# Patient Record
Sex: Female | Born: 1945 | Race: White | Hispanic: No | Marital: Married | Smoking: Never smoker
Health system: Southern US, Community
[De-identification: ages and names within clinical notes are randomized; demographics above are authoritative.]

## PROBLEM LIST (undated history)

## (undated) ENCOUNTER — Emergency Department (HOSPITAL_BASED_OUTPATIENT_CLINIC_OR_DEPARTMENT_OTHER): Admission: EM | Payer: Medicare Other

## (undated) DIAGNOSIS — D649 Anemia, unspecified: Secondary | ICD-10-CM

## (undated) DIAGNOSIS — H409 Unspecified glaucoma: Secondary | ICD-10-CM

## (undated) DIAGNOSIS — E785 Hyperlipidemia, unspecified: Secondary | ICD-10-CM

## (undated) DIAGNOSIS — I10 Essential (primary) hypertension: Secondary | ICD-10-CM

## (undated) DIAGNOSIS — E079 Disorder of thyroid, unspecified: Secondary | ICD-10-CM

## (undated) DIAGNOSIS — D126 Benign neoplasm of colon, unspecified: Secondary | ICD-10-CM

## (undated) DIAGNOSIS — T7840XA Allergy, unspecified, initial encounter: Secondary | ICD-10-CM

## (undated) DIAGNOSIS — Z974 Presence of external hearing-aid: Secondary | ICD-10-CM

## (undated) DIAGNOSIS — N189 Chronic kidney disease, unspecified: Secondary | ICD-10-CM

## (undated) DIAGNOSIS — Z8619 Personal history of other infectious and parasitic diseases: Secondary | ICD-10-CM

## (undated) DIAGNOSIS — R87629 Unspecified abnormal cytological findings in specimens from vagina: Secondary | ICD-10-CM

## (undated) DIAGNOSIS — C801 Malignant (primary) neoplasm, unspecified: Secondary | ICD-10-CM

## (undated) DIAGNOSIS — H903 Sensorineural hearing loss, bilateral: Secondary | ICD-10-CM

## (undated) DIAGNOSIS — M810 Age-related osteoporosis without current pathological fracture: Secondary | ICD-10-CM

## (undated) DIAGNOSIS — M1990 Unspecified osteoarthritis, unspecified site: Secondary | ICD-10-CM

## (undated) DIAGNOSIS — M6530 Trigger finger, unspecified finger: Secondary | ICD-10-CM

## (undated) HISTORY — DX: Benign neoplasm of colon, unspecified: D12.6

## (undated) HISTORY — DX: Age-related osteoporosis without current pathological fracture: M81.0

## (undated) HISTORY — PX: COLPOSCOPY: SHX161

## (undated) HISTORY — DX: Personal history of other infectious and parasitic diseases: Z86.19

## (undated) HISTORY — DX: Unspecified glaucoma: H40.9

## (undated) HISTORY — PX: EYE SURGERY: SHX253

## (undated) HISTORY — DX: Malignant (primary) neoplasm, unspecified: C80.1

## (undated) HISTORY — DX: Trigger finger, unspecified finger: M65.30

## (undated) HISTORY — DX: Allergy, unspecified, initial encounter: T78.40XA

## (undated) HISTORY — DX: Unspecified abnormal cytological findings in specimens from vagina: R87.629

## (undated) HISTORY — DX: Presence of external hearing-aid: Z97.4

## (undated) HISTORY — PX: TUBAL LIGATION: SHX77

## (undated) HISTORY — DX: Unspecified osteoarthritis, unspecified site: M19.90

## (undated) HISTORY — DX: Chronic kidney disease, unspecified: N18.9

## (undated) HISTORY — DX: Hyperlipidemia, unspecified: E78.5

## (undated) HISTORY — DX: Anemia, unspecified: D64.9

## (undated) HISTORY — DX: Sensorineural hearing loss, bilateral: H90.3

## (undated) HISTORY — DX: Essential (primary) hypertension: I10

## (undated) HISTORY — DX: Disorder of thyroid, unspecified: E07.9

---
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
## 1978-02-26 DIAGNOSIS — E079 Disorder of thyroid, unspecified: Secondary | ICD-10-CM

## 1978-02-26 HISTORY — DX: Disorder of thyroid, unspecified: E07.9

## 1988-02-27 HISTORY — PX: BREAST SURGERY: SHX581

## 1993-02-26 DIAGNOSIS — C801 Malignant (primary) neoplasm, unspecified: Secondary | ICD-10-CM

## 1993-02-26 HISTORY — DX: Malignant (primary) neoplasm, unspecified: C80.1

## 2010-06-30 DIAGNOSIS — L280 Lichen simplex chronicus: Secondary | ICD-10-CM | POA: Insufficient documentation

## 2011-07-04 DIAGNOSIS — M129 Arthropathy, unspecified: Secondary | ICD-10-CM | POA: Insufficient documentation

## 2012-01-01 DIAGNOSIS — Z8742 Personal history of other diseases of the female genital tract: Secondary | ICD-10-CM | POA: Insufficient documentation

## 2013-06-17 DIAGNOSIS — H409 Unspecified glaucoma: Secondary | ICD-10-CM | POA: Insufficient documentation

## 2013-06-17 DIAGNOSIS — Z9109 Other allergy status, other than to drugs and biological substances: Secondary | ICD-10-CM | POA: Insufficient documentation

## 2013-06-17 DIAGNOSIS — H9190 Unspecified hearing loss, unspecified ear: Secondary | ICD-10-CM | POA: Insufficient documentation

## 2014-06-16 DIAGNOSIS — H4011X1 Primary open-angle glaucoma, mild stage: Secondary | ICD-10-CM | POA: Diagnosis not present

## 2014-06-28 DIAGNOSIS — J0100 Acute maxillary sinusitis, unspecified: Secondary | ICD-10-CM | POA: Diagnosis not present

## 2014-08-03 DIAGNOSIS — M8580 Other specified disorders of bone density and structure, unspecified site: Secondary | ICD-10-CM | POA: Diagnosis not present

## 2014-08-03 DIAGNOSIS — E782 Mixed hyperlipidemia: Secondary | ICD-10-CM | POA: Diagnosis not present

## 2014-08-03 DIAGNOSIS — M899 Disorder of bone, unspecified: Secondary | ICD-10-CM | POA: Diagnosis not present

## 2014-08-03 DIAGNOSIS — E038 Other specified hypothyroidism: Secondary | ICD-10-CM | POA: Diagnosis not present

## 2014-08-03 DIAGNOSIS — I10 Essential (primary) hypertension: Secondary | ICD-10-CM | POA: Diagnosis not present

## 2014-10-18 DIAGNOSIS — H4011X1 Primary open-angle glaucoma, mild stage: Secondary | ICD-10-CM | POA: Diagnosis not present

## 2015-01-19 DIAGNOSIS — J020 Streptococcal pharyngitis: Secondary | ICD-10-CM | POA: Diagnosis not present

## 2015-02-01 DIAGNOSIS — E559 Vitamin D deficiency, unspecified: Secondary | ICD-10-CM | POA: Diagnosis not present

## 2015-02-01 DIAGNOSIS — E785 Hyperlipidemia, unspecified: Secondary | ICD-10-CM | POA: Diagnosis not present

## 2015-02-01 DIAGNOSIS — E039 Hypothyroidism, unspecified: Secondary | ICD-10-CM | POA: Diagnosis not present

## 2015-02-01 DIAGNOSIS — Z130 Encounter for screening for diseases of the blood and blood-forming organs and certain disorders involving the immune mechanism: Secondary | ICD-10-CM | POA: Diagnosis not present

## 2015-02-01 DIAGNOSIS — I10 Essential (primary) hypertension: Secondary | ICD-10-CM | POA: Diagnosis not present

## 2015-02-08 DIAGNOSIS — I10 Essential (primary) hypertension: Secondary | ICD-10-CM | POA: Diagnosis not present

## 2015-02-08 DIAGNOSIS — H401131 Primary open-angle glaucoma, bilateral, mild stage: Secondary | ICD-10-CM | POA: Diagnosis not present

## 2015-02-08 DIAGNOSIS — E038 Other specified hypothyroidism: Secondary | ICD-10-CM | POA: Diagnosis not present

## 2015-02-08 DIAGNOSIS — E782 Mixed hyperlipidemia: Secondary | ICD-10-CM | POA: Diagnosis not present

## 2015-02-27 DIAGNOSIS — H903 Sensorineural hearing loss, bilateral: Secondary | ICD-10-CM

## 2015-02-27 HISTORY — DX: Sensorineural hearing loss, bilateral: H90.3

## 2015-03-02 ENCOUNTER — Ambulatory Visit (INDEPENDENT_AMBULATORY_CARE_PROVIDER_SITE_OTHER): Payer: Medicare Other | Admitting: Family Medicine

## 2015-03-02 ENCOUNTER — Encounter: Payer: Self-pay | Admitting: Family Medicine

## 2015-03-02 VITALS — BP 147/77 | HR 74 | Temp 98.7°F | Resp 20 | Ht 61.0 in | Wt 123.2 lb

## 2015-03-02 DIAGNOSIS — Z7189 Other specified counseling: Secondary | ICD-10-CM

## 2015-03-02 DIAGNOSIS — I10 Essential (primary) hypertension: Secondary | ICD-10-CM | POA: Diagnosis not present

## 2015-03-02 DIAGNOSIS — Z7689 Persons encountering health services in other specified circumstances: Secondary | ICD-10-CM | POA: Insufficient documentation

## 2015-03-02 DIAGNOSIS — E039 Hypothyroidism, unspecified: Secondary | ICD-10-CM | POA: Insufficient documentation

## 2015-03-02 DIAGNOSIS — E785 Hyperlipidemia, unspecified: Secondary | ICD-10-CM | POA: Diagnosis not present

## 2015-03-02 DIAGNOSIS — E781 Pure hyperglyceridemia: Secondary | ICD-10-CM

## 2015-03-02 DIAGNOSIS — Z639 Problem related to primary support group, unspecified: Secondary | ICD-10-CM | POA: Insufficient documentation

## 2015-03-02 NOTE — Progress Notes (Signed)
Subjective:    Patient ID: Cynthia Powell, female    DOB: 1945/09/18, 70 y.o.   MRN: [id number]  HPI  Patient presents for new patient establishment. All past medical history, surgical history, allergies, family history, immunizations and social history was obtained from the patient today and entered into the electronic medical record. Records are requested from her prior PCP, and will be reviewed at the time they are received. All medical records will be updated at that time.  Patient reports she has a history of hypertension, hyperlipidemia and hypothyroid. She reports compliance with medications for her hypertension, states that she has white coat syndrome. She does not take a statin medication for her hyperlipidemia, she does take krill oil daily. She refuses statin medication. She is curious if fenofibrate would be helpful for her. Her husband is on this medication. She reports compliance with her Synthroid daily. She states all lab work for the ear was completed in December 2016, these records have been requested.  Patient states she does have a family dynamics that causes her some stress. Her youngest daughter has a demyelinating mental condition which can cause her to be difficult to handle at times. She states she has been unstable, unable to hold down jobs in the past. She now lives about a block away from them, in a house that they bought for her. Her daughter does have a young child, age 51, Alexa, take care for quite frequently.  Health maintenance:  Colonoscopy: Dr. Ivin Booty, [facility], h/o polyps; 2014 last completed. Requested records. Mammogram: Patient states up-to-date, she gets one every 2 years, always normal, with the exception of left benign breast milk duct tumor and right breast cyst in the past. Records requested. Cervical cancer screening: 2007, has gyn, does not desire f/u despite h/o of cervical cancer in the past with LEEP.  Immunizations: zoster completed, PNA UTD, FLU UTD,  Tetanus 2011, up-to-date. Infectious disease screening: HIV completed.  Hep C unknown screening.   Past Medical History  Diagnosis Date  . Thyroid disease   . Hypertension   . Hyperlipidemia   . Allergy     seasonal and food  . Arthritis   . Glaucoma    Allergies  Allergen Reactions  . Iodinated Diagnostic Agents     Other reaction(s): UNCONSCIOUSNESS   Past Surgical History  Procedure Laterality Date  . Tonsillectomy    . Cesarean section    . Breast surgery      biopsy  . Colposcopy     Family History  Problem Relation Age of Onset  . Arthritis Mother   . Diabetes Mother   . Cancer Father   . Heart disease Father   . Cancer Sister   . Cancer Daughter   . Diabetes Daughter   . Mental retardation Daughter   . Hearing loss Maternal Grandfather    Social History   Social History  . Marital Status: Married    Spouse Name: N/A  . Number of Children: N/A  . Years of Education: N/A   Occupational History  . Not on file.   Social History Main Topics  . Smoking status: Never Smoker   . Smokeless tobacco: Not on file  . Alcohol Use: No  . Drug Use: No  . Sexual Activity: No   Other Topics Concern  . Not on file   Social History Narrative  . No narrative on file     Review of Systems Negative, with the exception of above mentioned in HPI  Objective:   Physical Exam  BP 147/77 mmHg  Pulse 74  Temp(Src) 98.7 F (37.1 C)  Resp 20  Ht 5\' 1"  (1.549 m)  Wt 123 lb 4 oz (55.906 kg)  BMI 23.30 kg/m2  SpO2 99% Gen: Afebrile. No acute distress. Nontoxic in appearance, well-developed, well-nourished, Caucasian female. Pleasant. HENT: AT. [place]. Bilateral TM visualized and normal in appearance. MMM. Bilateral nares without erythema or swelling. Throat without erythema or exudates. No cough on exam, no hoarseness on exam. Good dentition. Eyes:Pupils Equal Round Reactive to light, Extraocular movements intact,  Conjunctiva without redness, discharge or  icterus. Neck/lymp/endocrine: Supple, no lymphadenopathy, no thyromegaly CV: RRR no murmur, no edema, +2/4 P posterior tibialis pulses Chest: CTAB, no wheeze or crackles. Good air movement, normal respiratory effort Abd: Soft. Flat. NTND. BS present. No Masses palpated.  MSK: No obvious deformities, no joint swelling, no erythema, or range of motion. Skin: No rashes, purpura or petechiae.  Neuro:  Normal gait. PERLA. EOMi. Alert. Oriented x3 Cranial nerves II through XII intact. Muscle strength 5/5 upper and lower extremity.  Psych: Normal affect, dress and demeanor. Normal speech. Normal thought content and judgment     Assessment & Plan:  Cynthia Powell is a 70 y.o. female presents with establishment of care.  Hypothyroidism, unspecified hypothyroidism type - Patient reports all of her lab work which was completed in December, and her TSH was normal. We have requested records from her prior PCP. Continue Synthroid at current dose, patient states she does not need refills at this time.  Essential hypertension, benign - Patient to monitor her blood pressure in the outpatient setting, she states she has white coat syndrome and has elevated blood pressure went to doctors or dentist office. If above 140/80 patient is to return to the office sooner to discuss blood pressure regimen.  Hyperlipidemia/Hypertriglyceridemia - Family history of heart disease in her father - Awaiting patient records, once received will evaluate benefit/dose of fenofibrate. Patient is interested in possibly starting this medication if it will be beneficial. She has been intolerant to statins and refuses to try other formulations. She does take krill oil, have encouraged her to continue taking this medication.  Family dynamics problem: -Patient states she does have a family dynamics problem that causes her some stress. Her youngest daughter has a demyelinating mental condition which can cause her to be difficult to handle at  times. She states she has been unstable, unable to hold down jobs in the past. She now lives about a block away from them, in a house that they bought for her. Her daughter does have a young child, age 51, Alexa, take care for quite frequently.  Home care maintenance colonoscopy: Dr. Ivin Booty, [facility], h/o polyps; 2014 last completed. Requested records. Mammogram: Patient states up-to-date, she gets one every 2 years, always normal, with the exception of left benign breast milk duct tumor and right breast cyst in the past. Records requested. Cervical cancer screening: 2007, has gyn, does not desire f/u despite h/o of cervical cancer in the past with LEEP.  Immunizations: zoster completed, PNA UTD, FLU UTD, Tetanus 2011, up-to-date. Infectious disease screening: HIV completed.  Hep C unknown screening.   - 6 months follow up, unless needed sooner

## 2015-03-02 NOTE — Patient Instructions (Addendum)
Health Maintenance, Female Adopting a healthy lifestyle and getting preventive care can go a long way to promote health and wellness. Talk with your health care provider about what schedule of regular examinations is right for you. This is a good chance for you to check in with your provider about disease prevention and staying healthy. In between checkups, there are plenty of things you can do on your own. Experts have done a lot of research about which lifestyle changes and preventive measures are most likely to keep you healthy. Ask your health care provider for more information. WEIGHT AND DIET  Eat a healthy diet  Be sure to include plenty of vegetables, fruits, low-fat dairy products, and lean protein.  Do not eat a lot of foods high in solid fats, added sugars, or salt.  Get regular exercise. This is one of the most important things you can do for your health.  Most adults should exercise for at least 150 minutes each week. The exercise should increase your heart rate and make you sweat (moderate-intensity exercise).  Most adults should also do strengthening exercises at least twice a week. This is in addition to the moderate-intensity exercise.  Maintain a healthy weight  Body mass index (BMI) is a measurement that can be used to identify possible weight problems. It estimates body fat based on height and weight. Your health care provider can help determine your BMI and help you achieve or maintain a healthy weight.  For females 20 years of age and older:   A BMI below 18.5 is considered underweight.  A BMI of 18.5 to 24.9 is normal.  A BMI of 25 to 29.9 is considered overweight.  A BMI of 30 and above is considered obese.  Watch levels of cholesterol and blood lipids  You should start having your blood tested for lipids and cholesterol at 72 years of age, then have this test every 5 years.  You may need to have your cholesterol levels checked more often if:  Your lipid  or cholesterol levels are high.  You are older than 72 years of age.  You are at high risk for heart disease.  CANCER SCREENING   Lung Cancer  Lung cancer screening is recommended for adults 55-80 years old who are at high risk for lung cancer because of a history of smoking.  A yearly low-dose CT scan of the lungs is recommended for people who:  Currently smoke.  Have quit within the past 15 years.  Have at least a 30-pack-year history of smoking. A pack year is smoking an average of one pack of cigarettes a day for 1 year.  Yearly screening should continue until it has been 15 years since you quit.  Yearly screening should stop if you develop a health problem that would prevent you from having lung cancer treatment.  Breast Cancer  Practice breast self-awareness. This means understanding how your breasts normally appear and feel.  It also means doing regular breast self-exams. Let your health care provider know about any changes, no matter how small.  If you are in your 20s or 30s, you should have a clinical breast exam (CBE) by a health care provider every 1-3 years as part of a regular health exam.  If you are 40 or older, have a CBE every year. Also consider having a breast X-ray (mammogram) every year.  If you have a family history of breast cancer, talk to your health care provider about genetic screening.  If you   are at high risk for breast cancer, talk to your health care provider about having an MRI and a mammogram every year.  Breast cancer gene (BRCA) assessment is recommended for women who have family members with BRCA-related cancers. BRCA-related cancers include:  Breast.  Ovarian.  Tubal.  Peritoneal cancers.  Results of the assessment will determine the need for genetic counseling and BRCA1 and BRCA2 testing. Cervical Cancer Your health care provider may recommend that you be screened regularly for cancer of the pelvic organs (ovaries, uterus, and  vagina). This screening involves a pelvic examination, including checking for microscopic changes to the surface of your cervix (Pap test). You may be encouraged to have this screening done every 3 years, beginning at age 21.  For women ages 30-65, health care providers may recommend pelvic exams and Pap testing every 3 years, or they may recommend the Pap and pelvic exam, combined with testing for human papilloma virus (HPV), every 5 years. Some types of HPV increase your risk of cervical cancer. Testing for HPV may also be done on women of any age with unclear Pap test results.  Other health care providers may not recommend any screening for nonpregnant women who are considered low risk for pelvic cancer and who do not have symptoms. Ask your health care provider if a screening pelvic exam is right for you.  If you have had past treatment for cervical cancer or a condition that could lead to cancer, you need Pap tests and screening for cancer for at least 20 years after your treatment. If Pap tests have been discontinued, your risk factors (such as having a new sexual partner) need to be reassessed to determine if screening should resume. Some women have medical problems that increase the chance of getting cervical cancer. In these cases, your health care provider may recommend more frequent screening and Pap tests. Colorectal Cancer  This type of cancer can be detected and often prevented.  Routine colorectal cancer screening usually begins at 72 years of age and continues through 72 years of age.  Your health care provider may recommend screening at an earlier age if you have risk factors for colon cancer.  Your health care provider may also recommend using home test kits to check for hidden blood in the stool.  A small camera at the end of a tube can be used to examine your colon directly (sigmoidoscopy or colonoscopy). This is done to check for the earliest forms of colorectal  cancer.  Routine screening usually begins at age 50.  Direct examination of the colon should be repeated every 5-10 years through 72 years of age. However, you may need to be screened more often if early forms of precancerous polyps or small growths are found. Skin Cancer  Check your skin from head to toe regularly.  Tell your health care provider about any new moles or changes in moles, especially if there is a change in a mole's shape or color.  Also tell your health care provider if you have a mole that is larger than the size of a pencil eraser.  Always use sunscreen. Apply sunscreen liberally and repeatedly throughout the day.  Protect yourself by wearing long sleeves, pants, a wide-brimmed hat, and sunglasses whenever you are outside. HEART DISEASE, DIABETES, AND HIGH BLOOD PRESSURE   High blood pressure causes heart disease and increases the risk of stroke. High blood pressure is more likely to develop in:  People who have blood pressure in the high end   of the normal range (130-139/85-89 mm Hg).  People who are overweight or obese.  People who are African American.  If you are 38-23 years of age, have your blood pressure checked every 3-5 years. If you are 61 years of age or older, have your blood pressure checked every year. You should have your blood pressure measured twice--once when you are at a hospital or clinic, and once when you are not at a hospital or clinic. Record the average of the two measurements. To check your blood pressure when you are not at a hospital or clinic, you can use:  An automated blood pressure machine at a pharmacy.  A home blood pressure monitor.  If you are between 45 years and 39 years old, ask your health care provider if you should take aspirin to prevent strokes.  Have regular diabetes screenings. This involves taking a blood sample to check your fasting blood sugar level.  If you are at a normal weight and have a low risk for diabetes,  have this test once every three years after 72 years of age.  If you are overweight and have a high risk for diabetes, consider being tested at a younger age or more often. PREVENTING INFECTION  Hepatitis B  If you have a higher risk for hepatitis B, you should be screened for this virus. You are considered at high risk for hepatitis B if:  You were born in a country where hepatitis B is common. Ask your health care provider which countries are considered high risk.  Your parents were born in a high-risk country, and you have not been immunized against hepatitis B (hepatitis B vaccine).  You have HIV or AIDS.  You use needles to inject street drugs.  You live with someone who has hepatitis B.  You have had sex with someone who has hepatitis B.  You get hemodialysis treatment.  You take certain medicines for conditions, including cancer, organ transplantation, and autoimmune conditions. Hepatitis C  Blood testing is recommended for:  Everyone born from [birth year] through 1967.  Anyone with known risk factors for hepatitis C. Sexually transmitted infections (STIs)  You should be screened for sexually transmitted infections (STIs) including gonorrhea and chlamydia if:  You are sexually active and are younger than 72 years of age.  You are older than 72 years of age and your health care provider tells you that you are at risk for this type of infection.  Your sexual activity has changed since you were last screened and you are at an increased risk for chlamydia or gonorrhea. Ask your health care provider if you are at risk.  If you do not have HIV, but are at risk, it may be recommended that you take a prescription medicine daily to prevent HIV infection. This is called pre-exposure prophylaxis (PrEP). You are considered at risk if:  You are sexually active and do not regularly use condoms or know the HIV status of your partner(s).  You take drugs by injection.  You are sexually  active with a partner who has HIV. Talk with your health care provider about whether you are at high risk of being infected with HIV. If you choose to begin PrEP, you should first be tested for HIV. You should then be tested every 3 months for as long as you are taking PrEP.  PREGNANCY   If you are premenopausal and you may become pregnant, ask your health care provider about preconception counseling.  If you may  become pregnant, take 400 to 800 micrograms (mcg) of folic acid every day.  If you want to prevent pregnancy, talk to your health care provider about birth control (contraception). OSTEOPOROSIS AND MENOPAUSE   Osteoporosis is a disease in which the bones lose minerals and strength with aging. This can result in serious bone fractures. Your risk for osteoporosis can be identified using a bone density scan.  If you are 75 years of age or older, or if you are at risk for osteoporosis and fractures, ask your health care provider if you should be screened.  Ask your health care provider whether you should take a calcium or vitamin D supplement to lower your risk for osteoporosis.  Menopause may have certain physical symptoms and risks.  Hormone replacement therapy may reduce some of these symptoms and risks. Talk to your health care provider about whether hormone replacement therapy is right for you.  HOME CARE INSTRUCTIONS   Schedule regular health, dental, and eye exams.  Stay current with your immunizations.   Do not use any tobacco products including cigarettes, chewing tobacco, or electronic cigarettes.  If you are pregnant, do not drink alcohol.  If you are breastfeeding, limit how much and how often you drink alcohol.  Limit alcohol intake to no more than 1 drink per day for nonpregnant women. One drink equals 12 ounces of beer, 5 ounces of wine, or 1 ounces of hard liquor.  Do not use street drugs.  Do not share needles.  Ask your health care provider for help if  you need support or information about quitting drugs.  Tell your health care provider if you often feel depressed.  Tell your health care provider if you have ever been abused or do not feel safe at home.   This information is not intended to replace advice given to you by your health care provider. Make sure you discuss any questions you have with your health care provider.   Document Released: 08/28/2010 Document Revised: 03/05/2014 Document Reviewed: 01/14/2013 Elsevier Interactive Patient Education 2016 [place] Vit d supplement.  6 month follow up for BP

## 2015-03-08 ENCOUNTER — Encounter: Payer: Self-pay | Admitting: Family Medicine

## 2015-03-08 DIAGNOSIS — Z974 Presence of external hearing-aid: Secondary | ICD-10-CM | POA: Insufficient documentation

## 2015-03-08 DIAGNOSIS — M810 Age-related osteoporosis without current pathological fracture: Secondary | ICD-10-CM | POA: Insufficient documentation

## 2015-06-20 DIAGNOSIS — H401131 Primary open-angle glaucoma, bilateral, mild stage: Secondary | ICD-10-CM | POA: Diagnosis not present

## 2015-08-31 ENCOUNTER — Encounter: Payer: Self-pay | Admitting: Family Medicine

## 2015-08-31 ENCOUNTER — Telehealth: Payer: Self-pay | Admitting: Family Medicine

## 2015-08-31 ENCOUNTER — Ambulatory Visit (INDEPENDENT_AMBULATORY_CARE_PROVIDER_SITE_OTHER): Payer: Medicare Other | Admitting: Family Medicine

## 2015-08-31 VITALS — BP 133/81 | HR 78 | Temp 98.5°F | Resp 20 | Ht 61.0 in | Wt 122.8 lb

## 2015-08-31 DIAGNOSIS — E039 Hypothyroidism, unspecified: Secondary | ICD-10-CM | POA: Diagnosis not present

## 2015-08-31 DIAGNOSIS — I10 Essential (primary) hypertension: Secondary | ICD-10-CM | POA: Diagnosis not present

## 2015-08-31 DIAGNOSIS — L609 Nail disorder, unspecified: Secondary | ICD-10-CM | POA: Diagnosis not present

## 2015-08-31 DIAGNOSIS — L608 Other nail disorders: Secondary | ICD-10-CM | POA: Insufficient documentation

## 2015-08-31 LAB — TSH: TSH: 0.46 u[IU]/mL (ref 0.35–4.50)

## 2015-08-31 MED ORDER — LEVOTHYROXINE SODIUM 88 MCG PO TABS: 88.0000 ug | ORAL_TABLET | Freq: Every day | ORAL | Status: DC

## 2015-08-31 MED ORDER — LISINOPRIL 10 MG PO TABS: 10.0000 mg | ORAL_TABLET | Freq: Every day | ORAL | Status: DC

## 2015-08-31 NOTE — Progress Notes (Signed)
Patient ID: Cynthia Powell, female   DOB: 09-12-1945, 70 y.o.   MRN: [id number]   Subjective:    Patient ID: Cynthia Powell, female    DOB: January 27, 1946, 70 y.o.   MRN: [id number]  HPI  Hypertension:Patient reports compliance with her  lisinopril 10 mg QD. She will take BP sporadically at home, especially if having headache, highest reported 138/84. She denies LE, chest pain, or shortness of breath.   Hypothyroid: Pt reports compliance with 88 mcg synthroid daily on an empty stomach. She reports a normal TSH in December at another location, no records have been received. She will be due for refills within the next month.  She denies diarrhea, constipation, flushing or fatigue.   Toe nail changes: Pt states she has noticed her large toenails are yellow. She does not wear nail polish. She denies known trauma/changes in shoes etc prior to changes. She noticed the changes in March (>3 months ago) and has been putting an OTC solution and soak on her feet for fungus. She has  Not noticed any difference or improvement with this treatment. She felt it might had been secondary to her wearing soaks all the time, but she states she always has on clean soaks and she has been trying to go barefoot when able to allow her toenails to breath.  Past Medical History  Diagnosis Date  . Thyroid disease 1980  . Hypertension   . Hyperlipidemia     using Krill oil; refuses statin  . Allergy     seasonal and food  . Arthritis     osteoarthritis  . Glaucoma   . 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 History of chickenpox   . History of shingles     above her right knee, gets frequently.  . Osteoporosis   . Trigger finger   . Benign neoplasm of colon   . Abnormal Pap smear of vagina 1995,2007    pt had h/o abnl pap; does not desire future screening/PAP   Allergies  Allergen Reactions  . Iodinated Diagnostic Agents     Other reaction(s): UNCONSCIOUSNESS   Past Surgical History  Procedure Laterality Date  .  Tonsillectomy  1954  . Cesarean section    . Breast surgery  1990    biopsy  . Colposcopy     Family History  Problem Relation Age of Onset  . Arthritis Mother   . Diabetes Mother   . Cancer Father   . Heart disease Father   . Breast cancer Sister     breast cancer  . Melanoma Daughter     melanoma 2009  . Diabetes Daughter   . Mental retardation Daughter   . Hearing loss Maternal Grandfather   . Stroke Mother   . Hypertension Mother   . Depression Daughter     second daughter  . OCD Daughter     second daughter  . Hypertension Father   . Pernicious anemia Father    Social History   Social History  . Marital Status: Married    Spouse Name: N/A  . Number of Children: N/A  . Years of Education: N/A   Occupational History  . Not on file.   Social History Main Topics  . Smoking status: Never Smoker   . Smokeless tobacco: Not on file  . Alcohol Use: No  . Drug Use: No  . Sexual Activity: No   Other Topics Concern  . Not on file   Social History Narrative  Review of Systems Negative, with the exception of above mentioned in HPI     Objective:   Physical Exam  BP 133/81 mmHg  Pulse 78  Temp(Src) 98.5 F (36.9 C)  Resp 20  Ht 5\' 1"  (1.549 m)  Wt 122 lb 12 oz (55.679 kg)  BMI 23.21 kg/m2  SpO2 99% Gen: Afebrile. No acute distress. Nontoxic in appearance, well-developed, well-nourished, Caucasian female. Pleasant. HENT: AT. [place].  MMM. Eyes:Pupils Equal Round Reactive to light, Extraocular movements intact,  Conjunctiva without redness, discharge or icterus. Neck/lymp/endocrine: Supple, no lymphadenopathy, no thyromegaly CV: RRR no murmur, no edema, +2/4 P posterior tibialis pulses Chest: CTAB, no wheeze or crackles.  Abd: Soft. Flat. NTND. BS present. No Masses palpated.  Skin: No rashes, purpura or petechiae. Bilateral large toenails yellow thick raised, angular deformity R>L at cuticle. No erythema or swelling.  Neuro:  Normal gait. PERLA. EOMi.  Alert. Oriented x3  Psych: Normal affect, dress and demeanor. Normal speech. Normal thought content and judgment    Assessment & Plan:  Cynthia Powell is a 70 y.o. female presents for follow up OV for chronic medical conditions with acute complaint.  Hypothyroidism, unspecified hypothyroidism type - Continue Synthroid at current dose. - TSH collected today, and dose will be adjusted if needed/refills prescribed once resulted  - follow yearly.   Essential hypertension, benign - Stable, controlled today.  - Continue lisinopril, refills provided - low salt diet. Exercise > 150 m a week.  - f/u every 6 months.   Toenail deformity:  - new.  - Bilateral toenail yellow/thickened with deformity.  - ? Fungus vs nailbed injury/nail growth under current toe nail. Has been non-responisve to OTC fungus treatment.  - Podiatry referral.   > 25 minutes spent with patient, >50% of time spent face to face counseling patient and coordinating care.   Electronically Signed by: Howard Pouch, DO [facility]

## 2015-08-31 NOTE — Patient Instructions (Signed)
Preventing Toenail Fungus from Recurring   Sanitize your shoes with Mycomist spray or a similar shoe sanitizer spray.  Follow the instructions on the bottle and dry them outside in the sun or with a hairdryer.  We also recommend repeating the sanitization once weekly in shoes you wear most often.   Throw away any shoes you have worn a significant amount without socks-fungus thrives in a warm moist environment and you want to avoid re-infection after your laser procedure   Bleach your socks with regular or color safe bleach   Change your socks regularly to keep your feet clean and dry (especially if you have sweaty feet)-if sweaty feet are a problem, let your doctor know-there is a great lotion that helps with this problem.   Clean your toenail clippers with alcohol before you use them if you do your own toenails and make sure to replace [employer] and orange sticks regularly   Podiatry referral placed today.  Follow on other acute problems if needed.    If you get regular pedicures, bring your own instruments or go to a spa that sterilizes their instruments in an autoclave.

## 2015-08-31 NOTE — Telephone Encounter (Signed)
Please call pt: Her Thyroid is functioning normal. I have called in refills on her medication.

## 2015-09-01 NOTE — Telephone Encounter (Signed)
Left message with results and information on patient voice mail. 

## 2015-09-12 ENCOUNTER — Ambulatory Visit (INDEPENDENT_AMBULATORY_CARE_PROVIDER_SITE_OTHER): Payer: Medicare Other | Admitting: Family Medicine

## 2015-09-12 ENCOUNTER — Encounter: Payer: Self-pay | Admitting: Family Medicine

## 2015-09-12 VITALS — BP 123/83 | HR 71 | Temp 99.3°F | Resp 20 | Ht 61.0 in | Wt 122.8 lb

## 2015-09-12 DIAGNOSIS — I8393 Asymptomatic varicose veins of bilateral lower extremities: Secondary | ICD-10-CM | POA: Diagnosis not present

## 2015-09-12 DIAGNOSIS — G479 Sleep disorder, unspecified: Secondary | ICD-10-CM | POA: Diagnosis not present

## 2015-09-12 HISTORY — DX: Asymptomatic varicose veins of bilateral lower extremities: I83.93

## 2015-09-12 NOTE — Patient Instructions (Signed)
Varicose Veins Varicose veins are veins that have become enlarged and twisted. They are usually seen in the legs but can occur in other parts of the body as well. CAUSES This condition is the result of valves in the veins not working properly. Valves in the veins help to return blood from the leg to the heart. If these valves are damaged, blood flows backward and backs up into the veins in the leg near the skin. This causes the veins to become larger. RISK FACTORS People who are on their feet a lot, who are pregnant, or who are overweight are more likely to develop varicose veins. SIGNS AND SYMPTOMS  Bulging, twisted-appearing, bluish veins, most commonly found on the legs.  Leg pain or a feeling of heaviness. These symptoms may be worse at the end of the day.  Leg swelling.  Changes in skin color. DIAGNOSIS A health care provider can usually diagnose varicose veins by examining your legs. Your health care provider may also recommend an ultrasound of your leg veins. TREATMENT Most varicose veins can be treated at home.However, other treatments are available for people who have persistent symptoms or want to improve the cosmetic appearance of the varicose veins. These treatment options include:  Sclerotherapy. A solution is injected into the vein to close it off.  Laser treatment. A laser is used to heat the vein to close it off.  Radiofrequency vein ablation. An electrical current produced by radio waves is used to close off the vein.  Phlebectomy. The vein is surgically removed through small incisions made over the varicose vein.  Vein ligation and stripping. The vein is surgically removed through incisions made over the varicose vein after the vein has been tied (ligated). HOME CARE INSTRUCTIONS  Do not stand or sit in one position for long periods of time. Do not sit with your legs crossed. Rest with your legs raised during the day.  Wear compression stockings as directed by your  health care provider. These stockings help to prevent blood clots and reduce swelling in your legs.  Do not wear other tight, encircling garments around your legs, pelvis, or waist.  Walk as much as possible to increase blood flow.  Raise the foot of your bed at night with 2-inch blocks.  If you get a cut in the skin over the vein and the vein bleeds, lie down with your leg raised and press on it with a clean cloth until the bleeding stops. Then place a bandage (dressing) on the cut. See your health care provider if it continues to bleed. SEEK MEDICAL CARE IF:  The skin around your ankle starts to break down.  You have pain, redness, tenderness, or hard swelling in your leg over a vein.  You are uncomfortable because of leg pain.   This information is not intended to replace advice given to you by your health care provider. Make sure you discuss any questions you have with your health care provider.   Document Released: 11/22/2004 Document Revised: 03/05/2014 Document Reviewed: 06/30/2013 Elsevier Interactive Patient Education 2016 Elsevier Inc.   Try the sleepy time tea to help with falling asleep quicker. If you decide you need a prescribed medication for this condition please make an appt.

## 2015-09-12 NOTE — Progress Notes (Signed)
Patient ID: Cynthia Powell, female   DOB: Nov 24, 1945, 70 y.o.   MRN: [id number]    Cynthia Powell , 09/13/1945, 70 y.o., female MRN: [id number] Patient Care Team    Relationship Specialty Notifications Start End  Ma Hillock, DO PCP - General Family Medicine  03/02/15     CC: Spider veins Subjective: Pt presents for an acute OV with complaints of veins in her legs that she is concerned about. She states they are not painful or worsening. She was just concerned.   Insomnia: pt states she has not been able to fall asleep secondary to the current family situation. She states she is averaging about 5 hours of sleep a night. She states this is a new problem for her, she has always been able to sleep well. She has tried melatonin but it gave her nightmares.   Allergies  Allergen Reactions  . Iodinated Diagnostic Agents     Other reaction(s): UNCONSCIOUSNESS   Social History  Substance Use Topics  . Smoking status: Never Smoker   . Smokeless tobacco: Not on file  . Alcohol Use: No   Past Medical History  Diagnosis Date  . Thyroid disease 1980  . Hypertension   . Hyperlipidemia     using Krill oil; refuses statin  . Allergy     seasonal and food  . Arthritis     osteoarthritis  . Glaucoma   . Cancer ([place]) 1995    cervical (cone bx)  . History of chickenpox   . History of shingles     above her right knee, gets frequently.  . Osteoporosis   . Trigger finger   . Benign neoplasm of colon   . Abnormal Pap smear of vagina 1995,2007    pt had h/o abnl pap; does not desire future screening/PAP   Past Surgical History  Procedure Laterality Date  . Tonsillectomy  1954  . Cesarean section    . Breast surgery  1990    biopsy  . Colposcopy     Family History  Problem Relation Age of Onset  . Arthritis Mother   . Diabetes Mother   . Cancer Father   . Heart disease Father   . Breast cancer Sister     breast cancer  . Melanoma Daughter     melanoma 2009  . Diabetes Daughter   .  Mental retardation Daughter   . Hearing loss Maternal Grandfather   . Stroke Mother   . Hypertension Mother   . Depression Daughter     second daughter  . OCD Daughter     second daughter  . Hypertension Father   . Pernicious anemia Father      Medication List       This list is accurate as of: 09/12/15  1:47 PM.  Always use your most recent med list.               calcium citrate-vitamin D 315-200 MG-UNIT tablet  Commonly known as:  CITRACAL+D  Take 1 tablet by mouth 2 (two) times daily.     cholecalciferol 1000 units tablet  Commonly known as:  VITAMIN D  Take 5,000 Units by mouth daily.     levothyroxine 88 MCG tablet  Commonly known as:  SYNTHROID, LEVOTHROID  Take 1 tablet (88 mcg total) by mouth daily before breakfast.     lisinopril 10 MG tablet  Commonly known as:  PRINIVIL,ZESTRIL  Take 1 tablet (10 mg total) by mouth daily.  LUMIGAN 0.01 % Soln  Generic drug:  bimatoprost     PROBIOTIC ADVANCED PO  Take by mouth.        No results found for this or any previous visit (from the past 24 hour(s)). No results found.   ROS: Negative, with the exception of above mentioned in HPI   Objective:  BP 123/83 mmHg  Pulse 71  Temp(Src) 99.3 F (37.4 C)  Resp 20  Ht 5\' 1"  (1.549 m)  Wt 122 lb 12.8 oz (55.702 kg)  BMI 23.21 kg/m2  SpO2 96% Body mass index is 23.21 kg/(m^2). Gen: Afebrile. No acute distress. Nontoxic in appearance, well developed, well nourished. Very pleasant female.  HENT: AT. [place]. MMM Eyes:Pupils Equal Round Reactive to light, Extraocular movements intact,  Conjunctiva without redness, discharge or icterus. Skin: no rashes, purpura or petechiae. multiple superficial spider veins bilateral LE. No engorged varicositis.  Neuro:Normal gait. PERLA. EOMi. Alert. Oriented x3  Psych: Normal affect, dress and demeanor. Normal speech. Normal thought content and judgment.  Assessment/Plan: Sasheen Lagrassa is a 70 y.o. female present for acute OV  for   Spider veins of both lower extremities - appears to be benign spider veins by exam. Not painful. Discussed treatment is cosmetic. She is not interested in correcting, she just wanted to make certain they were not something she should worry about.  - F/u As needed.   Difficulty sleeping - situation difficulty sleeping with the stress surrounding the custody of her granddaughter.  - She does not desire medication at this time, and has not tolerated melatonin the past.  - Discussed good sleepy hygiene, chamomile/sleepy time teas.  - pt to call if deciding she needs more coverage. - F/U PRN   electronically signed by:  Howard Pouch, DO  [place]

## 2015-09-15 DIAGNOSIS — Z1231 Encounter for screening mammogram for malignant neoplasm of breast: Secondary | ICD-10-CM | POA: Diagnosis not present

## 2015-09-26 DIAGNOSIS — R928 Other abnormal and inconclusive findings on diagnostic imaging of breast: Secondary | ICD-10-CM | POA: Diagnosis not present

## 2015-10-25 ENCOUNTER — Encounter: Payer: Self-pay | Admitting: Family Medicine

## 2015-10-25 ENCOUNTER — Telehealth: Payer: Self-pay | Admitting: Family Medicine

## 2015-10-25 DIAGNOSIS — L608 Other nail disorders: Secondary | ICD-10-CM

## 2015-10-25 NOTE — Telephone Encounter (Signed)
Podiatry referral placed again. originally placed in July.

## 2015-11-03 ENCOUNTER — Ambulatory Visit (INDEPENDENT_AMBULATORY_CARE_PROVIDER_SITE_OTHER): Payer: Medicare Other | Admitting: Podiatry

## 2015-11-03 ENCOUNTER — Encounter: Payer: Self-pay | Admitting: Podiatry

## 2015-11-03 VITALS — BP 150/80 | HR 74 | Resp 14

## 2015-11-03 DIAGNOSIS — M79676 Pain in unspecified toe(s): Secondary | ICD-10-CM | POA: Diagnosis not present

## 2015-11-03 DIAGNOSIS — B351 Tinea unguium: Secondary | ICD-10-CM | POA: Diagnosis not present

## 2015-11-03 NOTE — Progress Notes (Signed)
   Subjective:    Patient ID: Cynthia Powell, female    DOB: July 30, 1945, 70 y.o.   MRN: [id number]  HPI this patient presents the office with chief complaint of thick, discolored great toenails on both feet. She says that they have been thick for months. She says that she is applying a topical to the nails for the last 6 weeks with no improvement. She states the nails are not painful as she walks and wears her shoes. She presents the office today for an evaluation and treatment of these toenails on both great toes    Review of Systems  All other systems reviewed and are negative.      Objective:   Physical Exam GENERAL APPEARANCE: Alert, conversant. Appropriately groomed. No acute distress.  VASCULAR: Pedal pulses are  palpable at  [facility] and PT bilateral.  Capillary refill time is immediate to all digits,  Normal temperature gradient.  Digital hair growth is present bilateral  NEUROLOGIC: sensation is normal to 5.07 monofilament at 5/5 sites bilateral.  Light touch is intact bilateral, Muscle strength normal.  MUSCULOSKELETAL: acceptable muscle strength, tone and stability bilateral.  Intrinsic muscluature intact bilateral.  Rectus appearance of foot and digits noted bilateral.   DERMATOLOGIC: skin color, texture, and turgor are within normal limits.  No preulcerative lesions or ulcers  are seen, no interdigital maceration noted.  No open lesions present.  . No drainage noted.  NAILS  Thick disfigured discolored nails both great toes both feet.         Assessment & Plan:  Onychomycosis  B/L   IE  Debridement of hallux nails.  Discussed further conservative treatment.  RTC 3 months

## 2015-12-14 DIAGNOSIS — H401131 Primary open-angle glaucoma, bilateral, mild stage: Secondary | ICD-10-CM | POA: Diagnosis not present

## 2016-02-02 ENCOUNTER — Ambulatory Visit (INDEPENDENT_AMBULATORY_CARE_PROVIDER_SITE_OTHER): Payer: Medicare Other | Admitting: Podiatry

## 2016-02-02 ENCOUNTER — Telehealth: Payer: Self-pay | Admitting: Family Medicine

## 2016-02-02 ENCOUNTER — Encounter: Payer: Self-pay | Admitting: Podiatry

## 2016-02-02 VITALS — Ht 61.0 in | Wt 122.0 lb

## 2016-02-02 DIAGNOSIS — B351 Tinea unguium: Secondary | ICD-10-CM | POA: Diagnosis not present

## 2016-02-02 DIAGNOSIS — M79676 Pain in unspecified toe(s): Secondary | ICD-10-CM | POA: Diagnosis not present

## 2016-02-02 DIAGNOSIS — H9193 Unspecified hearing loss, bilateral: Secondary | ICD-10-CM

## 2016-02-02 NOTE — Telephone Encounter (Signed)
Patient is requesting referral to [facility]. She is already a patient & has scheduled an appointment in January 2018 however [facility] needs a referral from her PCP.

## 2016-02-02 NOTE — Progress Notes (Signed)
   Subjective:    Patient ID: Cynthia Powell, female    DOB: 1945-03-02, 70 y.o.   MRN: [id number]  HPI this patient presents the office with chief complaint of thick, discolored great toenails on both feet. She says that they have been thick for months.  She states the nails are  painful as she walks and wears her shoes. She presents the office today for an evaluation and treatment of these toenails on both great toes    Review of Systems  All other systems reviewed and are negative.      Objective:   Physical Exam GENERAL APPEARANCE: Alert, conversant. Appropriately groomed. No acute distress.  VASCULAR: Pedal pulses are  palpable at  [facility] and PT bilateral.  Capillary refill time is immediate to all digits,  Normal temperature gradient.  Digital hair growth is present bilateral  NEUROLOGIC: sensation is normal to 5.07 monofilament at 5/5 sites bilateral.  Light touch is intact bilateral, Muscle strength normal.  MUSCULOSKELETAL: acceptable muscle strength, tone and stability bilateral.  Intrinsic muscluature intact bilateral.  Rectus appearance of foot and digits noted bilateral.   DERMATOLOGIC: skin color, texture, and turgor are within normal limits.  No preulcerative lesions or ulcers  are seen, no interdigital maceration noted.  No open lesions present.  . No drainage noted.  NAILS  Thick disfigured discolored nails both great toes both feet.         Assessment & Plan:  Onychomycosis  B/L   IE  Debridement of hallux nails.  Discussed further conservative treatment.  Prescribed  Formula 3. RTC 3 months

## 2016-02-03 NOTE — Telephone Encounter (Signed)
Referral placed for audiology.

## 2016-03-21 DIAGNOSIS — H9312 Tinnitus, left ear: Secondary | ICD-10-CM | POA: Diagnosis not present

## 2016-03-21 DIAGNOSIS — H903 Sensorineural hearing loss, bilateral: Secondary | ICD-10-CM | POA: Diagnosis not present

## 2016-03-23 DIAGNOSIS — H9312 Tinnitus, left ear: Secondary | ICD-10-CM | POA: Diagnosis not present

## 2016-03-23 DIAGNOSIS — H903 Sensorineural hearing loss, bilateral: Secondary | ICD-10-CM | POA: Diagnosis not present

## 2016-03-27 ENCOUNTER — Other Ambulatory Visit: Payer: Self-pay | Admitting: Family Medicine

## 2016-03-27 ENCOUNTER — Encounter: Payer: Self-pay | Admitting: *Deleted

## 2016-04-16 DIAGNOSIS — H401131 Primary open-angle glaucoma, bilateral, mild stage: Secondary | ICD-10-CM | POA: Diagnosis not present

## 2016-05-03 ENCOUNTER — Encounter: Payer: Self-pay | Admitting: Podiatry

## 2016-05-03 ENCOUNTER — Ambulatory Visit (INDEPENDENT_AMBULATORY_CARE_PROVIDER_SITE_OTHER): Payer: Medicare Other | Admitting: Podiatry

## 2016-05-03 DIAGNOSIS — B351 Tinea unguium: Secondary | ICD-10-CM | POA: Diagnosis not present

## 2016-05-03 DIAGNOSIS — M79676 Pain in unspecified toe(s): Secondary | ICD-10-CM | POA: Diagnosis not present

## 2016-05-03 NOTE — Progress Notes (Signed)
   Subjective:    Patient ID: Cynthia Powell, female    DOB: [birth year], 71 y.o.   MRN: [id number]  HPI this patient presents the office with chief complaint of thick, discolored great toenails on both feet. She says that they have been thick for months.  She states the nails are  painful as she walks and wears her shoes. She presents the office today for an evaluation and treatment of these toenails on both great toes    Review of Systems  All other systems reviewed and are negative.      Objective:   Physical Exam GENERAL APPEARANCE: Alert, conversant. Appropriately groomed. No acute distress.  VASCULAR: Pedal pulses are  palpable at  [facility] and PT bilateral.  Capillary refill time is immediate to all digits,  Normal temperature gradient.  Digital hair growth is present bilateral  NEUROLOGIC: sensation is normal to 5.07 monofilament at 5/5 sites bilateral.  Light touch is intact bilateral, Muscle strength normal.  MUSCULOSKELETAL: acceptable muscle strength, tone and stability bilateral.  Intrinsic muscluature intact bilateral.  Rectus appearance of foot and digits noted bilateral.   DERMATOLOGIC: skin color, texture, and turgor are within normal limits.  No preulcerative lesions or ulcers  are seen, no interdigital maceration noted.  No open lesions present.  . No drainage noted.  NAILS  Thick disfigured discolored nails both great toes both feet.         Assessment & Plan:  Onychomycosis  B/L   IE  Debridement of hallux nails.  Discussed further conservative treatment. Marland Kitchen RTC 3 months    Gardiner Barefoot DPM

## 2016-07-09 DIAGNOSIS — H401131 Primary open-angle glaucoma, bilateral, mild stage: Secondary | ICD-10-CM | POA: Diagnosis not present

## 2016-07-26 ENCOUNTER — Ambulatory Visit: Payer: Medicare Other | Admitting: Podiatry

## 2016-08-26 ENCOUNTER — Other Ambulatory Visit: Payer: Self-pay | Admitting: Family Medicine

## 2016-09-13 ENCOUNTER — Encounter: Payer: Self-pay | Admitting: Family Medicine

## 2016-09-13 DIAGNOSIS — H903 Sensorineural hearing loss, bilateral: Secondary | ICD-10-CM | POA: Insufficient documentation

## 2017-01-21 ENCOUNTER — Encounter: Payer: Self-pay | Admitting: Family Medicine

## 2017-01-21 ENCOUNTER — Ambulatory Visit (INDEPENDENT_AMBULATORY_CARE_PROVIDER_SITE_OTHER): Payer: Medicare Other | Admitting: Family Medicine

## 2017-01-21 VITALS — BP 180/77 | HR 80 | Temp 98.8°F | Resp 20 | Ht 61.0 in | Wt 121.0 lb

## 2017-01-21 DIAGNOSIS — E039 Hypothyroidism, unspecified: Secondary | ICD-10-CM

## 2017-01-21 DIAGNOSIS — E782 Mixed hyperlipidemia: Secondary | ICD-10-CM

## 2017-01-21 DIAGNOSIS — I10 Essential (primary) hypertension: Secondary | ICD-10-CM

## 2017-01-21 LAB — CBC WITH DIFFERENTIAL/PLATELET
BASOS ABS: 0.1 10*3/uL (ref 0.0–0.1)
Basophils Relative: 1.2 % (ref 0.0–3.0)
EOS ABS: 0.2 10*3/uL (ref 0.0–0.7)
Eosinophils Relative: 2.4 % (ref 0.0–5.0)
HEMATOCRIT: 40 % (ref 36.0–46.0)
Hemoglobin: 13.3 g/dL (ref 12.0–15.0)
LYMPHS PCT: 31.9 % (ref 12.0–46.0)
Lymphs Abs: 2.8 10*3/uL (ref 0.7–4.0)
MCHC: 33.3 g/dL (ref 30.0–36.0)
MCV: 86.3 fl (ref 78.0–100.0)
Monocytes Absolute: 0.6 10*3/uL (ref 0.1–1.0)
Monocytes Relative: 6.7 % (ref 3.0–12.0)
NEUTROS ABS: 5.1 10*3/uL (ref 1.4–7.7)
Neutrophils Relative %: 57.8 % (ref 43.0–77.0)
PLATELETS: 251 10*3/uL (ref 150.0–400.0)
RBC: 4.64 Mil/uL (ref 3.87–5.11)
RDW: 12.5 % (ref 11.5–15.5)
WBC: 8.8 10*3/uL (ref 4.0–10.5)

## 2017-01-21 LAB — BASIC METABOLIC PANEL
BUN: 19 mg/dL (ref 6–23)
CHLORIDE: 104 meq/L (ref 96–112)
CO2: 28 mEq/L (ref 19–32)
Calcium: 9.9 mg/dL (ref 8.4–10.5)
Creatinine, Ser: 0.78 mg/dL (ref 0.40–1.20)
GFR: 76.84 mL/min (ref >60.00)
Glucose, Bld: 84 mg/dL (ref 70–99)
POTASSIUM: 4.2 meq/L (ref 3.5–5.1)
Sodium: 142 mEq/L (ref 135–145)

## 2017-01-21 LAB — LDL CHOLESTEROL, DIRECT: LDL DIRECT: 143 mg/dL

## 2017-01-21 LAB — LIPID PANEL
CHOL/HDL RATIO: 6
CHOLESTEROL: 229 mg/dL — AB (ref 0–200)
HDL: 38.9 mg/dL — ABNORMAL LOW (ref >39.00)
NonHDL: 190.42
TRIGLYCERIDES: 239 mg/dL — AB (ref 0.0–149.0)
VLDL: 47.8 mg/dL — AB (ref 0.0–40.0)

## 2017-01-21 LAB — HEMOGLOBIN A1C: Hgb A1c MFr Bld: 5.5 % (ref 4.6–6.5)

## 2017-01-21 LAB — TSH: TSH: 1.07 u[IU]/mL (ref 0.35–4.50)

## 2017-01-21 MED ORDER — LISINOPRIL 10 MG PO TABS: 10.0000 mg | ORAL_TABLET | Freq: Every day | ORAL | 0 refills | Status: DC

## 2017-01-21 NOTE — Progress Notes (Signed)
Cynthia Powell , 11-30-1945, 71 y.o., female MRN: [id number] Patient Care Team    Relationship Specialty Notifications Start End  Ma Hillock, DO PCP - General Family Medicine  03/02/15     Chief Complaint  Patient presents with  . Hypothyroidism  . Hypertension     Subjective:  Hypertension:Patient reports she stopped her lisinopril 10 mg QD. She realizes she is not taking care of herself and is focused on the care of her granddaughter. Patient denies chest pain, shortness of breath or lower extremity edema. Pt does not take daily baby ASA. Pt is not prescribed statin (refuses statin). Diet: does not closely monitor.  Exercise: does not routinely exercise.  RF: HTN, HLD, FHX stroke (mother)  Hypothyroid: Pt reports compliance with 88 mcg synthroid daily on an empty stomach. TSH normal 08/31/2015. She denies palpations, flushing, diarrhea or constipation.   Depression screen [facility] 2/9 01/21/2017 03/02/2015  Decreased Interest 0 0  Down, Depressed, Hopeless 0 0  PHQ - 2 Score 0 0    Allergies  Allergen Reactions  . Iodinated Diagnostic Agents     Other reaction(s): UNCONSCIOUSNESS   Social History   Tobacco Use  . Smoking status: Passive Smoke Exposure - Never Smoker  . Smokeless tobacco: Never Used  Substance Use Topics  . Alcohol use: No   Past Medical History:  Diagnosis Date  . Abnormal Pap smear of vagina 1995,2007   pt had h/o abnl pap; does not desire future screening/PAP  . Allergy    seasonal and food  . Arthritis    osteoarthritis  . Benign neoplasm of colon   . Cancer ([place]) 1995   cervical (cone bx)  . Glaucoma   . History of chickenpox   . History of shingles    above her right knee, gets frequently.  Marland Kitchen Hyperlipidemia    using Krill oil; refuses statin  . Hypertension   . Osteoporosis   . Sensory hearing loss, bilateral 2017   Bilateral hearing aids. [facility]  . Thyroid disease 1980  . Trigger finger   . Wears hearing aid in both ears     Past Surgical History:  Procedure Laterality Date  . BREAST SURGERY  1990   biopsy  . CESAREAN SECTION    . COLPOSCOPY    . TONSILLECTOMY  1954   Family History  Problem Relation Age of Onset  . Arthritis Mother   . Diabetes Mother   . Stroke Mother   . Hypertension Mother   . Cancer Father   . Heart disease Father   . Hypertension Father   . Pernicious anemia Father   . Breast cancer Sister        breast cancer  . Melanoma Daughter        melanoma 2009  . Diabetes Daughter   . Mental retardation Daughter   . Hearing loss Maternal Grandfather   . Depression Daughter        second daughter  . OCD Daughter        second daughter   Allergies as of 01/21/2017      Reactions   Iodinated Diagnostic Agents    Other reaction(s): UNCONSCIOUSNESS      Medication List        Accurate as of 01/21/17  8:53 AM. Always use your most recent med list.          cholecalciferol 1000 units tablet Commonly known as:  VITAMIN D Take 5,000 Units by mouth daily.  levothyroxine 88 MCG tablet Commonly known as:  SYNTHROID, LEVOTHROID Take 1 tablet (88 mcg total) by mouth daily before breakfast.   lisinopril 10 MG tablet Commonly known as:  PRINIVIL,ZESTRIL TAKE 1 TABLET DAILY   LUMIGAN 0.01 % Soln Generic drug:  bimatoprost   PROBIOTIC ADVANCED PO Take by mouth.       All past medical history, surgical history, allergies, family history, immunizations andmedications were updated in the EMR today and reviewed under the history and medication portions of their EMR.     ROS: Negative, with the exception of above mentioned in HPI   Objective:  BP (!) 180/77 (BP Location: Right Arm, Patient Position: Sitting, Cuff Size: Normal)   Pulse 80   Temp 98.8 F (37.1 C)   Resp 20   Ht 5\' 1"  (1.549 m)   Wt 121 lb (54.9 kg)   SpO2 97%   BMI 22.86 kg/m  Body mass index is 22.86 kg/m. Gen: Afebrile. No acute distress. Nontoxic in appearance, well developed, well  nourished.  HENT: AT. [place]. MMM, no oral lesions. Eyes:Pupils Equal Round Reactive to light, Extraocular movements intact,  Conjunctiva without redness, discharge or icterus. Neck/lymp/endocrine: Supple,no lymphadenopathy CV: RRR no murmur, no edema Chest: CTAB, no wheeze or crackles. Good air movement, normal resp effort.  Abd: Soft. NTND. BS present. no Masses palpated.  Neuro:  Normal gait. PERLA. EOMi. Alert. Oriented x3   No exam data present No results found. No results found for this or any previous visit (from the past 24 hour(s)).  Assessment/Plan: Cynthia Powell is a 71 y.o. female present for OV for   Hypothyroidism, unspecified hypothyroidism type - TSH completed over 1 year ago, will repeat.  - refills provided after results received. 30d to local pharmacy and 90 d to mail in pharm for 1 year.   Essential hypertension, benign - noncompliant with medications.  - restart lisinopril 10 mg, may need increase dose at follow up.  - CBC, CMP, Lipid, A1c and TSh collected today.  - exercise > 150 minutes a week.  - pt counseled on the importance of taking care of herself. Her mother died of a stroke.  - will advise on cholesterol med and ASA after results. Pt against statin.  - f/u 1-2 weeks with provider.    Reviewed expectations re: course of current medical issues.  Discussed self-management of symptoms.  Outlined signs and symptoms indicating need for more acute intervention.  Patient verbalized understanding and all questions were answered.  Patient received an After-Visit Summary.    No orders of the defined types were placed in this encounter.    Note is dictated utilizing voice recognition software. Although note has been proof read prior to signing, occasional typographical errors still can be missed. If any questions arise, please do not hesitate to call for verification.   electronically signed by:  Howard Pouch, DO  [place]

## 2017-01-21 NOTE — Patient Instructions (Signed)
Restart lisinopril 10 mg a day. Follow up in 1 week with me, and we will adjust medication if needed.     Hypertension Hypertension is another name for high blood pressure. High blood pressure forces your heart to work harder to pump blood. This can cause problems over time. There are two numbers in a blood pressure reading. There is a top number (systolic) over a bottom number (diastolic). It is best to have a blood pressure below 120/80. Healthy choices can help lower your blood pressure. You may need medicine to help lower your blood pressure if:  Your blood pressure cannot be lowered with healthy choices.  Your blood pressure is higher than 130/80.  Follow these instructions at home: Eating and drinking  If directed, follow the DASH eating plan. This diet includes: ? Filling half of your plate at each meal with fruits and vegetables. ? Filling one quarter of your plate at each meal with whole grains. Whole grains include whole wheat pasta, brown rice, and whole grain bread. ? Eating or drinking low-fat dairy products, such as skim milk or low-fat yogurt. ? Filling one quarter of your plate at each meal with low-fat (lean) proteins. Low-fat proteins include fish, skinless chicken, eggs, beans, and tofu. ? Avoiding fatty meat, cured and processed meat, or chicken with skin. ? Avoiding premade or processed food.  Eat less than 1,500 mg of salt (sodium) a day.  Limit alcohol use to no more than 1 drink a day for nonpregnant women and 2 drinks a day for men. One drink equals 12 oz of beer, 5 oz of wine, or 1 oz of hard liquor. Lifestyle  Work with your doctor to stay at a healthy weight or to lose weight. Ask your doctor what the best weight is for you.  Get at least 30 minutes of exercise that causes your heart to beat faster (aerobic exercise) most days of the week. This may include walking, swimming, or biking.  Get at least 30 minutes of exercise that strengthens your muscles  (resistance exercise) at least 3 days a week. This may include lifting weights or pilates.  Do not use any products that contain nicotine or tobacco. This includes cigarettes and e-cigarettes. If you need help quitting, ask your doctor.  Check your blood pressure at home as told by your doctor.  Keep all follow-up visits as told by your doctor. This is important. Medicines  Take over-the-counter and prescription medicines only as told by your doctor. Follow directions carefully.  Do not skip doses of blood pressure medicine. The medicine does not work as well if you skip doses. Skipping doses also puts you at risk for problems.  Ask your doctor about side effects or reactions to medicines that you should watch for. Contact a doctor if:  You think you are having a reaction to the medicine you are taking.  You have headaches that keep coming back (recurring).  You feel dizzy.  You have swelling in your ankles.  You have trouble with your vision. Get help right away if:  You get a very bad headache.  You start to feel confused.  You feel weak or numb.  You feel faint.  You get very bad pain in your: ? Chest. ? Belly (abdomen).  You throw up (vomit) more than once.  You have trouble breathing. Summary  Hypertension is another name for high blood pressure.  Making healthy choices can help lower blood pressure. If your blood pressure cannot be controlled  with healthy choices, you may need to take medicine. This information is not intended to replace advice given to you by your health care provider. Make sure you discuss any questions you have with your health care provider. Document Released: 08/01/2007 Document Revised: 01/11/2016 Document Reviewed: 01/11/2016 Elsevier Interactive Patient Education  [employer].   Please help [place] help you:  We are honored you have chosen [facility] for your Primary Care home. Below you will find basic instructions that you  may need to access in the future. Please help [place] help you by reading the instructions, which cover many of the frequent questions we experience.   Prescription refills and request:  -In order to allow more efficient response time, please call your pharmacy for all refills. They will forward the request electronically to [place]. This allows for the quickest possible response. Request left on a nurse line can take longer to refill, since these are checked as time allows between office patients and other phone calls.  - refill request can take up to 3-5 working days to complete.  - If request is sent electronically and request is appropiate, it is usually completed in 1-2 business days.  - all patients will need to be seen routinely for all chronic medical conditions requiring prescription medications (see follow-up below). If you are overdue for follow up on your condition, you will be asked to make an appointment and we will call in enough medication to cover you until your appointment (up to 30 days).  - all controlled substances will require a face to face visit to request/refill.  - if you desire your prescriptions to go through a new pharmacy, and have an active script at original pharmacy, you will need to call your pharmacy and have scripts transferred to new pharmacy. This is completed between the pharmacy locations and not by your provider.    Results: If any images or labs were ordered, it can take up to 1 week to get results depending on the test ordered and the lab/facility running and resulting the test. - Normal or stable results, which do not need further discussion, may be released to your mychart immediately with attached note to you. A call may not be generated for normal results. Please make certain to sign up for mychart. If you have questions on how to activate your mychart you can call the front office.  - If your results need further discussion, our office will attempt to contact you  via phone, and if unable to reach you after 2 attempts, we will release your abnormal result to your mychart with instructions.  - All results will be automatically released in mychart after 1 week.  - Your provider will provide you with explanation and instruction on all relevant material in your results. Please keep in mind, results and labs may appear confusing or abnormal to the untrained eye, but it does not mean they are actually abnormal for you personally. If you have any questions about your results that are not covered, or you desire more detailed explanation than what was provided, you should make an appointment with your provider to do so.   Our office handles many outgoing and incoming calls daily. If we have not contacted you within 1 week about your results, please check your mychart to see if there is a message first and if not, then contact our office.  In helping with this matter, you help decrease call volume, and therefore allow [place] to  be able to respond to patients needs more efficiently.   Acute office visits (sick visit):  An acute visit is intended for a new problem and are scheduled in shorter time slots to allow schedule openings for patients with new problems. This is the appropriate visit to discuss a new problem. In order to provide you with excellent quality medical care with proper time for you to explain your problem, have an exam and receive treatment with instructions, these appointments should be limited to one new problem per visit. If you experience a new problem, in which you desire to be addressed, please make an acute office visit, we save openings on the schedule to accommodate you. Please do not save your new problem for any other type of visit, let us take care of it properly and quickly for you.   Follow up visits:  Depending on your condition(s) your provider will need to see you routinely in order to provide you with quality care and prescribe medication(s).  Most chronic conditions (Example: hypertension, Diabetes, depression/anxiety... etc), require visits a couple times a year. Your provider will instruct you on proper follow up for your personal medical conditions and history. Please make certain to make follow up appointments for your condition as instructed. Failing to do so could result in lapse in your medication treatment/refills. If you request a refill, and are overdue to be seen on a condition, we will always provide you with a 30 day script (once) to allow you time to schedule.    Medicare wellness (well visit): - we have a wonderful Nurse Maudie Mercury), that will meet with you and provide you will yearly medicare wellness visits. These visits should occur yearly (can not be scheduled less than 1 calendar year apart) and cover preventive health, immunizations, advance directives and screenings you are entitled to yearly through your medicare benefits. Do not miss out on your entitled benefits, this is when medicare will pay for these benefits to be ordered for you.  These are strongly encouraged by your provider and is the appropriate type of visit to make certain you are up to date with all preventive health benefits. If you have not had your medicare wellness exam in the last 12 months, please make certain to schedule one by calling the office and schedule your medicare wellness with Maudie Mercury as soon as possible.   Yearly physical (well visit):  - Adults are recommended to be seen yearly for physicals. Check with your insurance and date of your last physical, most insurances require one calendar year between physicals. Physicals include all preventive health topics, screenings, medical exam and labs that are appropriate for gender/age and history. You may have fasting labs needed at this visit. This is a well visit (not a sick visit), new problems should not be covered during this visit (see acute visit).  - Pediatric patients are seen more frequently when they  are younger. Your provider will advise you on well child visit timing that is appropriate for your their age. - This is not a medicare wellness visit. Medicare wellness exams do not have an exam portion to the visit. Some medicare companies allow for a physical, some do not allow a yearly physical. If your medicare allows a yearly physical you can schedule the medicare wellness with our nurse Maudie Mercury and have your physical with your provider after, on the same day. Please check with insurance for your full benefits.   Late Policy/No Shows:  - all new patients should arrive  15-30 minutes earlier than appointment to allow [place] time  to  obtain all personal demographics,  insurance information and for you to complete office paperwork. - All established patients should arrive 10-15 minutes earlier than appointment time to update all information and be checked in .  - In our best efforts to run on time, if you are late for your appointment you will be asked to either reschedule or if able, we will work you back into the schedule. There will be a wait time to work you back in the schedule,  depending on availability.  - If you are unable to make it to your appointment as scheduled, please call 24 hours ahead of time to allow [place] to fill the time slot with someone else who needs to be seen. If you do not cancel your appointment ahead of time, you may be charged a no show fee.

## 2017-01-22 ENCOUNTER — Telehealth: Payer: Self-pay | Admitting: Family Medicine

## 2017-01-22 DIAGNOSIS — E781 Pure hyperglyceridemia: Secondary | ICD-10-CM

## 2017-01-22 MED ORDER — LEVOTHYROXINE SODIUM 88 MCG PO TABS: 88.0000 ug | ORAL_TABLET | Freq: Every day | ORAL | 0 refills | Status: DC

## 2017-01-22 MED ORDER — LEVOTHYROXINE SODIUM 88 MCG PO TABS: 88.0000 ug | ORAL_TABLET | Freq: Every day | ORAL | 3 refills | Status: DC

## 2017-01-22 MED ORDER — FENOFIBRATE 145 MG PO TABS: 145.0000 mg | ORAL_TABLET | Freq: Every day | ORAL | 2 refills | Status: DC

## 2017-01-22 NOTE — Telephone Encounter (Signed)
Please inform patient and I have refilled her thyroid medication with a 30 day supply to the local pharmacy and that he 90 day supply [facility]. - Her labs all look great with the exception of her cholesterol. Her triglycerides are elevated. We had briefly discussed the use of a fenofibrate during her office visit. She had declined use of a statin but was agreeable to fenofibrate. If she is still interested I would like to start this medication for her. Please advise.

## 2017-01-22 NOTE — Telephone Encounter (Signed)
Fenofibrate prescribed 30 d with 2 refills. Follow up in 3 months required for provider appt and fasting labs.

## 2017-01-22 NOTE — Telephone Encounter (Signed)
Spoke with patient reviewed lab results and instructions. Patient verbalized understanding. Patient is willing to try fenofibrate she is requesting we send 30 day supply only to local pharmacy.she would like to see if she tolerates it prior to [place] sending an Rx to mail order.

## 2017-01-23 NOTE — Telephone Encounter (Signed)
Spoke with patient reviewed information and instructions . Patient scheduled for appt next week for HTN she will schedule 3 month follow up on Hyperlipidemia then.

## 2017-01-25 NOTE — Progress Notes (Addendum)
Subjective:   Cynthia Powell is a 71 y.o. female who presents for an Initial Medicare Annual Wellness Visit.  Review of Systems    No ROS.  Medicare Wellness Visit. Additional risk factors are reflected in the social history.   Cardiac Risk Factors include: dyslipidemia;advanced age (>15mn, >>87women);hypertension;family history of premature cardiovascular disease    Sleep patterns: Sleeps 6-7 hours. Home Safety/Smoke Alarms: Feels safe in home. Smoke alarms in place.  Living environment; residence and Firearm Safety: Lives with husband and grand-daughter in 1 story home.  Seat Belt Safety/Bike Helmet: Wears seat belt.   Female:   Pap-Last > 5 years.     Mammo-Pt reports 02/27/2015, normal. Ordered today.         Dexa scan-Pt reports > 2 years. Ordered today.         CCS-Colonoscopy 02/27/2012, h/o polyps. Recall 5 years.      Objective:    Today's Vitals   01/28/17 0849  BP: (!) 144/78  Pulse: 85  Resp: 18  Temp: 98.7 F (37.1 C)  TempSrc: Oral  SpO2: 98%  Weight: 120 lb 1.9 oz (54.5 kg)  Height: 5' 1"  (1.549 m)  PainSc: 2    Body mass index is 22.7 kg/m.  Advanced Directives 01/28/2017  Does Patient Have a Medical Advance Directive? Yes  [place]  Copy of [place]? No - copy requested    Current Medications (verified) Outpatient Encounter Medications as of 01/28/2017  Medication Sig  . cholecalciferol (VITAMIN D) 1000 units tablet Take 5,000 Units by mouth once a week.   . fenofibrate (TRICOR) 145 MG tablet Take 1 tablet (145 mg total) by mouth daily.  .Marland Kitchenlevothyroxine (SYNTHROID, LEVOTHROID) 88 MCG tablet Take 1 tablet (88 mcg total) by mouth daily before breakfast.  . lisinopril (PRINIVIL,ZESTRIL) 10 MG tablet Take 1 tablet (10 mg total) by mouth daily.  .Marland KitchenLUMIGAN 0.01 % SOLN   . Omega 3-6-9 Fatty Acids (OMEGA 3-6-9 PO) Take by mouth.  . Zoster Vaccine Adjuvanted ([facility]  injection Inject 0.5 mLs into the muscle once for 1 dose.   No facility-administered encounter medications on file as of 01/28/2017.     Allergies (verified) Iodinated diagnostic agents   History: Past Medical History:  Diagnosis Date  . Abnormal Pap smear of vagina 1995,2007   pt had h/o abnl pap; does not desire future screening/PAP  . Allergy    seasonal and food  . Arthritis    osteoarthritis  . Benign neoplasm of colon   [place]erv[place]  . Glaucoma   . History of chickenpox   . History of shingles    above her right knee, gets frequently.  .Marland KitchenHyperlipidemia    using Krill oil; refuses statin  . Hypertension   . Osteoporosis   . Sensory hearing loss, bilateral 2017   Bilateral hearing aids. [facility]  . Thyroid disease 1980  . Trigger finger   . Wears hearing aid in both ears    Past Surgical History:  Procedure Laterality Date  . BREAST SURGERY  1990   biopsy  . CESAREAN SECTION    . COLPOSCOPY    . TONSILLECTOMY  1954   Family History  Problem Relation Age of Onset  . Arthritis Mother   . Diabetes Mother   . Stroke Mother   . Hypertension Mother   . Cancer Father   . Heart  disease Father   . Hypertension Father   . Pernicious anemia Father   . Breast cancer Sister        breast cancer  . Melanoma Daughter        melanoma 2009  . Diabetes Daughter   . Mental retardation Daughter   . Hearing loss Maternal Grandfather   . Depression Daughter        second daughter  . OCD Daughter        second daughter   Social History   Socioeconomic History  . Marital status: Married    Spouse name: None  . Number of children: None  . Years of education: None  . Highest education level: None  Social Needs  . Financial resource strain: None  . Food insecurity - worry: None  . Food insecurity - inability: None  . Transportation needs - medical: None  . Transportation needs - non-medical: None  Occupational History  . None    Tobacco Use  . Smoking status: Passive Smoke Exposure - Never Smoker  . Smokeless tobacco: Never Used  Substance and Sexual Activity  . Alcohol use: No  . Drug use: No  . Sexual activity: No  Other Topics Concern  . None  Social History Narrative  . None    Tobacco Counseling Counseling given: Not Answered    Activities of Daily Living In your present state of health, do you have any difficulty performing the following activities: 01/28/2017  Hearing? N  Vision? N  Difficulty concentrating or making decisions? N  Walking or climbing stairs? N  Dressing or bathing? N  Doing errands, shopping? N  Preparing Food and eating ? N  Using the Toilet? N  In the past six months, have you accidently leaked urine? N  Do you have problems with loss of bowel control? N  Managing your Medications? N  Managing your Finances? N  Housekeeping or managing your Housekeeping? N  Some recent data might be hidden     Immunizations and Health Maintenance Immunization History  Administered Date(s) Administered  . Influenza Split 12/26/2007, 10/21/2008, 11/15/2011  . Influenza-Unspecified 11/20/2013, 10/20/2014, 11/22/2016  . MMR 09/23/2012, 10/22/2012  . Pneumococcal Conjugate-13 12/31/2011  . Pneumococcal Polysaccharide-23 11/05/2006  . Tdap 06/20/2009  . Zoster 06/25/2013   Health Maintenance Due  Topic Date Due  . MAMMOGRAM  07/24/1993  . PNA vac Low Risk Adult (2 of 2 - PPSV23) 12/30/2012    Patient Care Team: Ma Hillock, DO as PCP - General (Family Medicine)  Indicate any recent Medical Services you may have received from other than [facility] providers in the past year (date may be approximate).     Assessment:   This is a routine wellness examination for Cynthia Powell. Physical assessment deferred to PCP.   Hearing/Vision screen Hearing Screening Comments: Hearing aids bilaterally.  Vision Screening Comments: Last exam < 1 years. Quarterly for glaucoma. [facility].   Wears glasses. H/O cataract surgery.   Dietary issues and exercise activities discussed: Current Exercise Habits: The patient does not participate in regular exercise at present(Housework), Exercise limited by: None identified   Diet (meal preparation, eat out, water intake, caffeinated beverages, dairy products, fruits and vegetables): Drinks tea and water.   Eats heart healthy diet.   Goals    . Patient Stated     Maintain current health.      Depression Screen PHQ 2/9 Scores 01/28/2017 01/21/2017 03/02/2015  PHQ - 2 Score 0 0 0  Fall Risk Fall Risk  01/28/2017 01/21/2017 03/02/2015  Falls in the past year? No No No    Cognitive Function: MMSE - Mini Mental State Exam 01/28/2017  Orientation to time 5  Orientation to Place 5  Registration 3  Attention/ Calculation 5  Recall 3  Language- name 2 objects 2  Language- repeat 1  Language- follow 3 step command 3  Language- read & follow direction 1  Write a sentence 1  Copy design 1  Total score 30        Screening Tests Health Maintenance  Topic Date Due  . MAMMOGRAM  07/24/1993  . PNA vac Low Risk Adult (2 of 2 - PPSV23) 12/30/2012  . Hepatitis C Screening  01/28/2018 (Originally [birth year])  . COLONOSCOPY  02/26/2017  . TETANUS/TDAP  06/21/2019  . INFLUENZA VACCINE  Completed  . DEXA SCAN  Completed       Plan:     Schedule mammogram and bone scan.  Shingles vaccine at pharmacy.   Bring a copy of your living will and/or healthcare power of attorney to your next office visit.  Continue doing brain stimulating activities (puzzles, reading, adult coloring books, staying active) to keep memory sharp.   I have personally reviewed and noted the following in the patient's chart:   . Medical and social history . Use of alcohol, tobacco or illicit drugs  . Current medications and supplements . Functional ability and status . Nutritional status . Physical activity . Advanced directives . List of other  physicians . Hospitalizations, surgeries, and ER visits in previous 12 months . Vitals . Screenings to include cognitive, depression, and falls . Referrals and appointments  In addition, I have reviewed and discussed with patient certain preventive protocols, quality metrics, and best practice recommendations. A written personalized care plan for preventive services as well as general preventive health recommendations were provided to patient.     Gerilyn Nestle, RN   01/28/2017   Medical screening examination/treatment/procedure(s) were performed by non-physician practitioner and as supervising physician I was immediately available for consultation/collaboration.  I agree with above assessment and plan.  Electronically Signed by: Howard Pouch, DO [facility]

## 2017-01-28 ENCOUNTER — Ambulatory Visit: Payer: Medicare Other

## 2017-01-28 ENCOUNTER — Encounter: Payer: Self-pay | Admitting: Family Medicine

## 2017-01-28 ENCOUNTER — Ambulatory Visit (INDEPENDENT_AMBULATORY_CARE_PROVIDER_SITE_OTHER): Payer: Medicare Other | Admitting: Family Medicine

## 2017-01-28 ENCOUNTER — Other Ambulatory Visit: Payer: Self-pay

## 2017-01-28 VITALS — BP 144/78 | HR 85 | Temp 98.7°F | Resp 18 | Ht 61.0 in | Wt 120.1 lb

## 2017-01-28 DIAGNOSIS — E039 Hypothyroidism, unspecified: Secondary | ICD-10-CM | POA: Diagnosis not present

## 2017-01-28 DIAGNOSIS — E781 Pure hyperglyceridemia: Secondary | ICD-10-CM

## 2017-01-28 DIAGNOSIS — Z0000 Encounter for general adult medical examination without abnormal findings: Secondary | ICD-10-CM | POA: Diagnosis not present

## 2017-01-28 DIAGNOSIS — I10 Essential (primary) hypertension: Secondary | ICD-10-CM

## 2017-01-28 DIAGNOSIS — Z23 Encounter for immunization: Secondary | ICD-10-CM | POA: Diagnosis not present

## 2017-01-28 DIAGNOSIS — Z1231 Encounter for screening mammogram for malignant neoplasm of breast: Secondary | ICD-10-CM

## 2017-01-28 DIAGNOSIS — E2839 Other primary ovarian failure: Secondary | ICD-10-CM

## 2017-01-28 DIAGNOSIS — Z1239 Encounter for other screening for malignant neoplasm of breast: Secondary | ICD-10-CM

## 2017-01-28 MED ORDER — LISINOPRIL 20 MG PO TABS: 20.0000 mg | ORAL_TABLET | Freq: Every day | ORAL | 1 refills | Status: DC

## 2017-01-28 MED ORDER — ZOSTER VAC RECOMB ADJUVANTED 50 MCG/0.5ML IM SUSR: 0.5000 mL | Freq: Once | INTRAMUSCULAR | 1 refills | Status: AC

## 2017-01-28 NOTE — Progress Notes (Signed)
Cynthia Powell , 03-04-1945, 71 y.o., female MRN: [id number] Patient Care Team    Relationship Specialty Notifications Start End  Ma Hillock, DO PCP - General Family Medicine  03/02/15     Chief Complaint  Patient presents with  . Medicare Wellness  . Hypertension     Subjective:  Hypertension/hyperlipidemia:Patient reports she is now taking lisinopril 10 mg Qd. Patient denies chest pain, shortness of breath, dizziness or lower extremity edema.   Pt does not take daily baby ASA. Pt is not prescribed statin (refuses statin). She has started her fenofibrate and is tolerating.  Diet: does not closely monitor.  Exercise: does not routinely exercise.  RF: HTN, HLD, FHX stroke (mother)  Hypothyroid: Pt reports compliance with 88 mcg synthroid daily on an empty stomach. TSH normal 08/31/2015. She denies palpations, flushing, diarrhea or constipation.   Depression screen [facility] 2/9 01/28/2017 01/21/2017 03/02/2015  Decreased Interest 0 0 0  Down, Depressed, Hopeless 0 0 0  PHQ - 2 Score 0 0 0    Allergies  Allergen Reactions  . Iodinated Diagnostic Agents     Other reaction(s): UNCONSCIOUSNESS   Social History   Tobacco Use  . Smoking status: Passive Smoke Exposure - Never Smoker  . Smokeless tobacco: Never Used  Substance Use Topics  . Alcohol use: No   Past Medical History:  Diagnosis Date  . Abnormal Pap smear of vagina 1995,2007   pt had h/o abnl pap; does not desire future screening/PAP  . Allergy    seasonal and food  . Arthritis    osteoarthritis  . Benign neoplasm of colon   . 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 Glaucoma   . History of chickenpox   . History of shingles    above her right knee, gets frequently.  Marland Kitchen Hyperlipidemia    using Krill oil; refuses statin  . Hypertension   . Osteoporosis   . Sensory hearing loss, bilateral 2017   Bilateral hearing aids. [facility]  . Thyroid disease 1980  . Trigger finger   . Wears hearing aid in both ears      Past Surgical History:  Procedure Laterality Date  . BREAST SURGERY  1990   biopsy  . CESAREAN SECTION    . COLPOSCOPY    . TONSILLECTOMY  1954   Family History  Problem Relation Age of Onset  . Arthritis Mother   . Diabetes Mother   . Stroke Mother   . Hypertension Mother   . Cancer Father   . Heart disease Father   . Hypertension Father   . Pernicious anemia Father   . Breast cancer Sister        breast cancer  . Melanoma Daughter        melanoma 2009  . Diabetes Daughter   . Mental retardation Daughter   . Hearing loss Maternal Grandfather   . Depression Daughter        second daughter  . OCD Daughter        second daughter   Allergies as of 01/28/2017      Reactions   Iodinated Diagnostic Agents    Other reaction(s): UNCONSCIOUSNESS      Medication List        Accurate as of 01/28/17  9:32 AM. Always use your most recent med list.          cholecalciferol 1000 units tablet Commonly known as:  VITAMIN D Take 5,000 Units by mouth once  a week.   fenofibrate 145 MG tablet Commonly known as:  TRICOR Take 1 tablet (145 mg total) by mouth daily.   levothyroxine 88 MCG tablet Commonly known as:  SYNTHROID, LEVOTHROID Take 1 tablet (88 mcg total) by mouth daily before breakfast.   lisinopril 10 MG tablet Commonly known as:  PRINIVIL,ZESTRIL Take 1 tablet (10 mg total) by mouth daily.   LUMIGAN 0.01 % Soln Generic drug:  bimatoprost   OMEGA 3-6-9 PO Take by mouth.   Zoster Vaccine Adjuvanted injection Commonly known as:  SHINGRIX Inject 0.5 mLs into the muscle once for 1 dose.       All past medical history, surgical history, allergies, family history, immunizations andmedications were updated in the EMR today and reviewed under the history and medication portions of their EMR.     ROS: Negative, with the exception of above mentioned in HPI   Objective:  BP (!) 144/78 (BP Location: Left Arm, Patient Position: Sitting, Cuff Size: Normal)    Pulse 85   Temp 98.7 F (37.1 C) (Oral)   Resp 18   Ht 5\' 1"  (1.549 m)   Wt 120 lb 1.9 oz (54.5 kg)   SpO2 98%   BMI 22.70 kg/m  Body mass index is 22.7 kg/m.  Gen: Afebrile. No acute distress.  HENT: AT. [place]. MMM.  Eyes:Pupils Equal Round Reactive to light, Extraocular movements intact,  Conjunctiva without redness, discharge or icterus. CV: RRR 1/6 SM, no edema, +2/4 P posterior tibialis pulses Chest: CTAB, no wheeze or crackles Abd: Soft. NTND. BS present.  Neuro: Normal gait. PERLA. EOMi. Alert. Oriented x3   Hearing Screening Comments: Hearing aids bilaterally.  Vision Screening Comments: Last exam < 1 years. Quarterly for glaucoma. [facility].  Wears glasses. H/O cataract surgery.  No results found. No results found for this or any previous visit (from the past 24 hour(s)).  Assessment/Plan: Cynthia Powell is a 71 y.o. female present for OV for   Hypothyroidism, unspecified hypothyroidism type - TSH normal. Refills provided after last visit. - f/u yearly.   Essential hypertension, benign/hypertriglyceridemia - increase lisinopril to 20 mg QD.  - started fenofibrate 1 week ago. (declined statin) - exercise > 150 minutes a week.  - pt counseled on the importance of taking care of herself. Her mother died of a stroke.  - Start ASA 81 mg QD. - 3 months with fasting labs.     Reviewed expectations re: course of current medical issues.  Discussed self-management of symptoms.  Outlined signs and symptoms indicating need for more acute intervention.  Patient verbalized understanding and all questions were answered.  Patient received an After-Visit Summary.    Orders Placed This Encounter  Procedures  . MM Digital Screening  . DG Bone Density  . Pneumococcal polysaccharide vaccine 23-valent greater than or equal to 2yo subcutaneous/IM     Note is dictated utilizing voice recognition software. Although note has been proof read prior to signing, occasional  typographical errors still can be missed. If any questions arise, please do not hesitate to call for verification.   electronically signed by:  Howard Pouch, DO  [place]

## 2017-01-28 NOTE — Patient Instructions (Addendum)
Schedule mammogram and bone scan.  Shingles vaccine at pharmacy.   Bring a copy of your living will and/or healthcare power of attorney to your next office visit.  Continue doing brain stimulating activities (puzzles, reading, adult coloring books, staying active) to keep memory sharp.   Health Maintenance, Female Adopting a healthy lifestyle and getting preventive care can go a long way to promote health and wellness. Talk with your health care provider about what schedule of regular examinations is right for you. This is a good chance for you to check in with your provider about disease prevention and staying healthy. In between checkups, there are plenty of things you can do on your own. Experts have done a lot of research about which lifestyle changes and preventive measures are most likely to keep you healthy. Ask your health care provider for more information. Weight and diet Eat a healthy diet  Be sure to include plenty of vegetables, fruits, low-fat dairy products, and lean protein.  Do not eat a lot of foods high in solid fats, added sugars, or salt.  Get regular exercise. This is one of the most important things you can do for your health. ? Most adults should exercise for at least 150 minutes each week. The exercise should increase your heart rate and make you sweat (moderate-intensity exercise). ? Most adults should also do strengthening exercises at least twice a week. This is in addition to the moderate-intensity exercise.  Maintain a healthy weight  Body mass index (BMI) is a measurement that can be used to identify possible weight problems. It estimates body fat based on height and weight. Your health care provider can help determine your BMI and help you achieve or maintain a healthy weight.  For females 10 years of age and older: ? A BMI below 18.5 is considered underweight. ? A BMI of 18.5 to 24.9 is normal. ? A BMI of 25 to 29.9 is considered overweight. ? A BMI of  30 and above is considered obese.  Watch levels of cholesterol and blood lipids  You should start having your blood tested for lipids and cholesterol at 73 years of age, then have this test every 5 years.  You may need to have your cholesterol levels checked more often if: ? Your lipid or cholesterol levels are high. ? You are older than 73 years of age. ? You are at high risk for heart disease.  Cancer screening Lung Cancer  Lung cancer screening is recommended for adults 50-85 years old who are at high risk for lung cancer because of a history of smoking.  A yearly low-dose CT scan of the lungs is recommended for people who: ? Currently smoke. ? Have quit within the past 15 years. ? Have at least a 30-pack-year history of smoking. A pack year is smoking an average of one pack of cigarettes a day for 1 year.  Yearly screening should continue until it has been 15 years since you quit.  Yearly screening should stop if you develop a health problem that would prevent you from having lung cancer treatment.  Breast Cancer  Practice breast self-awareness. This means understanding how your breasts normally appear and feel.  It also means doing regular breast self-exams. Let your health care provider know about any changes, no matter how small.  If you are in your 20s or 30s, you should have a clinical breast exam (CBE) by a health care provider every 1-3 years as part of a regular health  exam.  If you are 40 or older, have a CBE every year. Also consider having a breast X-ray (mammogram) every year.  If you have a family history of breast cancer, talk to your health care provider about genetic screening.  If you are at high risk for breast cancer, talk to your health care provider about having an MRI and a mammogram every year.  Breast cancer gene (BRCA) assessment is recommended for women who have family members with BRCA-related cancers. BRCA-related cancers  include: ? Breast. ? Ovarian. ? Tubal. ? Peritoneal cancers.  Results of the assessment will determine the need for genetic counseling and BRCA1 and BRCA2 testing.  Cervical Cancer Your health care provider may recommend that you be screened regularly for cancer of the pelvic organs (ovaries, uterus, and vagina). This screening involves a pelvic examination, including checking for microscopic changes to the surface of your cervix (Pap test). You may be encouraged to have this screening done every 3 years, beginning at age 55.  For women ages 59-65, health care providers may recommend pelvic exams and Pap testing every 3 years, or they may recommend the Pap and pelvic exam, combined with testing for human papilloma virus (HPV), every 5 years. Some types of HPV increase your risk of cervical cancer. Testing for HPV may also be done on women of any age with unclear Pap test results.  Other health care providers may not recommend any screening for nonpregnant women who are considered low risk for pelvic cancer and who do not have symptoms. Ask your health care provider if a screening pelvic exam is right for you.  If you have had past treatment for cervical cancer or a condition that could lead to cancer, you need Pap tests and screening for cancer for at least 20 years after your treatment. If Pap tests have been discontinued, your risk factors (such as having a new sexual partner) need to be reassessed to determine if screening should resume. Some women have medical problems that increase the chance of getting cervical cancer. In these cases, your health care provider may recommend more frequent screening and Pap tests.  Colorectal Cancer  This type of cancer can be detected and often prevented.  Routine colorectal cancer screening usually begins at 73 years of age and continues through 73 years of age.  Your health care provider may recommend screening at an earlier age if you have risk factors  for colon cancer.  Your health care provider may also recommend using home test kits to check for hidden blood in the stool.  A small camera at the end of a tube can be used to examine your colon directly (sigmoidoscopy or colonoscopy). This is done to check for the earliest forms of colorectal cancer.  Routine screening usually begins at age 75.  Direct examination of the colon should be repeated every 5-10 years through 73 years of age. However, you may need to be screened more often if early forms of precancerous polyps or small growths are found.  Skin Cancer  Check your skin from head to toe regularly.  Tell your health care provider about any new moles or changes in moles, especially if there is a change in a mole's shape or color.  Also tell your health care provider if you have a mole that is larger than the size of a pencil eraser.  Always use sunscreen. Apply sunscreen liberally and repeatedly throughout the day.  Protect yourself by wearing long sleeves, pants, a wide-brimmed  hat, and sunglasses whenever you are outside.  Heart disease, diabetes, and high blood pressure  High blood pressure causes heart disease and increases the risk of stroke. High blood pressure is more likely to develop in: ? People who have blood pressure in the high end of the normal range (130-139/85-89 mm Hg). ? People who are overweight or obese. ? People who are African American.  If you are 77-97 years of age, have your blood pressure checked every 3-5 years. If you are 30 years of age or older, have your blood pressure checked every year. You should have your blood pressure measured twice-once when you are at a hospital or clinic, and once when you are not at a hospital or clinic. Record the average of the two measurements. To check your blood pressure when you are not at a hospital or clinic, you can use: ? An automated blood pressure machine at a pharmacy. ? A home blood pressure monitor.  If  you are between 7 years and 48 years old, ask your health care provider if you should take aspirin to prevent strokes.  Have regular diabetes screenings. This involves taking a blood sample to check your fasting blood sugar level. ? If you are at a normal weight and have a low risk for diabetes, have this test once every three years after 73 years of age. ? If you are overweight and have a high risk for diabetes, consider being tested at a younger age or more often. Preventing infection Hepatitis B  If you have a higher risk for hepatitis B, you should be screened for this virus. You are considered at high risk for hepatitis B if: ? You were born in a country where hepatitis B is common. Ask your health care provider which countries are considered high risk. ? Your parents were born in a high-risk country, and you have not been immunized against hepatitis B (hepatitis B vaccine). ? You have HIV or AIDS. ? You use needles to inject street drugs. ? You live with someone who has hepatitis B. ? You have had sex with someone who has hepatitis B. ? You get hemodialysis treatment. ? You take certain medicines for conditions, including cancer, organ transplantation, and autoimmune conditions.  Hepatitis C  Blood testing is recommended for: ? Everyone born from [birth year] through 1967. ? Anyone with known risk factors for hepatitis C.  Sexually transmitted infections (STIs)  You should be screened for sexually transmitted infections (STIs) including gonorrhea and chlamydia if: ? You are sexually active and are younger than 73 years of age. ? You are older than 73 years of age and your health care provider tells you that you are at risk for this type of infection. ? Your sexual activity has changed since you were last screened and you are at an increased risk for chlamydia or gonorrhea. Ask your health care provider if you are at risk.  If you do not have HIV, but are at risk, it may be recommended  that you take a prescription medicine daily to prevent HIV infection. This is called pre-exposure prophylaxis (PrEP). You are considered at risk if: ? You are sexually active and do not regularly use condoms or know the HIV status of your partner(s). ? You take drugs by injection. ? You are sexually active with a partner who has HIV.  Talk with your health care provider about whether you are at high risk of being infected with HIV. If you choose to begin  PrEP, you should first be tested for HIV. You should then be tested every 3 months for as long as you are taking PrEP. Pregnancy  If you are premenopausal and you may become pregnant, ask your health care provider about preconception counseling.  If you may become pregnant, take 400 to 800 micrograms (mcg) of folic acid every day.  If you want to prevent pregnancy, talk to your health care provider about birth control (contraception). Osteoporosis and menopause  Osteoporosis is a disease in which the bones lose minerals and strength with aging. This can result in serious bone fractures. Your risk for osteoporosis can be identified using a bone density scan.  If you are 14 years of age or older, or if you are at risk for osteoporosis and fractures, ask your health care provider if you should be screened.  Ask your health care provider whether you should take a calcium or vitamin D supplement to lower your risk for osteoporosis.  Menopause may have certain physical symptoms and risks.  Hormone replacement therapy may reduce some of these symptoms and risks. Talk to your health care provider about whether hormone replacement therapy is right for you. Follow these instructions at home:  Schedule regular health, dental, and eye exams.  Stay current with your immunizations.  Do not use any tobacco products including cigarettes, chewing tobacco, or electronic cigarettes.  If you are pregnant, do not drink alcohol.  If you are  breastfeeding, limit how much and how often you drink alcohol.  Limit alcohol intake to no more than 1 drink per day for nonpregnant women. One drink equals 12 ounces of beer, 5 ounces of wine, or 1 ounces of hard liquor.  Do not use street drugs.  Do not share needles.  Ask your health care provider for help if you need support or information about quitting drugs.  Tell your health care provider if you often feel depressed.  Tell your health care provider if you have ever been abused or do not feel safe at home. This information is not intended to replace advice given to you by your health care provider. Make sure you discuss any questions you have with your health care provider. Document Released: 08/28/2010 Document Revised: 07/21/2015 Document Reviewed: 11/16/2014 Elsevier Interactive Patient Education  2018 [place].    Increase your lisinopril to 20 mg a day. Take two of the 10 mg tabs until new dose comes in... Then take only 1 tab.   Continue fenofibrate, and follow up in 3 months with fasting labs.

## 2017-01-29 ENCOUNTER — Telehealth: Payer: Self-pay | Admitting: Family Medicine

## 2017-01-29 NOTE — Telephone Encounter (Signed)
Spoke with patient regarding symptoms.  Patient received pneumovax yesterday. Patient reports arm is red, hot and painful under injection site. She is taking Tylenol for pain. Advised to apply cool compress and if arm appears worse or not improving, to call for appointment. Patient verbalized understanding.

## 2017-01-29 NOTE — Telephone Encounter (Signed)
Copied from [place]. Topic: General - Other >> Jan 29, 2017  1:39 PM Marin Olp L wrote: Reason for CRM: Had 2nd dose of pneumonia vaccine yesterday 12/03, now today she has extreme redness, heat, and pain below the site of the injection.

## 2017-03-22 DIAGNOSIS — Z780 Asymptomatic menopausal state: Secondary | ICD-10-CM | POA: Diagnosis not present

## 2017-03-22 DIAGNOSIS — M810 Age-related osteoporosis without current pathological fracture: Secondary | ICD-10-CM | POA: Diagnosis not present

## 2017-03-22 DIAGNOSIS — E2839 Other primary ovarian failure: Secondary | ICD-10-CM | POA: Diagnosis not present

## 2017-03-22 DIAGNOSIS — Z1231 Encounter for screening mammogram for malignant neoplasm of breast: Secondary | ICD-10-CM | POA: Diagnosis not present

## 2017-03-22 LAB — HM DEXA SCAN

## 2017-03-22 LAB — HM MAMMOGRAPHY

## 2017-03-25 ENCOUNTER — Encounter: Payer: Self-pay | Admitting: *Deleted

## 2017-03-25 ENCOUNTER — Telehealth: Payer: Self-pay | Admitting: Family Medicine

## 2017-03-25 ENCOUNTER — Encounter: Payer: Self-pay | Admitting: Family Medicine

## 2017-03-25 DIAGNOSIS — R928 Other abnormal and inconclusive findings on diagnostic imaging of breast: Secondary | ICD-10-CM | POA: Insufficient documentation

## 2017-03-25 NOTE — Telephone Encounter (Signed)
Please call pt:  - her mammogram was normal right breast. They are requesting additional views of the left breast. Although scary, this does not mean she has cancer. They just need additional images to get a better look at an area in her left breast. They will contact her to schedule if they have not already.  - her bone density resulted with osteoporosis (bone softening), which puts her at a higher risk for fracture. Please ask her if she has ever taken a medication called fosamax for her bones?  - If not this is recommended. If she is willing to try medication, I will call it in for her. Weight bearing exercise such as walking 15 minutes daily and adequate vit d/ca intake is also recommended.

## 2017-03-26 NOTE — Telephone Encounter (Signed)
Spoke with patient reviewed mammogram and bone density results ,instructions and information. Patient verbalized understanding. Patient is not willing to take fosamax she states there are too many side effects with prescription medications. she will take vit d and calcium

## 2017-04-01 DIAGNOSIS — N6323 Unspecified lump in the left breast, lower outer quadrant: Secondary | ICD-10-CM | POA: Diagnosis not present

## 2017-04-01 DIAGNOSIS — R928 Other abnormal and inconclusive findings on diagnostic imaging of breast: Secondary | ICD-10-CM | POA: Diagnosis not present

## 2017-04-02 ENCOUNTER — Other Ambulatory Visit: Payer: Self-pay | Admitting: *Deleted

## 2017-04-02 DIAGNOSIS — E781 Pure hyperglyceridemia: Secondary | ICD-10-CM

## 2017-04-02 MED ORDER — FENOFIBRATE 145 MG PO TABS: 145.0000 mg | ORAL_TABLET | Freq: Every day | ORAL | 3 refills | Status: DC

## 2017-04-03 ENCOUNTER — Telehealth: Payer: Self-pay | Admitting: Family Medicine

## 2017-04-03 NOTE — Telephone Encounter (Signed)
Copied from [place] [id number]. Topic: Inquiry >> Apr 03, 2017  3:26 PM Cynthia Powell wrote: Imaging done at [place] 03/22/17 screening, call back 04/01/17, recommended for biopsy and wants done at the [place] [address] order for [name] for the biopsy (if you have reports)? They don't have access in our Epic network. Please advise  L US guided breast biopsy &  L Axillary node biopsy

## 2017-04-03 NOTE — Telephone Encounter (Signed)
Spoke with patient she states she had repeat mammogram on 04/01/17 she will request they send her results. [facility] is setting up her appt for Biopsy at [place] at request of patient she wanted [place] to know we would probably receive orders to sign for this,

## 2017-04-03 NOTE — Telephone Encounter (Signed)
I do not have records indicating biopsy need. Please get the result faxed to [place] and then I can I see about ordering it. The last result I received asked for additional views, not biopsy.

## 2017-04-08 ENCOUNTER — Other Ambulatory Visit: Payer: Self-pay | Admitting: Family Medicine

## 2017-04-08 DIAGNOSIS — R599 Enlarged lymph nodes, unspecified: Secondary | ICD-10-CM

## 2017-04-08 DIAGNOSIS — N6320 Unspecified lump in the left breast, unspecified quadrant: Secondary | ICD-10-CM

## 2017-04-09 ENCOUNTER — Other Ambulatory Visit: Payer: Self-pay

## 2017-04-09 ENCOUNTER — Other Ambulatory Visit: Payer: Self-pay | Admitting: Family Medicine

## 2017-04-09 DIAGNOSIS — R599 Enlarged lymph nodes, unspecified: Secondary | ICD-10-CM

## 2017-04-11 ENCOUNTER — Ambulatory Visit
Admission: RE | Admit: 2017-04-11 | Discharge: 2017-04-11 | Disposition: A | Discharge status: Home / Self Care | Payer: Medicare Other | Source: Ambulatory Visit | Attending: Family Medicine | Admitting: Family Medicine

## 2017-04-11 ENCOUNTER — Other Ambulatory Visit: Payer: Self-pay | Admitting: Family Medicine

## 2017-04-11 DIAGNOSIS — R599 Enlarged lymph nodes, unspecified: Secondary | ICD-10-CM

## 2017-04-11 DIAGNOSIS — N6012 Diffuse cystic mastopathy of left breast: Secondary | ICD-10-CM | POA: Diagnosis not present

## 2017-04-11 DIAGNOSIS — N6320 Unspecified lump in the left breast, unspecified quadrant: Secondary | ICD-10-CM

## 2017-04-11 DIAGNOSIS — N6323 Unspecified lump in the left breast, lower outer quadrant: Secondary | ICD-10-CM | POA: Diagnosis not present

## 2017-04-29 ENCOUNTER — Ambulatory Visit (INDEPENDENT_AMBULATORY_CARE_PROVIDER_SITE_OTHER): Payer: Medicare Other | Admitting: Family Medicine

## 2017-04-29 ENCOUNTER — Encounter: Payer: Self-pay | Admitting: Family Medicine

## 2017-04-29 ENCOUNTER — Telehealth: Payer: Self-pay | Admitting: Family Medicine

## 2017-04-29 VITALS — BP 135/85 | HR 74 | Temp 98.7°F | Resp 18 | Ht 61.0 in | Wt 125.0 lb

## 2017-04-29 DIAGNOSIS — E559 Vitamin D deficiency, unspecified: Secondary | ICD-10-CM

## 2017-04-29 DIAGNOSIS — E782 Mixed hyperlipidemia: Secondary | ICD-10-CM | POA: Diagnosis not present

## 2017-04-29 DIAGNOSIS — I10 Essential (primary) hypertension: Secondary | ICD-10-CM

## 2017-04-29 DIAGNOSIS — E039 Hypothyroidism, unspecified: Secondary | ICD-10-CM | POA: Diagnosis not present

## 2017-04-29 DIAGNOSIS — E781 Pure hyperglyceridemia: Secondary | ICD-10-CM

## 2017-04-29 LAB — COMPREHENSIVE METABOLIC PANEL
ALT: 22 U/L (ref 0–35)
AST: 24 U/L (ref 0–37)
Albumin: 4.2 g/dL (ref 3.5–5.2)
Alkaline Phosphatase: 41 U/L (ref 39–117)
BUN: 33 mg/dL — ABNORMAL HIGH (ref 6–23)
CALCIUM: 10 mg/dL (ref 8.4–10.5)
CHLORIDE: 106 meq/L (ref 96–112)
CO2: 27 meq/L (ref 19–32)
Creatinine, Ser: 1.09 mg/dL (ref 0.40–1.20)
GFR: 52.19 mL/min — AB (ref >60.00)
Glucose, Bld: 78 mg/dL (ref 70–99)
Potassium: 4.4 mEq/L (ref 3.5–5.1)
Sodium: 141 mEq/L (ref 135–145)
Total Bilirubin: 0.5 mg/dL (ref 0.2–1.2)
Total Protein: 7.4 g/dL (ref 6.0–8.3)

## 2017-04-29 LAB — LIPID PANEL
CHOL/HDL RATIO: 4
Cholesterol: 175 mg/dL (ref 0–200)
HDL: 49.5 mg/dL (ref >39.00)
LDL CALC: 104 mg/dL — AB (ref 0–99)
NonHDL: 125.12
Triglycerides: 106 mg/dL (ref 0.0–149.0)
VLDL: 21.2 mg/dL (ref 0.0–40.0)

## 2017-04-29 LAB — VITAMIN D 25 HYDROXY (VIT D DEFICIENCY, FRACTURES): VITD: 59.56 ng/mL (ref 30.00–100.00)

## 2017-04-29 MED ORDER — LISINOPRIL 20 MG PO TABS: 20.0000 mg | ORAL_TABLET | Freq: Every day | ORAL | 1 refills | Status: DC

## 2017-04-29 MED ORDER — LEVOTHYROXINE SODIUM 88 MCG PO TABS: 88.0000 ug | ORAL_TABLET | Freq: Every day | ORAL | 3 refills | Status: DC

## 2017-04-29 NOTE — Progress Notes (Signed)
Cynthia Powell , 1945/12/14, 72 y.o., female MRN: [id number] Patient Care Team    Relationship Specialty Notifications Start End  Ma Hillock, DO PCP - General Family Medicine  03/02/15     Chief Complaint  Patient presents with  . Hypertension  . Hyperlipidemia     Subjective:  Hypertension/hyperlipidemia:Patient reports compliance with lisinopril 20 mg QD. Patient denies chest pain, shortness of breath, dizziness or lower extremity edema. Pt does  take daily baby ASA. Pt is not prescribed statin (refuses statin). She has started her fenofibrate and is tolerating.  Cbc: 01/21/2017 WNL CMP: 01/21/2017 WNL Lipid: total chol 229, HDL 38, LDL 143, Tg 239 Diet: does not closely monitor.  Exercise: does not routinely exercise.  RF: HTN, HLD, FHX stroke (mother)  Hypothyroid: Pt reports compliance with 88 mcg synthroid daily on an empty stomach. TSH normal 01/21/2017. She denies palpations, flushing, diarrhea or constipation.   Vit d deficiency/osteoporosis: Pt continues to take 5000 u vit d a day.   Depression screen [facility] 2/9 04/29/2017 01/28/2017 01/21/2017 03/02/2015  Decreased Interest 0 0 0 0  Down, Depressed, Hopeless 0 0 0 0  PHQ - 2 Score 0 0 0 0    Allergies  Allergen Reactions  . Iodinated Diagnostic Agents     Other reaction(s): UNCONSCIOUSNESS   Social History   Tobacco Use  . Smoking status: Passive Smoke Exposure - Never Smoker  . Smokeless tobacco: Never Used  Substance Use Topics  . Alcohol use: No   Past Medical History:  Diagnosis Date  . Abnormal Pap smear of vagina 1995,2007   pt had h/o abnl pap; does not desire future screening/PAP  . Allergy    seasonal and food  . Arthritis    osteoarthritis  . Benign neoplasm of colon   . Cancer ([place]) 1995   cervical (cone bx)  . Glaucoma   . History of chickenpox   . History of shingles    above her right knee, gets frequently.  Marland Kitchen Hyperlipidemia    using Krill oil; refuses statin  . Hypertension   .  Osteoporosis   . Sensory hearing loss, bilateral 2017   Bilateral hearing aids. [facility]  . Thyroid disease 1980  . Trigger finger   . Wears hearing aid in both ears    Past Surgical History:  Procedure Laterality Date  . BREAST SURGERY  1990   biopsy  . CESAREAN SECTION    . COLPOSCOPY    . TONSILLECTOMY  1954   Family History  Problem Relation Age of Onset  . Arthritis Mother   . Diabetes Mother   . Stroke Mother   . Hypertension Mother   . Cancer Father   . Heart disease Father   . Hypertension Father   . Pernicious anemia Father   . Breast cancer Sister        breast cancer  . Melanoma Daughter        melanoma 2009  . Diabetes Daughter   . Mental retardation Daughter   . Hearing loss Maternal Grandfather   . Depression Daughter        second daughter  . OCD Daughter        second daughter   Allergies as of 04/29/2017      Reactions   Iodinated Diagnostic Agents    Other reaction(s): UNCONSCIOUSNESS      Medication List        Accurate as of 04/29/17  8:42 AM. Always use your  most recent med list.          cholecalciferol 1000 units tablet Commonly known as:  VITAMIN D Take 5,000 Units by mouth once a week.   fenofibrate 145 MG tablet Commonly known as:  TRICOR Take 1 tablet (145 mg total) by mouth daily.   levothyroxine 88 MCG tablet Commonly known as:  SYNTHROID, LEVOTHROID Take 1 tablet (88 mcg total) by mouth daily before breakfast.   lisinopril 20 MG tablet Commonly known as:  PRINIVIL,ZESTRIL Take 1 tablet (20 mg total) by mouth daily.   LUMIGAN 0.01 % Soln Generic drug:  bimatoprost   OMEGA 3-6-9 PO Take 1,000 mg by mouth.       All past medical history, surgical history, allergies, family history, immunizations andmedications were updated in the EMR today and reviewed under the history and medication portions of their EMR.     ROS: Negative, with the exception of above mentioned in HPI   Objective:  BP 135/85 (BP Location:  Right Arm, Patient Position: Sitting, Cuff Size: Normal)   Pulse 74   Temp 98.7 F (37.1 C)   Resp 18   Ht 5\' 1"  (1.549 m)   Wt 125 lb (56.7 kg)   SpO2 98%   BMI 23.62 kg/m  Body mass index is 23.62 kg/m.  Gen: Afebrile. No acute distress. pleasant caucasian female.  HENT: AT. [place].  MMM. Eyes:Pupils Equal Round Reactive to light, Extraocular movements intact,  Conjunctiva without redness, discharge or icterus. Neck/lymp/endocrine: Supple,no lymphadenopathy CV: RRR 1/6 SM, no edema, +2/4 P posterior tibialis pulses Chest: CTAB, no wheeze or crackles Abd: Soft. NTND. BS present. no Masses palpated.  Neuro: Normal gait. PERLA. EOMi. Alert. Orientedx3    No exam data present No results found. No results found for this or any previous visit (from the past 24 hour(s)).  Assessment/Plan: Cynthia Powell is a 72 y.o. female present for OV for   Hypothyroidism, unspecified hypothyroidism type - TSH normal 01/21/2017 - f/u yearly.   Osteoporosis/vit d def: - declined fosamax use. Continues vit d 5000u daily.  Essential hypertension, benign/hypertriglyceridemia - Continue lisinopril to 20 mg QD. If still borderline next visit consider increasing or adding HCTZ - Continue fenofibrate; started 3 months ago, retest lipids and LFT today - exercise > 150 minutes a week.  - pt counseled on the importance of taking care of herself. Her mother died of a stroke.  - continue ASA 81 mg QD. - f/u 6 months    Reviewed expectations re: course of current medical issues.  Discussed self-management of symptoms.  Outlined signs and symptoms indicating need for more acute intervention.  Patient verbalized understanding and all questions were answered.  Patient received an After-Visit Summary.    No orders of the defined types were placed in this encounter.    Note is dictated utilizing voice recognition software. Although note has been proof read prior to signing, occasional typographical  errors still can be missed. If any questions arise, please do not hesitate to call for verification.   electronically signed by:  Howard Pouch, DO  [place]

## 2017-04-29 NOTE — Telephone Encounter (Signed)
Please call patient: Her cholesterol looks much improved on medication. Her liver enzymes are normal. Her vitamin D looks great. She did had some signs of mild dehydration. Is important with lisinopril use that she maintain adequate hydration. Please encourage her to drink at least 60-80 ounces of water a day.

## 2017-04-29 NOTE — Patient Instructions (Signed)
Your Blood pressure is borderline, but normal today.  I will call you with lab results.   As long as labs look good, we will see you in 6 months for follow up.

## 2017-04-30 NOTE — Telephone Encounter (Signed)
Spoke with patient reviewed lab results and instructions. Patient verbalized understanding. 

## 2017-06-10 ENCOUNTER — Ambulatory Visit (INDEPENDENT_AMBULATORY_CARE_PROVIDER_SITE_OTHER): Payer: Medicare Other | Admitting: Family Medicine

## 2017-06-10 ENCOUNTER — Encounter: Payer: Self-pay | Admitting: Family Medicine

## 2017-06-10 VITALS — BP 140/82 | HR 73 | Temp 98.0°F | Ht 61.0 in | Wt 124.8 lb

## 2017-06-10 DIAGNOSIS — E782 Mixed hyperlipidemia: Secondary | ICD-10-CM | POA: Diagnosis not present

## 2017-06-10 DIAGNOSIS — E039 Hypothyroidism, unspecified: Secondary | ICD-10-CM

## 2017-06-10 DIAGNOSIS — R5383 Other fatigue: Secondary | ICD-10-CM | POA: Diagnosis not present

## 2017-06-10 LAB — COMPREHENSIVE METABOLIC PANEL
ALT: 19 U/L (ref 0–35)
AST: 22 U/L (ref 0–37)
Albumin: 4.5 g/dL (ref 3.5–5.2)
Alkaline Phosphatase: 42 U/L (ref 39–117)
BUN: 25 mg/dL — AB (ref 6–23)
CHLORIDE: 104 meq/L (ref 96–112)
CO2: 28 meq/L (ref 19–32)
CREATININE: 0.94 mg/dL (ref 0.40–1.20)
Calcium: 10.1 mg/dL (ref 8.4–10.5)
GFR: 61.89 mL/min (ref >60.00)
Glucose, Bld: 76 mg/dL (ref 70–99)
Potassium: 4.8 mEq/L (ref 3.5–5.1)
SODIUM: 141 meq/L (ref 135–145)
Total Bilirubin: 0.4 mg/dL (ref 0.2–1.2)
Total Protein: 7.5 g/dL (ref 6.0–8.3)

## 2017-06-10 LAB — TSH: TSH: 1.01 u[IU]/mL (ref 0.35–4.50)

## 2017-06-10 NOTE — Progress Notes (Signed)
Cynthia Powell , December 28, 1945, 72 y.o., female MRN: [id number] Patient Care Team    Relationship Specialty Notifications Start End  Ma Hillock, DO PCP - General Family Medicine  03/02/15     Chief Complaint  Patient presents with  . Fatigue    Pt c/o lack of energy for about 2-3 weeks now.     Subjective: Pt presents for an OV with complaints of fatigue of 2-3 weeks duration.  Associated symptoms include sleeping more often. She does endorse snoring, however not more than her usual. She is stressed, but again not more than her usual going through a custody case. H/o Hypothyroid, normal TSH in November. Compliant with medications daily. She does not consume red meat. She denies dyspnea on exertion, dizziness, weakness.Marland Kitchen She was started on fenofibrate in [place], but has tolerated well with normal CMP 3 months after start.  Depression screen [facility] 2/9 04/29/2017 01/28/2017 01/21/2017 03/02/2015  Decreased Interest 0 0 0 0  Down, Depressed, Hopeless 0 0 0 0  PHQ - 2 Score 0 0 0 0    Allergies  Allergen Reactions  . Iodinated Diagnostic Agents     Other reaction(s): UNCONSCIOUSNESS   Social History   Tobacco Use  . Smoking status: Passive Smoke Exposure - Never Smoker  . Smokeless tobacco: Never Used  Substance Use Topics  . Alcohol use: No   Past Medical History:  Diagnosis Date  . Abnormal Pap smear of vagina 1995,2007   pt had h/o abnl pap; does not desire future screening/PAP  . Allergy    seasonal and food  . Arthritis    osteoarthritis  . Benign neoplasm of colon   . 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 Glaucoma   . History of chickenpox   . History of shingles    above her right knee, gets frequently.  Marland Kitchen Hyperlipidemia    using Krill oil; refuses statin  . Hypertension   . Osteoporosis   . Sensory hearing loss, bilateral 2017   Bilateral hearing aids. [facility]  . Thyroid disease 1980  . Trigger finger   . Wears hearing aid in both ears    Past  Surgical History:  Procedure Laterality Date  . BREAST SURGERY  1990   biopsy  . CESAREAN SECTION    . COLPOSCOPY    . TONSILLECTOMY  1954   Family History  Problem Relation Age of Onset  . Arthritis Mother   . Diabetes Mother   . Stroke Mother   . Hypertension Mother   . Cancer Father   . Heart disease Father   . Hypertension Father   . Pernicious anemia Father   . Breast cancer Sister        breast cancer  . Melanoma Daughter        melanoma 2009  . Diabetes Daughter   . Mental retardation Daughter   . Hearing loss Maternal Grandfather   . Depression Daughter        second daughter  . OCD Daughter        second daughter   Allergies as of 06/10/2017      Reactions   Iodinated Diagnostic Agents    Other reaction(s): UNCONSCIOUSNESS      Medication List        Accurate as of 06/10/17 10:03 AM. Always use your most recent med list.          aspirin EC 81 MG tablet Take 81 mg by mouth  daily.   cholecalciferol 1000 units tablet Commonly known as:  VITAMIN D Take 5,000 Units by mouth once a week.   fenofibrate 145 MG tablet Commonly known as:  TRICOR Take 1 tablet (145 mg total) by mouth daily.   levothyroxine 88 MCG tablet Commonly known as:  SYNTHROID, LEVOTHROID Take 1 tablet (88 mcg total) by mouth daily before breakfast.   lisinopril 20 MG tablet Commonly known as:  PRINIVIL,ZESTRIL Take 1 tablet (20 mg total) by mouth daily.   LUMIGAN 0.01 % Soln Generic drug:  bimatoprost   OMEGA 3-6-9 PO Take 1,000 mg by mouth.       All past medical history, surgical history, allergies, family history, immunizations andmedications were updated in the EMR today and reviewed under the history and medication portions of their EMR.     ROS: Negative, with the exception of above mentioned in HPI   Objective:  BP 140/82 (BP Location: Left Arm, Patient Position: Sitting, Cuff Size: Normal)   Pulse 73   Temp 98 F (36.7 C) (Oral)   Ht 5' 1" (1.549 m)   Wt  124 lb 12.8 oz (56.6 kg)   SpO2 99%   BMI 23.58 kg/m  Body mass index is 23.58 kg/m. Gen: Afebrile. No acute distress. Nontoxic in appearance, well developed, well nourished.  HENT: AT. . Bilateral TM visualized wnl. MMM, no oral lesions. Bilateral nares without erythema or drainage. Throat without erythema or exudates. No cough or hoarseness Eyes:Pupils Equal Round Reactive to light, Extraocular movements intact,  Conjunctiva without redness, discharge or icterus. Neck/lymp/endocrine: Supple,no lymphadenopathy CV: RRR 1/6 SM, no edema Chest: CTAB, no wheeze or crackles. Good air movement, normal resp effort.  Neuro:  Normal gait. PERLA. EOMi. Alert. Oriented x3 Psych: Normal affect, dress and demeanor. Normal speech. Normal thought content and judgment.  No exam data present No results found. No results found for this or any previous visit (from the past 24 hour(s)).  Assessment/Plan: Cynthia Powell is a 72 y.o. female present for OV for  fatigue/hypothyroidism/hyperlipid--> fenofibrate use.  - encouraged pt to start B12 daily supplement. Fatigue is difficult and usually multifactorial etiology diagnosis. R/o iron, kidney/liver and thyroid cause.  - may need sleep study and/or echo  - Iron, TIBC and Ferritin Panel - Comp Met (CMET) - TSH - f/u dependent on labs.     Reviewed expectations re: course of current medical issues.  Discussed self-management of symptoms.  Outlined signs and symptoms indicating need for more acute intervention.  Patient verbalized understanding and all questions were answered.  Patient received an After-Visit Summary.    No orders of the defined types were placed in this encounter.    Note is dictated utilizing voice recognition software. Although note has been proof read prior to signing, occasional typographical errors still can be missed. If any questions arise, please do not hesitate to call for verification.   electronically signed  by:  Howard Pouch, DO  [place]

## 2017-06-10 NOTE — Patient Instructions (Signed)
Start b12 daily supplement.  We will call you with lab results once available Monitor your sleep hygiene. Make sure eating a well balanced diet.    Fatigue Fatigue is feeling tired all of the time, a lack of energy, or a lack of motivation. Occasional or mild fatigue is often a normal response to activity or life in general. However, long-lasting (chronic) or extreme fatigue may indicate an underlying medical condition. Follow these instructions at home: Watch your fatigue for any changes. The following actions may help to lessen any discomfort you are feeling:  Talk to your health care provider about how much sleep you need each night. Try to get the required amount every night.  Take medicines only as directed by your health care provider.  Eat a healthy and nutritious diet. Ask your health care provider if you need help changing your diet.  Drink enough fluid to keep your urine clear or pale yellow.  Practice ways of relaxing, such as yoga, meditation, massage therapy, or acupuncture.  Exercise regularly.  Change situations that cause you stress. Try to keep your work and personal routine reasonable.  Do not abuse illegal drugs.  Limit alcohol intake to no more than 1 drink per day for nonpregnant women and 2 drinks per day for men. One drink equals 12 ounces of beer, 5 ounces of wine, or 1 ounces of hard liquor.  Take a multivitamin, if directed by your health care provider.  Contact a health care provider if:  Your fatigue does not get better.  You have a fever.  You have unintentional weight loss or gain.  You have headaches.  You have difficulty: ? Falling asleep. ? Sleeping throughout the night.  You feel angry, guilty, anxious, or sad.  You are unable to have a bowel movement (constipation).  You skin is dry.  Your legs or another part of your body is swollen. Get help right away if:  You feel confused.  Your vision is blurry.  You feel faint or  pass out.  You have a severe headache.  You have severe abdominal, pelvic, or back pain.  You have chest pain, shortness of breath, or an irregular or fast heartbeat.  You are unable to urinate or you urinate less than normal.  You develop abnormal bleeding, such as bleeding from the rectum, vagina, nose, lungs, or nipples.  You vomit blood.  You have thoughts about harming yourself or committing suicide.  You are worried that you might harm someone else. This information is not intended to replace advice given to you by your health care provider. Make sure you discuss any questions you have with your health care provider. Document Released: 12/10/2006 Document Revised: 07/21/2015 Document Reviewed: 06/16/2013 Elsevier Interactive Patient Education  [employer].

## 2017-06-11 ENCOUNTER — Ambulatory Visit: Payer: Medicare Other | Admitting: Family Medicine

## 2017-06-11 ENCOUNTER — Telehealth: Payer: Self-pay | Admitting: Family Medicine

## 2017-06-11 LAB — IRON,TIBC AND FERRITIN PANEL
%SAT: 25 % (calc) (ref 11–50)
FERRITIN: 104 ng/mL (ref 20–288)
IRON: 118 ug/dL (ref 45–160)
TIBC: 481 mcg/dL (calc) — ABNORMAL HIGH (ref 250–450)

## 2017-06-11 NOTE — Telephone Encounter (Signed)
Please call pt: - her labs are  Normal (thyroid, iron and CMP). I would recommend she take the B12 as we discussed and follow up in 4 weeks if that has not helped.

## 2017-06-11 NOTE — Telephone Encounter (Signed)
Left detailed message with results and instructions on patient voice mail per DPR 

## 2017-07-30 DIAGNOSIS — H401131 Primary open-angle glaucoma, bilateral, mild stage: Secondary | ICD-10-CM | POA: Diagnosis not present

## 2017-10-03 DIAGNOSIS — H401131 Primary open-angle glaucoma, bilateral, mild stage: Secondary | ICD-10-CM | POA: Diagnosis not present

## 2017-10-29 ENCOUNTER — Ambulatory Visit (INDEPENDENT_AMBULATORY_CARE_PROVIDER_SITE_OTHER): Payer: Medicare Other | Admitting: Family Medicine

## 2017-10-29 ENCOUNTER — Encounter: Payer: Self-pay | Admitting: Family Medicine

## 2017-10-29 VITALS — BP 135/76 | HR 82 | Temp 98.9°F | Resp 20 | Ht 61.0 in | Wt 129.2 lb

## 2017-10-29 DIAGNOSIS — E559 Vitamin D deficiency, unspecified: Secondary | ICD-10-CM | POA: Insufficient documentation

## 2017-10-29 DIAGNOSIS — E039 Hypothyroidism, unspecified: Secondary | ICD-10-CM

## 2017-10-29 DIAGNOSIS — E781 Pure hyperglyceridemia: Secondary | ICD-10-CM | POA: Diagnosis not present

## 2017-10-29 DIAGNOSIS — I10 Essential (primary) hypertension: Secondary | ICD-10-CM | POA: Diagnosis not present

## 2017-10-29 DIAGNOSIS — M810 Age-related osteoporosis without current pathological fracture: Secondary | ICD-10-CM

## 2017-10-29 DIAGNOSIS — E782 Mixed hyperlipidemia: Secondary | ICD-10-CM

## 2017-10-29 MED ORDER — LISINOPRIL 30 MG PO TABS: 30.0000 mg | ORAL_TABLET | Freq: Every day | ORAL | 1 refills | Status: DC

## 2017-10-29 NOTE — Patient Instructions (Addendum)
Increased your lisinopril just a touch from 20 mg to 30 mg a day. Still take one pill a day, this should be just enough to bring down a little bit.   Follow up 6 months on chronic medical conditions.   I hope the best for you and your family.   Next visit we will be collecting labs.    Please help [place] help you:  We are honored you have chosen [facility] for your [facility] home. Below you will find basic instructions that you may need to access in the future. Please help [place] help you by reading the instructions, which cover many of the frequent questions we experience.   Prescription refills and request:  -In order to allow more efficient response time, please call your pharmacy for all refills. They will forward the request electronically to [place]. This allows for the quickest possible response. Request left on a nurse line can take longer to refill, since these are checked as time allows between office patients and other phone calls.  - refill request can take up to 3-5 working days to complete.  - If request is sent electronically and request is appropiate, it is usually completed in 1-2 business days.  - all patients will need to be seen routinely for all chronic medical conditions requiring prescription medications (see follow-up below). If you are overdue for follow up on your condition, you will be asked to make an appointment and we will call in enough medication to cover you until your appointment (up to 30 days).  - all controlled substances will require a face to face visit to request/refill.  - if you desire your prescriptions to go through a new pharmacy, and have an active script at original pharmacy, you will need to call your pharmacy and have scripts transferred to new pharmacy. This is completed between the pharmacy locations and not by your provider.    Results: If any images or labs were ordered, it can take up to 1 week to get results depending on the test ordered and  the lab/facility running and resulting the test. - Normal or stable results, which do not need further discussion, may be released to your mychart immediately with attached note to you. A call may not be generated for normal results. Please make certain to sign up for mychart. If you have questions on how to activate your mychart you can call the front office.  - If your results need further discussion, our office will attempt to contact you via phone, and if unable to reach you after 2 attempts, we will release your abnormal result to your mychart with instructions.  - All results will be automatically released in mychart after 1 week.  - Your provider will provide you with explanation and instruction on all relevant material in your results. Please keep in mind, results and labs may appear confusing or abnormal to the untrained eye, but it does not mean they are actually abnormal for you personally. If you have any questions about your results that are not covered, or you desire more detailed explanation than what was provided, you should make an appointment with your provider to do so.   Our office handles many outgoing and incoming calls daily. If we have not contacted you within 1 week about your results, please check your mychart to see if there is a message first and if not, then contact our office.  In helping with this matter, you help decrease call  volume, and therefore allow [place] to be able to respond to patients needs more efficiently.   Acute office visits (sick visit):  An acute visit is intended for a new problem and are scheduled in shorter time slots to allow schedule openings for patients with new problems. This is the appropriate visit to discuss a new problem. Problems will not be addressed by phone call or Echart message. Appointment is needed if requesting treatment. In order to provide you with excellent quality medical care with proper time for you to explain your problem, have an exam  and receive treatment with instructions, these appointments should be limited to one new problem per visit. If you experience a new problem, in which you desire to be addressed, please make an acute office visit, we save openings on the schedule to accommodate you. Please do not save your new problem for any other type of visit, let us take care of it properly and quickly for you.   Follow up visits:  Depending on your condition(s) your provider will need to see you routinely in order to provide you with quality care and prescribe medication(s). Most chronic conditions (Example: hypertension, Diabetes, depression/anxiety... etc), require visits a couple times a year. Your provider will instruct you on proper follow up for your personal medical conditions and history. Please make certain to make follow up appointments for your condition as instructed. Failing to do so could result in lapse in your medication treatment/refills. If you request a refill, and are overdue to be seen on a condition, we will always provide you with a 30 day script (once) to allow you time to schedule.    Medicare wellness (well visit): - we have a wonderful Nurse Maudie Mercury), that will meet with you and provide you will yearly medicare wellness visits. These visits should occur yearly (can not be scheduled less than 1 calendar year apart) and cover preventive health, immunizations, advance directives and screenings you are entitled to yearly through your medicare benefits. Do not miss out on your entitled benefits, this is when medicare will pay for these benefits to be ordered for you.  These are strongly encouraged by your provider and is the appropriate type of visit to make certain you are up to date with all preventive health benefits. If you have not had your medicare wellness exam in the last 12 months, please make certain to schedule one by calling the office and schedule your medicare wellness with Maudie Mercury as soon as possible.    Yearly physical (well visit):  - Adults are recommended to be seen yearly for physicals. Check with your insurance and date of your last physical, most insurances require one calendar year between physicals. Physicals include all preventive health topics, screenings, medical exam and labs that are appropriate for gender/age and history. You may have fasting labs needed at this visit. This is a well visit (not a sick visit), new problems should not be covered during this visit (see acute visit).  - Pediatric patients are seen more frequently when they are younger. Your provider will advise you on well child visit timing that is appropriate for your their age. - This is not a medicare wellness visit. Medicare wellness exams do not have an exam portion to the visit. Some medicare companies allow for a physical, some do not allow a yearly physical. If your medicare allows a yearly physical you can schedule the medicare wellness with our nurse Maudie Mercury and have your physical with your provider after, on  the same day. Please check with insurance for your full benefits.   Late Policy/No Shows:  - all new patients should arrive 15-30 minutes earlier than appointment to allow [place] time  to  obtain all personal demographics,  insurance information and for you to complete office paperwork. - All established patients should arrive 10-15 minutes earlier than appointment time to update all information and be checked in .  - In our best efforts to run on time, if you are late for your appointment you will be asked to either reschedule or if able, we will work you back into the schedule. There will be a wait time to work you back in the schedule,  depending on availability.  - If you are unable to make it to your appointment as scheduled, please call 24 hours ahead of time to allow [place] to fill the time slot with someone else who needs to be seen. If you do not cancel your appointment ahead of time, you may be charged a no show  fee.

## 2017-10-29 NOTE — Progress Notes (Signed)
Cynthia Powell , [birth year], 72 y.o., female MRN: [id number] Patient Care Team    Relationship Specialty Notifications Start End  Ma Hillock, DO PCP - General Family Medicine  03/02/15     Chief Complaint  Patient presents with  . Hypertension     Subjective:  Hypertension/hyperlipidemia/hypothyroid:Patient reports compliance with lisinopril 20 mg QD. Patient denies chest pain, shortness of breath, dizziness or lower extremity edema.  Pt does  take daily baby ASA. Pt is not prescribed statin (refuses statin). She has started her fenofibrate and is tolerating.  Cbc: 01/21/2017 WNL CMP: 06/10/2017 WNL Lipid: 04/29/2017  total chol 229, HDL 38, LDL 143, Tg 239 TSH: 06/10/2017 WNL Diet: does not closely monitor.  Exercise: does not routinely exercise.  RF: HTN, HLD, FHX stroke (mother)  Hypothyroid:Pt reports compliance with 88 mcg synthroid daily on an empty stomach. TSH normal 14/15/2019. She denies symptoms.   Vit d deficiency/osteoporosis: Pt continues to take 5000 u vit d a day.   Depression screen [facility] 2/9 04/29/2017 01/28/2017 01/21/2017 03/02/2015  Decreased Interest 0 0 0 0  Down, Depressed, Hopeless 0 0 0 0  PHQ - 2 Score 0 0 0 0    Allergies  Allergen Reactions  . Iodinated Diagnostic Agents     Other reaction(s): UNCONSCIOUSNESS   Social History   Tobacco Use  . Smoking status: Passive Smoke Exposure - Never Smoker  . Smokeless tobacco: Never Used  Substance Use Topics  . Alcohol use: No   Past Medical History:  Diagnosis Date  . Abnormal Pap smear of vagina 1995,2007   pt had h/o abnl pap; does not desire future screening/PAP  . Allergy    seasonal and food  . Arthritis    osteoarthritis  . Benign neoplasm of colon   . Cancer ([place]) 1995   cervical (cone bx)  . Glaucoma   . History of chickenpox   . History of shingles    above her right knee, gets frequently.  Marland Kitchen Hyperlipidemia    using Krill oil; refuses statin  . Hypertension   . Osteoporosis   .  Sensory hearing loss, bilateral 2017   Bilateral hearing aids. [facility]  . Thyroid disease 1980  . Trigger finger   . Wears hearing aid in both ears    Past Surgical History:  Procedure Laterality Date  . BREAST SURGERY  1990   biopsy  . CESAREAN SECTION    . COLPOSCOPY    . TONSILLECTOMY  1954   Family History  Problem Relation Age of Onset  . Arthritis Mother   . Diabetes Mother   . Stroke Mother   . Hypertension Mother   . Cancer Father   . Heart disease Father   . Hypertension Father   . Pernicious anemia Father   . Breast cancer Sister        breast cancer  . Melanoma Daughter        melanoma 2009  . Diabetes Daughter   . Mental retardation Daughter   . Hearing loss Maternal Grandfather   . Depression Daughter        second daughter  . OCD Daughter        second daughter   Allergies as of 10/29/2017      Reactions   Iodinated Diagnostic Agents    Other reaction(s): UNCONSCIOUSNESS      Medication List        Accurate as of 10/29/17  1:18 PM. Always use your most recent  med list.          aspirin EC 81 MG tablet Take 81 mg by mouth daily.   cholecalciferol 1000 units tablet Commonly known as:  VITAMIN D Take 5,000 Units by mouth once a week.   fenofibrate 145 MG tablet Commonly known as:  TRICOR Take 1 tablet (145 mg total) by mouth daily.   levothyroxine 88 MCG tablet Commonly known as:  SYNTHROID, LEVOTHROID Take 1 tablet (88 mcg total) by mouth daily before breakfast.   lisinopril 30 MG tablet Commonly known as:  PRINIVIL,ZESTRIL Take 1 tablet (30 mg total) by mouth daily.   LUMIGAN 0.01 % Soln Generic drug:  bimatoprost   multivitamin-iron-minerals-folic acid chewable tablet Frequency:   Dosage:0.0     Instructions:  Note:   OMEGA 3-6-9 PO Take 1,000 mg by mouth.   VITAMIN B 12 PO Take by mouth.       All past medical history, surgical history, allergies, family history, immunizations andmedications were updated in the EMR  today and reviewed under the history and medication portions of their EMR.     ROS: Negative, with the exception of above mentioned in HPI   Objective:  BP 135/76 (BP Location: Left Arm, Patient Position: Sitting, Cuff Size: Normal)   Pulse 82   Temp 98.9 F (37.2 C)   Resp 20   Ht 5\' 1"  (1.549 m)   Wt 129 lb 4 oz (58.6 kg)   SpO2 98%   BMI 24.42 kg/m  Body mass index is 24.42 kg/m.  Gen: Afebrile. No acute distress. Nontoxic in appearance, well developed well nourished. Pleasant female.  HENT: AT. [place].MMM.  Eyes:Pupils Equal Round Reactive to light, Extraocular movements intact,  Conjunctiva without redness, discharge or icterus. Neck/lymp/endocrine: Supple,no lymphadenopathy, no thyromegaly CV: RRR 1/6 SM, no edema, +2/4 P posterior tibialis pulses Chest: CTAB, no wheeze or crackles Abd: Soft. NTND. BS present. no Masses palpated.  Skin: no rashes, purpura or petechiae.  Neuro:  Normal gait. PERLA. EOMi. Alert. Oriented.  Psych: Normal affect, dress and demeanor. Normal speech. Normal thought content and judgment.   No exam data present No results found. No results found for this or any previous visit (from the past 24 hour(s)).  Assessment/Plan: Cynthia Powell is a 72 y.o. female present for OV for   Hypothyroidism, unspecified hypothyroidism type - TSH normal 2019 - f/u yearly.   Osteoporosis/vit d def: - declined fosamax use. Continues vit d 5000u daily. 04/29/2017 NL (59)  Essential hypertension, benign/hypertriglyceridemia - increase lisinopril 30 mg QD.  - Continue fenofibrate - exercise > 150 minutes a week.  - pt counseled on the importance of taking care of herself. Her mother died of a stroke.  - continue ASA 81 mg QD. - f/u 6 months    Reviewed expectations re: course of current medical issues.  Discussed self-management of symptoms.  Outlined signs and symptoms indicating need for more acute intervention.  Patient verbalized understanding and all  questions were answered.  Patient received an After-Visit Summary.    No orders of the defined types were placed in this encounter.    Note is dictated utilizing voice recognition software. Although note has been proof read prior to signing, occasional typographical errors still can be missed. If any questions arise, please do not hesitate to call for verification.   electronically signed by:  Howard Pouch, DO  [place]

## 2018-03-04 DIAGNOSIS — H401131 Primary open-angle glaucoma, bilateral, mild stage: Secondary | ICD-10-CM | POA: Diagnosis not present

## 2018-03-23 ENCOUNTER — Other Ambulatory Visit: Payer: Self-pay | Admitting: Family Medicine

## 2018-03-23 DIAGNOSIS — E039 Hypothyroidism, unspecified: Secondary | ICD-10-CM

## 2018-03-23 DIAGNOSIS — E781 Pure hyperglyceridemia: Secondary | ICD-10-CM

## 2018-03-23 DIAGNOSIS — E559 Vitamin D deficiency, unspecified: Secondary | ICD-10-CM

## 2018-03-23 DIAGNOSIS — E782 Mixed hyperlipidemia: Secondary | ICD-10-CM

## 2018-03-23 DIAGNOSIS — I10 Essential (primary) hypertension: Secondary | ICD-10-CM

## 2018-03-24 NOTE — Telephone Encounter (Signed)
Pt must keep appt in March for refills

## 2018-04-30 ENCOUNTER — Encounter: Payer: Self-pay | Admitting: Family Medicine

## 2018-04-30 ENCOUNTER — Ambulatory Visit (INDEPENDENT_AMBULATORY_CARE_PROVIDER_SITE_OTHER): Payer: Medicare Other | Admitting: Family Medicine

## 2018-04-30 VITALS — BP 116/71 | HR 69 | Temp 98.7°F | Resp 16 | Ht 61.0 in | Wt 126.4 lb

## 2018-04-30 DIAGNOSIS — E781 Pure hyperglyceridemia: Secondary | ICD-10-CM | POA: Diagnosis not present

## 2018-04-30 DIAGNOSIS — E782 Mixed hyperlipidemia: Secondary | ICD-10-CM | POA: Diagnosis not present

## 2018-04-30 DIAGNOSIS — I10 Essential (primary) hypertension: Secondary | ICD-10-CM

## 2018-04-30 DIAGNOSIS — Z1159 Encounter for screening for other viral diseases: Secondary | ICD-10-CM | POA: Diagnosis not present

## 2018-04-30 DIAGNOSIS — Z1211 Encounter for screening for malignant neoplasm of colon: Secondary | ICD-10-CM

## 2018-04-30 DIAGNOSIS — E559 Vitamin D deficiency, unspecified: Secondary | ICD-10-CM | POA: Diagnosis not present

## 2018-04-30 DIAGNOSIS — E039 Hypothyroidism, unspecified: Secondary | ICD-10-CM | POA: Diagnosis not present

## 2018-04-30 DIAGNOSIS — M810 Age-related osteoporosis without current pathological fracture: Secondary | ICD-10-CM

## 2018-04-30 LAB — COMPREHENSIVE METABOLIC PANEL
ALBUMIN: 4.6 g/dL (ref 3.5–5.2)
ALT: 22 U/L (ref 0–35)
AST: 25 U/L (ref 0–37)
Alkaline Phosphatase: 43 U/L (ref 39–117)
BUN: 30 mg/dL — AB (ref 6–23)
CHLORIDE: 104 meq/L (ref 96–112)
CO2: 27 meq/L (ref 19–32)
CREATININE: 1.03 mg/dL (ref 0.40–1.20)
Calcium: 10 mg/dL (ref 8.4–10.5)
GFR: 52.27 mL/min — ABNORMAL LOW (ref >60.00)
GLUCOSE: 79 mg/dL (ref 70–99)
Potassium: 4.6 mEq/L (ref 3.5–5.1)
SODIUM: 140 meq/L (ref 135–145)
Total Bilirubin: 0.4 mg/dL (ref 0.2–1.2)
Total Protein: 7.6 g/dL (ref 6.0–8.3)

## 2018-04-30 LAB — LIPID PANEL
CHOL/HDL RATIO: 5
Cholesterol: 221 mg/dL — ABNORMAL HIGH (ref 0–200)
HDL: 43.2 mg/dL (ref >39.00)
LDL CALC: 140 mg/dL — AB (ref 0–99)
NONHDL: 177.98
Triglycerides: 191 mg/dL — ABNORMAL HIGH (ref 0.0–149.0)
VLDL: 38.2 mg/dL (ref 0.0–40.0)

## 2018-04-30 LAB — CBC
HCT: 37.2 % (ref 36.0–46.0)
Hemoglobin: 12.5 g/dL (ref 12.0–15.0)
MCHC: 33.5 g/dL (ref 30.0–36.0)
MCV: 86.4 fl (ref 78.0–100.0)
Platelets: 340 10*3/uL (ref 150.0–400.0)
RBC: 4.31 Mil/uL (ref 3.87–5.11)
RDW: 12.8 % (ref 11.5–15.5)
WBC: 8.4 10*3/uL (ref 4.0–10.5)

## 2018-04-30 LAB — TSH: TSH: 2.08 u[IU]/mL (ref 0.35–4.50)

## 2018-04-30 MED ORDER — FENOFIBRATE 145 MG PO TABS: 145.0000 mg | ORAL_TABLET | Freq: Every day | ORAL | 3 refills | Status: DC

## 2018-04-30 MED ORDER — LISINOPRIL 30 MG PO TABS: ORAL_TABLET | ORAL | 1 refills | Status: DC

## 2018-04-30 NOTE — Progress Notes (Signed)
Cynthia Powell , [birth year], 73 y.o., female MRN: [id number] Patient Care Team    Relationship Specialty Notifications Start End  Ma Hillock, DO PCP - General Family Medicine  03/02/15     Chief Complaint  Patient presents with  . Hypertension    Fasting. Pt has had sinus pressure and congestion x2 weeks and thinks she is about over this.   . Hyperlipidemia  . Hypothyroidism     Subjective:  Hypertension/hyperlipidemia/hypothyroid:Patient reports compliance with lisinopril 20 mg QD. Patient denies chest pain, shortness of breath, dizziness or lower extremity edema.  Pt does  take daily baby ASA. Pt is not prescribed statin (refuses statin). She has started her fenofibrate and is tolerating.  Cbc: 01/21/2017 WNL CMP: 06/10/2017 WNL Lipid: 04/29/2017  total chol 229, HDL 38, LDL 143, Tg 239 TSH: 06/10/2017 WNL Diet: does not closely monitor.  Exercise: does not routinely exercise.  RF: HTN, HLD, FHX stroke (mother)  Hypothyroid:Pt reports compliance with 88 mcg synthroid daily on an empty stomach. TSH normal 14/15/2019. She denies symptoms.   Vit d deficiency/osteoporosis: Pt continues to take 5000 u vit d a day.   Depression screen [facility] 2/9 04/30/2018 04/29/2017 01/28/2017 01/21/2017 03/02/2015  Decreased Interest 0 0 0 0 0  Down, Depressed, Hopeless 0 0 0 0 0  PHQ - 2 Score 0 0 0 0 0    Allergies  Allergen Reactions  . Iodinated Diagnostic Agents     Other reaction(s): UNCONSCIOUSNESS   Social History   Tobacco Use  . Smoking status: Passive Smoke Exposure - Never Smoker  . Smokeless tobacco: Never Used  Substance Use Topics  . Alcohol use: No   Past Medical History:  Diagnosis Date  . Abnormal Pap smear of vagina 1995,2007   pt had h/o abnl pap; does not desire future screening/PAP  . Allergy    seasonal and food  . Arthritis    osteoarthritis  . Benign neoplasm of colon   . 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 Glaucoma   . History of chickenpox   . History  of shingles    above her right knee, gets frequently.  Marland Kitchen Hyperlipidemia    using Krill oil; refuses statin  . Hypertension   . Osteoporosis   . Sensory hearing loss, bilateral 2017   Bilateral hearing aids. [facility]  . Thyroid disease 1980  . Trigger finger   . Wears hearing aid in both ears    Past Surgical History:  Procedure Laterality Date  . BREAST SURGERY  1990   biopsy  . CESAREAN SECTION    . COLPOSCOPY    . TONSILLECTOMY  1954   Family History  Problem Relation Age of Onset  . Arthritis Mother   . Diabetes Mother   . Stroke Mother   . Hypertension Mother   . Cancer Father   . Heart disease Father   . Hypertension Father   . Pernicious anemia Father   . Breast cancer Sister        breast cancer  . Melanoma Daughter        melanoma 2009  . Diabetes Daughter   . Mental retardation Daughter   . Hearing loss Maternal Grandfather   . Depression Daughter        second daughter  . OCD Daughter        second daughter   Allergies as of 04/30/2018      Reactions   Iodinated Diagnostic Agents  Other reaction(s): UNCONSCIOUSNESS      Medication List       Accurate as of April 30, 2018  8:32 AM. Always use your most recent med list.        aspirin EC 81 MG tablet Take 81 mg by mouth daily.   cholecalciferol 1000 units tablet Commonly known as:  VITAMIN D Take 5,000 Units by mouth once a week.   fenofibrate 145 MG tablet Commonly known as:  TRICOR Take 1 tablet (145 mg total) by mouth daily.   levothyroxine 88 MCG tablet Commonly known as:  SYNTHROID, LEVOTHROID Take 1 tablet (88 mcg total) by mouth daily before breakfast.   lisinopril 30 MG tablet Commonly known as:  PRINIVIL,ZESTRIL TAKE 1 TABLET DAILY (DOSE CHANGED)   LUMIGAN 0.01 % Soln Generic drug:  bimatoprost   multivitamin-iron-minerals-folic acid chewable tablet Frequency:   Dosage:0.0     Instructions:  Note:   OMEGA 3-6-9 PO Take 1,000 mg by mouth.   VITAMIN B 12 PO Take by  mouth.       All past medical history, surgical history, allergies, family history, immunizations andmedications were updated in the EMR today and reviewed under the history and medication portions of their EMR.     ROS: Negative, with the exception of above mentioned in HPI   Objective:  BP 116/71 (BP Location: Left Arm, Patient Position: Sitting, Cuff Size: Normal)   Pulse 69   Temp 98.7 F (37.1 C) (Oral)   Resp 16   Ht 5\' 1"  (1.549 m)   Wt 126 lb 6 oz (57.3 kg)   SpO2 99%   BMI 23.88 kg/m  Body mass index is 23.88 kg/m.  Gen: Afebrile. No acute distress. Nontoxic. Pleasant caucasian female.  HENT: AT. [place]. MMM.  Eyes:Pupils Equal Round Reactive to light, Extraocular movements intact,  Conjunctiva without redness, discharge or icterus. CV: RRR 1/6 SM, no edema, +2/4 P posterior tibialis pulses Chest: CTAB, no wheeze or crackles Abd: Soft. NTND. BS present Skin: no rashes, purpura or petechiae.  Neuro: Normal gait. PERLA. EOMi. Alert. Oriented x3  Psych: Normal affect, dress and demeanor. Normal speech. Normal thought content and judgment.   No exam data present No results found. No results found for this or any previous visit (from the past 24 hour(s)).  Assessment/Plan: [name] is a 73 y.o. female present for OV for   Hypothyroidism, unspecified hypothyroidism type - TSH ordered- refills after results.  - f/u yearly.   Osteoporosis/vit d def: - declined fosamax use. Continue  vit d 5000u daily. 04/29/2017 NL (59)  Essential hypertension, benign/hypertriglyceridemia - Stable continue  lisinopril 30 mg QD.  - Continue fenofibrate - exercise > 150 minutes a week.  - pt counseled on the importance of taking care of herself. Her mother died of a stroke.  - continue ASA 81 mg QD. - CBC, CMP, TSH and lipids collected today - f/u 6 months  Hep C screen: - pt agreeable to screen today  - Hep C collected  Colon cancer screen:  - agreeable to referral. Overdue  for repeat     Reviewed expectations re: course of current medical issues.  Discussed self-management of symptoms.  Outlined signs and symptoms indicating need for more acute intervention.  Patient verbalized understanding and all questions were answered.  Patient received an After-Visit Summary.    No orders of the defined types were placed in this encounter.    Note is dictated utilizing voice recognition software. Although note has  been proof read prior to signing, occasional typographical errors still can be missed. If any questions arise, please do not hesitate to call for verification.   electronically signed by:  Howard Pouch, DO  [place]

## 2018-04-30 NOTE — Patient Instructions (Signed)
Follow up in 6 months for chronic medical conditions.  Referred you to gastroenterology for colon cancer screen.  We will call you with lab results once completed.   Please help [place] help you:  We are honored you have chosen [facility] for your Primary Care home. Below you will find basic instructions that you may need to access in the future. Please help [place] help you by reading the instructions, which cover many of the frequent questions we experience.   Prescription refills and request:  -In order to allow more efficient response time, please call your pharmacy for all refills. They will forward the request electronically to [place]. This allows for the quickest possible response. Request left on a nurse line can take longer to refill, since these are checked as time allows between office patients and other phone calls.  - refill request can take up to 3-5 working days to complete.  - If request is sent electronically and request is appropiate, it is usually completed in 1-2 business days.  - all patients will need to be seen routinely for all chronic medical conditions requiring prescription medications (see follow-up below). If you are overdue for follow up on your condition, you will be asked to make an appointment and we will call in enough medication to cover you until your appointment (up to 30 days).  - all controlled substances will require a face to face visit to request/refill.  - if you desire your prescriptions to go through a new pharmacy, and have an active script at original pharmacy, you will need to call your pharmacy and have scripts transferred to new pharmacy. This is completed between the pharmacy locations and not by your provider.    Results: If any images or labs were ordered, it can take up to 1 week to get results depending on the test ordered and the lab/facility running and resulting the test. - Normal or stable results, which do not need further discussion, may be  released to your mychart immediately with attached note to you. A call may not be generated for normal results. Please make certain to sign up for mychart. If you have questions on how to activate your mychart you can call the front office.  - If your results need further discussion, our office will attempt to contact you via phone, and if unable to reach you after 2 attempts, we will release your abnormal result to your mychart with instructions.  - All results will be automatically released in mychart after 1 week.  - Your provider will provide you with explanation and instruction on all relevant material in your results. Please keep in mind, results and labs may appear confusing or abnormal to the untrained eye, but it does not mean they are actually abnormal for you personally. If you have any questions about your results that are not covered, or you desire more detailed explanation than what was provided, you should make an appointment with your provider to do so.   Our office handles many outgoing and incoming calls daily. If we have not contacted you within 1 week about your results, please check your mychart to see if there is a message first and if not, then contact our office.  In helping with this matter, you help decrease call volume, and therefore allow [place] to be able to respond to patients needs more efficiently.   Acute office visits (sick visit):  An acute visit is intended for a new problem and are  scheduled in shorter time slots to allow schedule openings for patients with new problems. This is the appropriate visit to discuss a new problem. Problems will not be addressed by phone call or Echart message. Appointment is needed if requesting treatment. In order to provide you with excellent quality medical care with proper time for you to explain your problem, have an exam and receive treatment with instructions, these appointments should be limited to one new problem per visit. If you  experience a new problem, in which you desire to be addressed, please make an acute office visit, we save openings on the schedule to accommodate you. Please do not save your new problem for any other type of visit, let us take care of it properly and quickly for you.   Follow up visits:  Depending on your condition(s) your provider will need to see you routinely in order to provide you with quality care and prescribe medication(s). Most chronic conditions (Example: hypertension, Diabetes, depression/anxiety... etc), require visits a couple times a year. Your provider will instruct you on proper follow up for your personal medical conditions and history. Please make certain to make follow up appointments for your condition as instructed. Failing to do so could result in lapse in your medication treatment/refills. If you request a refill, and are overdue to be seen on a condition, we will always provide you with a 30 day script (once) to allow you time to schedule.    Medicare wellness (well visit): - we have a wonderful Nurse Maudie Mercury), that will meet with you and provide you will yearly medicare wellness visits. These visits should occur yearly (can not be scheduled less than 1 calendar year apart) and cover preventive health, immunizations, advance directives and screenings you are entitled to yearly through your medicare benefits. Do not miss out on your entitled benefits, this is when medicare will pay for these benefits to be ordered for you.  These are strongly encouraged by your provider and is the appropriate type of visit to make certain you are up to date with all preventive health benefits. If you have not had your medicare wellness exam in the last 12 months, please make certain to schedule one by calling the office and schedule your medicare wellness with Maudie Mercury as soon as possible.   Yearly physical (well visit):  - Adults are recommended to be seen yearly for physicals. Check with your insurance  and date of your last physical, most insurances require one calendar year between physicals. Physicals include all preventive health topics, screenings, medical exam and labs that are appropriate for gender/age and history. You may have fasting labs needed at this visit. This is a well visit (not a sick visit), new problems should not be covered during this visit (see acute visit).  - Pediatric patients are seen more frequently when they are younger. Your provider will advise you on well child visit timing that is appropriate for your their age. - This is not a medicare wellness visit. Medicare wellness exams do not have an exam portion to the visit. Some medicare companies allow for a physical, some do not allow a yearly physical. If your medicare allows a yearly physical you can schedule the medicare wellness with our nurse Maudie Mercury and have your physical with your provider after, on the same day. Please check with insurance for your full benefits.   Late Policy/No Shows:  - all new patients should arrive 15-30 minutes earlier than appointment to allow [place] time  to  obtain all personal demographics,  insurance information and for you to complete office paperwork. - All established patients should arrive 10-15 minutes earlier than appointment time to update all information and be checked in .  - In our best efforts to run on time, if you are late for your appointment you will be asked to either reschedule or if able, we will work you back into the schedule. There will be a wait time to work you back in the schedule,  depending on availability.  - If you are unable to make it to your appointment as scheduled, please call 24 hours ahead of time to allow [place] to fill the time slot with someone else who needs to be seen. If you do not cancel your appointment ahead of time, you may be charged a no show fee.

## 2018-05-01 ENCOUNTER — Telehealth: Payer: Self-pay | Admitting: Family Medicine

## 2018-05-01 DIAGNOSIS — Z5320 Procedure and treatment not carried out because of patient's decision for unspecified reasons: Secondary | ICD-10-CM | POA: Insufficient documentation

## 2018-05-01 LAB — HEPATITIS C ANTIBODY
HEP C AB: NONREACTIVE
SIGNAL TO CUT-OFF: 0.03 (ref <1.00)

## 2018-05-01 NOTE — Telephone Encounter (Signed)
Patient advised and voiced understanding. She did not want to start statin medication and will continue to work on diet/exercise.

## 2018-05-01 NOTE — Telephone Encounter (Signed)
Please inform patient the following information: Her labs are all normal with the exception of her cholesterol panel increased. She has declined statins in the past. If she is interested we can start a low dose statin to help better control her cholesterol (please advise).  Her bad cholesterol went from 104--> 140.  Continue the fenofibrate.  Diet low in saturated fats, higher in fiber and routine exercise.

## 2018-06-23 ENCOUNTER — Other Ambulatory Visit: Payer: Self-pay | Admitting: Family Medicine

## 2018-06-23 DIAGNOSIS — E039 Hypothyroidism, unspecified: Secondary | ICD-10-CM

## 2018-06-23 DIAGNOSIS — E782 Mixed hyperlipidemia: Secondary | ICD-10-CM

## 2018-06-23 DIAGNOSIS — E781 Pure hyperglyceridemia: Secondary | ICD-10-CM

## 2018-06-23 DIAGNOSIS — I10 Essential (primary) hypertension: Secondary | ICD-10-CM

## 2018-06-23 DIAGNOSIS — E559 Vitamin D deficiency, unspecified: Secondary | ICD-10-CM

## 2018-07-29 DIAGNOSIS — H401131 Primary open-angle glaucoma, bilateral, mild stage: Secondary | ICD-10-CM | POA: Diagnosis not present

## 2018-09-30 ENCOUNTER — Ambulatory Visit (INDEPENDENT_AMBULATORY_CARE_PROVIDER_SITE_OTHER): Payer: Medicare Other

## 2018-09-30 ENCOUNTER — Other Ambulatory Visit: Payer: Self-pay

## 2018-09-30 VITALS — BP 127/69 | HR 76 | Wt 124.4 lb

## 2018-09-30 DIAGNOSIS — Z0000 Encounter for general adult medical examination without abnormal findings: Secondary | ICD-10-CM | POA: Diagnosis not present

## 2018-09-30 DIAGNOSIS — Z23 Encounter for immunization: Secondary | ICD-10-CM

## 2018-09-30 MED ORDER — SHINGRIX 50 MCG/0.5ML IM SUSR: 0.5000 mL | Freq: Once | INTRAMUSCULAR | 1 refills | Status: AC

## 2018-09-30 NOTE — Progress Notes (Addendum)
Subjective:   Cynthia Powell is a 73 y.o. female who presents for Medicare Annual (Subsequent) preventive examination.  Review of Systems:  No ROS.  Medicare Wellness Virtual Visit.  Visual/audio telehealth visit, UTA vital signs.   See social history for additional risk factors.   Cardiac Risk Factors include: advanced age (>30mn, >>90women);dyslipidemia;hypertension;sedentary lifestyle;family history of premature cardiovascular disease   Sleep patterns: Sleeps well, not consistently d/t gdaughter.  Home Safety/Smoke Alarms: Feels safe in home. Smoke alarms in place.  Living environment; residence and Firearm Safety: Lives with husband and grand-daughter in 1 story home.  Seat Belt Safety/Bike Helmet: Wears seat belt.   Female:   Pap-N/A       Mammo-03/22/2017, [place]. Plans to repeat in 02/2019      Dexa scan-03/22/2017, Osteoporosis. Plans to repeat in 02/2019.  CCS-Colonoscopy 02/27/2012, h/o polyps. Recall 5 years. Plans to wait a few months (COVID)      Objective:     Vitals: BP 127/69 Comment: Pt obtained vitals at home  Pulse 76   Wt 124 lb 6 oz (56.4 kg)   BMI 23.50 kg/m   Body mass index is 23.5 kg/m.  Advanced Directives 09/30/2018 01/28/2017  Does Patient Have a Medical Advance Directive? Yes Yes  Type of [place]  Copy of [place]? Yes - validated most recent copy scanned in chart (See row information) No - copy requested    Tobacco Social History   Tobacco Use  Smoking Status Passive Smoke Exposure - Never Smoker  Smokeless Tobacco Never Used     Counseling given: Not Answered    Past Medical History:  Diagnosis Date  . Abnormal Pap smear of vagina 1995,2007   pt had h/o abnl pap; does not desire future screening/PAP  . Allergy    seasonal and food  . Arthritis    osteoarthritis  . Benign neoplasm of colon   . [place]   cervical (cone bx)  . Glaucoma   . History of chickenpox   . History of shingles    above her right knee, gets frequently.  .Marland KitchenHyperlipidemia    using Krill oil; refuses statin  . Hypertension   . Osteoporosis   . Sensory hearing loss, bilateral 2017   Bilateral hearing aids. [facility]  . Thyroid disease 1980  . Trigger finger   . Wears hearing aid in both ears    Past Surgical History:  Procedure Laterality Date  . BREAST SURGERY  1990   biopsy  . CESAREAN SECTION    . COLPOSCOPY    . TONSILLECTOMY  1954   Family History  Problem Relation Age of Onset  . Arthritis Mother   . Diabetes Mother   . Stroke Mother   . Hypertension Mother   . Cancer Father   . Heart disease Father   . Hypertension Father   . Pernicious anemia Father   . Breast cancer Sister        breast cancer  . Melanoma Daughter        melanoma 2009  . Diabetes Daughter   . Mental retardation Daughter   . Hearing loss Maternal Grandfather   . Depression Daughter        second daughter  . OCD Daughter        second daughter   Social History   Socioeconomic History  . Marital status: Married  Spouse name: Not on file  . Number of children: Not on file  . Years of education: Not on file  . Highest education level: Not on file  Occupational History  . Not on file  Social Needs  . Financial resource strain: Not on file  . Food insecurity    Worry: Not on file    Inability: Not on file  . Transportation needs    Medical: Not on file    Non-medical: Not on file  Tobacco Use  . Smoking status: Passive Smoke Exposure - Never Smoker  . Smokeless tobacco: Never Used  Substance and Sexual Activity  . Alcohol use: No  . Drug use: No  . Sexual activity: Never  Lifestyle  . Physical activity    Days per week: Not on file    Minutes per session: Not on file  . Stress: Not on file  Relationships  . Social Herbalist on phone: Not on file    Gets together: Not on  file    Attends religious service: Not on file    Active member of club or organization: Not on file    Attends meetings of clubs or organizations: Not on file    Relationship status: Not on file  Other Topics Concern  . Not on file  Social History Narrative  . Not on file    Outpatient Encounter Medications as of 09/30/2018  Medication Sig  . aspirin EC 81 MG tablet Take 81 mg by mouth daily.  . cholecalciferol (VITAMIN D) 1000 units tablet Take 5,000 Units by mouth once a week.   . Cyanocobalamin (VITAMIN B 12 PO) Take by mouth.  . fenofibrate (TRICOR) 145 MG tablet Take 1 tablet (145 mg total) by mouth daily.  Marland Kitchen levothyroxine (SYNTHROID) 88 MCG tablet TAKE 1 TABLET DAILY BEFORE BREAKFAST  . lisinopril (PRINIVIL,ZESTRIL) 30 MG tablet TAKE 1 TABLET DAILY  . LUMIGAN 0.01 % SOLN   . multivitamin-iron-minerals-folic acid (CENTRUM) chewable tablet Frequency:   Dosage:0.0     Instructions:  Note:  . Zoster Vaccine Adjuvanted [facility]) injection Inject 0.5 mLs into the muscle once for 1 dose.  . [DISCONTINUED] Omega 3-6-9 Fatty Acids (OMEGA 3-6-9 PO) Take 1,000 mg by mouth.    No facility-administered encounter medications on file as of 09/30/2018.     Activities of Daily Living In your present state of health, do you have any difficulty performing the following activities: 09/30/2018  Hearing? N  Vision? N  Difficulty concentrating or making decisions? Y  Walking or climbing stairs? N  Dressing or bathing? N  Doing errands, shopping? N  Preparing Food and eating ? N  Using the Toilet? N  In the past six months, have you accidently leaked urine? N  Do you have problems with loss of bowel control? N  Managing your Medications? N  Managing your Finances? N  Housekeeping or managing your Housekeeping? N  Some recent data might be hidden    Patient Care Team: Ma Hillock, DO as PCP - General (Family Medicine) [facility], [facility]  (Audiology)    Assessment:   This is a routine wellness examination for Cynthia Powell.  Exercise Activities and Dietary recommendations Current Exercise Habits: The patient does not participate in regular exercise at present, Exercise limited by: orthopedic condition(s)   Diet (meal preparation, eat out, water intake, caffeinated beverages, dairy products, fruits and vegetables): Drinks water and diet soda.   Vegetarian diet  including fish/poultry.      Goals    . Patient Stated     Maintain current health.    . Patient Stated     Maintain current health.        Fall Risk Fall Risk  09/30/2018 04/29/2017 01/28/2017 01/21/2017 03/02/2015  Falls in the past year? 0 No No No No  Number falls in past yr: 0 - - - -  Injury with Fall? 0 - - - -  Follow up Falls prevention discussed - - - -    Depression Screen PHQ 2/9 Scores 09/30/2018 04/30/2018 04/29/2017 01/28/2017  PHQ - 2 Score 0 0 0 0     Cognitive Function MMSE - Mini Mental State Exam 09/30/2018 01/28/2017  Orientation to time 5 5  Orientation to Place 5 5  Registration 3 3  Attention/ Calculation 5 5  Recall 3 3  Language- name 2 objects 2 2  Language- repeat 1 1  Language- follow 3 step command 3 3  Language- read & follow direction 1 1  Write a sentence 1 1  Copy design 1 1  Total score 30 30        Immunization History  Administered Date(s) Administered  . Influenza Split 12/26/2007, 10/21/2008, 11/15/2011  . Influenza, Quadrivalent, Recombinant, Inj, Pf 11/21/2017  . Influenza-Unspecified 11/20/2013, 10/20/2014, 11/22/2016  . MMR 09/23/2012, 10/22/2012  . Pneumococcal Conjugate-13 12/31/2011  . Pneumococcal Polysaccharide-23 11/05/2006, 01/28/2017  . Tdap 06/20/2009  . Zoster 06/25/2013     Screening Tests Health Maintenance  Topic Date Due  . COLONOSCOPY  02/26/2017  . INFLUENZA VACCINE  09/27/2018  . TETANUS/TDAP  06/21/2019  . DEXA SCAN  Completed  . Hepatitis C Screening  Completed  . PNA vac Low Risk Adult   Completed        Plan:     Schedule colonoscopy when you are ready.   Plan to have mammogram and DEXA in January 2021 at [facility].   Shingles vaccine at pharmacy.   Continue doing brain stimulating activities (puzzles, reading, adult coloring books, staying active) to keep memory sharp.   I have personally reviewed and noted the following in the patient's chart:   . Medical and social history . Use of alcohol, tobacco or illicit drugs  . Current medications and supplements . Functional ability and status . Nutritional status . Physical activity . Advanced directives . List of other physicians . Hospitalizations, surgeries, and ER visits in previous 12 months . Vitals . Screenings to include cognitive, depression, and falls . Referrals and appointments  In addition, I have reviewed and discussed with patient certain preventive protocols, quality metrics, and best practice recommendations. A written personalized care plan for preventive services as well as general preventive health recommendations were provided to patient.     Gerilyn Nestle, RN  09/30/2018  F/U with PCP 10/2018  Medical screening examination/treatment/procedure(s) were performed by non-physician practitioner and as supervising physician I was immediately available for consultation/collaboration.  I agree with above assessment and plan.  Electronically Signed by: Howard Pouch, DO [facility]

## 2018-09-30 NOTE — Patient Instructions (Addendum)
Schedule colonoscopy when you are ready.   Plan to have mammogram and DEXA in January 2021 at [facility].   Shingles vaccine at pharmacy.   Continue doing brain stimulating activities (puzzles, reading, adult coloring books, staying active) to keep memory sharp.    Fall Prevention in the Home, Adult Falls can cause injuries. They can happen to people of all ages. There are many things you can do to make your home safe and to help prevent falls. Ask for help when making these changes, if needed. What actions can I take to prevent falls? General Instructions  Use good lighting in all rooms. Replace any light bulbs that burn out.  Turn on the lights when you go into a dark area. Use night-lights.  Keep items that you use often in easy-to-reach places. Lower the shelves around your home if necessary.  Set up your furniture so you have a clear path. Avoid moving your furniture around.  Do not have throw rugs and other things on the floor that can make you trip.  Avoid walking on wet floors.  If any of your floors are uneven, fix them.  Add color or contrast paint or tape to clearly mark and help you see: ? Any grab bars or handrails. ? First and last steps of stairways. ? Where the edge of each step is.  If you use a stepladder: ? Make sure that it is fully opened. Do not climb a closed stepladder. ? Make sure that both sides of the stepladder are locked into place. ? Ask someone to hold the stepladder for you while you use it.  If there are any pets around you, be aware of where they are. What can I do in the bathroom?      Keep the floor dry. Clean up any water that spills onto the floor as soon as it happens.  Remove soap buildup in the tub or shower regularly.  Use non-skid mats or decals on the floor of the tub or shower.  Attach bath mats securely with double-sided, non-slip rug tape.  If you need to sit down in the shower, use a plastic, non-slip stool.  Install  grab bars by the toilet and in the tub and shower. Do not use towel bars as grab bars. What can I do in the bedroom?  Make sure that you have a light by your bed that is easy to reach.  Do not use any sheets or blankets that are too big for your bed. They should not hang down onto the floor.  Have a firm chair that has side arms. You can use this for support while you get dressed. What can I do in the kitchen?  Clean up any spills right away.  If you need to reach something above you, use a strong step stool that has a grab bar.  Keep electrical cords out of the way.  Do not use floor polish or wax that makes floors slippery. If you must use wax, use non-skid floor wax. What can I do with my stairs?  Do not leave any items on the stairs.  Make sure that you have a light switch at the top of the stairs and the bottom of the stairs. If you do not have them, ask someone to add them for you.  Make sure that there are handrails on both sides of the stairs, and use them. Fix handrails that are broken or loose. Make sure that handrails are as long as  the stairways.  Install non-slip stair treads on all stairs in your home.  Avoid having throw rugs at the top or bottom of the stairs. If you do have throw rugs, attach them to the floor with carpet tape.  Choose a carpet that does not hide the edge of the steps on the stairway.  Check any carpeting to make sure that it is firmly attached to the stairs. Fix any carpet that is loose or worn. What can I do on the outside of my home?  Use bright outdoor lighting.  Regularly fix the edges of walkways and driveways and fix any cracks.  Remove anything that might make you trip as you walk through a door, such as a raised step or threshold.  Trim any bushes or trees on the path to your home.  Regularly check to see if handrails are loose or broken. Make sure that both sides of any steps have handrails.  Install guardrails along the edges of  any raised decks and porches.  Clear walking paths of anything that might make someone trip, such as tools or rocks.  Have any leaves, snow, or ice cleared regularly.  Use sand or salt on walking paths during winter.  Clean up any spills in your garage right away. This includes grease or oil spills. What other actions can I take?  Wear shoes that: ? Have a low heel. Do not wear high heels. ? Have rubber bottoms. ? Are comfortable and fit you well. ? Are closed at the toe. Do not wear open-toe sandals.  Use tools that help you move around (mobility aids) if they are needed. These include: ? Canes. ? Walkers. ? Scooters. ? Crutches.  Review your medicines with your doctor. Some medicines can make you feel dizzy. This can increase your chance of falling. Ask your doctor what other things you can do to help prevent falls. Where to find more information  Centers for Disease Control and Prevention, STEADI: https://garcia.biz/  [employer] on Aging: BrainJudge.co.uk Contact a doctor if:  You are afraid of falling at home.  You feel weak, drowsy, or dizzy at home.  You fall at home. Summary  There are many simple things that you can do to make your home safe and to help prevent falls.  Ways to make your home safe include removing tripping hazards and installing grab bars in the bathroom.  Ask for help when making these changes in your home. This information is not intended to replace advice given to you by your health care provider. Make sure you discuss any questions you have with your health care provider. Document Released: 12/09/2008 Document Revised: 06/05/2018 Document Reviewed: 09/27/2016 Elsevier Patient Education  2020 [place] Maintenance, Female Adopting a healthy lifestyle and getting preventive care are important in promoting health and wellness. Ask your health care provider about:  The right schedule for you to have regular tests  and exams.  Things you can do on your own to prevent diseases and keep yourself healthy. What should I know about diet, weight, and exercise? Eat a healthy diet   Eat a diet that includes plenty of vegetables, fruits, low-fat dairy products, and lean protein.  Do not eat a lot of foods that are high in solid fats, added sugars, or sodium. Maintain a healthy weight Body mass index (BMI) is used to identify weight problems. It estimates body fat based on height and weight. Your health care provider can help determine your BMI  and help you achieve or maintain a healthy weight. Get regular exercise Get regular exercise. This is one of the most important things you can do for your health. Most adults should:  Exercise for at least 150 minutes each week. The exercise should increase your heart rate and make you sweat (moderate-intensity exercise).  Do strengthening exercises at least twice a week. This is in addition to the moderate-intensity exercise.  Spend less time sitting. Even light physical activity can be beneficial. Watch cholesterol and blood lipids Have your blood tested for lipids and cholesterol at 75 years of age, then have this test every 5 years. Have your cholesterol levels checked more often if:  Your lipid or cholesterol levels are high.  You are older than 75 years of age.  You are at high risk for heart disease. What should I know about cancer screening? Depending on your health history and family history, you may need to have cancer screening at various ages. This may include screening for:  Breast cancer.  Cervical cancer.  Colorectal cancer.  Skin cancer.  Lung cancer. What should I know about heart disease, diabetes, and high blood pressure? Blood pressure and heart disease  High blood pressure causes heart disease and increases the risk of stroke. This is more likely to develop in people who have high blood pressure readings, are of African descent, or  are overweight.  Have your blood pressure checked: ? Every 3-5 years if you are 85-68 years of age. ? Every year if you are 64 years old or older. Diabetes Have regular diabetes screenings. This checks your fasting blood sugar level. Have the screening done:  Once every three years after age 53 if you are at a normal weight and have a low risk for diabetes.  More often and at a younger age if you are overweight or have a high risk for diabetes. What should I know about preventing infection? Hepatitis B If you have a higher risk for hepatitis B, you should be screened for this virus. Talk with your health care provider to find out if you are at risk for hepatitis B infection. Hepatitis C Testing is recommended for:  Everyone born from [birth year] through 1967.  Anyone with known risk factors for hepatitis C. Sexually transmitted infections (STIs)  Get screened for STIs, including gonorrhea and chlamydia, if: ? You are sexually active and are younger than 75 years of age. ? You are older than 75 years of age and your health care provider tells you that you are at risk for this type of infection. ? Your sexual activity has changed since you were last screened, and you are at increased risk for chlamydia or gonorrhea. Ask your health care provider if you are at risk.  Ask your health care provider about whether you are at high risk for HIV. Your health care provider may recommend a prescription medicine to help prevent HIV infection. If you choose to take medicine to prevent HIV, you should first get tested for HIV. You should then be tested every 3 months for as long as you are taking the medicine. Pregnancy  If you are about to stop having your period (premenopausal) and you may become pregnant, seek counseling before you get pregnant.  Take 400 to 800 micrograms (mcg) of folic acid every day if you become pregnant.  Ask for birth control (contraception) if you want to prevent pregnancy.  Osteoporosis and menopause Osteoporosis is a disease in which the bones lose minerals  and strength with aging. This can result in bone fractures. If you are 14 years old or older, or if you are at risk for osteoporosis and fractures, ask your health care provider if you should:  Be screened for bone loss.  Take a calcium or vitamin D supplement to lower your risk of fractures.  Be given hormone replacement therapy (HRT) to treat symptoms of menopause. Follow these instructions at home: Lifestyle  Do not use any products that contain nicotine or tobacco, such as cigarettes, e-cigarettes, and chewing tobacco. If you need help quitting, ask your health care provider.  Do not use street drugs.  Do not share needles.  Ask your health care provider for help if you need support or information about quitting drugs. Alcohol use  Do not drink alcohol if: ? Your health care provider tells you not to drink. ? You are pregnant, may be pregnant, or are planning to become pregnant.  If you drink alcohol: ? Limit how much you use to 0-1 drink a day. ? Limit intake if you are breastfeeding.  Be aware of how much alcohol is in your drink. In the [place] one drink equals one 12 oz bottle of beer (355 mL), one 5 oz glass of wine (148 mL), or one 1 oz glass of hard liquor (44 mL). General instructions  Schedule regular health, dental, and eye exams.  Stay current with your vaccines.  Tell your health care provider if: ? You often feel depressed. ? You have ever been abused or do not feel safe at home. Summary  Adopting a healthy lifestyle and getting preventive care are important in promoting health and wellness.  Follow your health care provider's instructions about healthy diet, exercising, and getting tested or screened for diseases.  Follow your health care provider's instructions on monitoring your cholesterol and blood pressure. This information is not intended to replace advice given  to you by your health care provider. Make sure you discuss any questions you have with your health care provider. Document Released: 08/28/2010 Document Revised: 02/05/2018 Document Reviewed: 02/05/2018 Elsevier Patient Education  2020 [employer].

## 2018-09-30 NOTE — Progress Notes (Signed)
  I connected with patient 09/30/18 at  2:00 PM EDT by a video/audio enabled telemedicine application and verified that I am speaking with the correct person using two identifiers. Patient stated full name and DOB. Patient gave permission to continue with virtual visit. Patient's location was at home and Nurse's location was at [place] office.

## 2018-10-07 ENCOUNTER — Other Ambulatory Visit: Payer: Self-pay | Admitting: Family Medicine

## 2018-10-07 DIAGNOSIS — E782 Mixed hyperlipidemia: Secondary | ICD-10-CM

## 2018-10-07 DIAGNOSIS — E039 Hypothyroidism, unspecified: Secondary | ICD-10-CM

## 2018-10-07 DIAGNOSIS — E559 Vitamin D deficiency, unspecified: Secondary | ICD-10-CM

## 2018-10-07 DIAGNOSIS — I10 Essential (primary) hypertension: Secondary | ICD-10-CM

## 2018-10-07 DIAGNOSIS — E781 Pure hyperglyceridemia: Secondary | ICD-10-CM

## 2018-10-15 ENCOUNTER — Encounter: Payer: Self-pay | Admitting: Family Medicine

## 2018-10-23 ENCOUNTER — Encounter: Payer: Self-pay | Admitting: Internal Medicine

## 2018-10-23 ENCOUNTER — Ambulatory Visit (INDEPENDENT_AMBULATORY_CARE_PROVIDER_SITE_OTHER): Payer: Medicare Other | Admitting: Internal Medicine

## 2018-10-23 ENCOUNTER — Other Ambulatory Visit: Payer: Self-pay

## 2018-10-23 ENCOUNTER — Ambulatory Visit (HOSPITAL_BASED_OUTPATIENT_CLINIC_OR_DEPARTMENT_OTHER)
Admission: RE | Admit: 2018-10-23 | Discharge: 2018-10-23 | Disposition: A | Discharge status: Home / Self Care | Payer: Medicare Other | Source: Ambulatory Visit | Attending: Internal Medicine | Admitting: Internal Medicine

## 2018-10-23 VITALS — BP 152/73 | HR 84 | Temp 97.8°F | Resp 16 | Ht 61.0 in | Wt 127.0 lb

## 2018-10-23 DIAGNOSIS — M79674 Pain in right toe(s): Secondary | ICD-10-CM | POA: Insufficient documentation

## 2018-10-23 DIAGNOSIS — S99921A Unspecified injury of right foot, initial encounter: Secondary | ICD-10-CM | POA: Diagnosis not present

## 2018-10-23 NOTE — Progress Notes (Signed)
Pre visit review using our clinic review tool, if applicable. No additional management support is needed unless otherwise documented below in the visit note. 
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
## 2018-10-23 NOTE — Patient Instructions (Signed)
Please get your x-rays  Rest, keep the foot elevated  Ice it twice a day  Tylenol  500 mg OTC 2 tabs a day every 8 hours as needed for pain  Call if not gradually better in the next few days

## 2018-11-04 ENCOUNTER — Ambulatory Visit: Payer: Medicare Other | Admitting: Family Medicine

## 2018-11-11 ENCOUNTER — Other Ambulatory Visit: Payer: Self-pay

## 2018-11-11 ENCOUNTER — Encounter: Payer: Self-pay | Admitting: Family Medicine

## 2018-11-11 ENCOUNTER — Ambulatory Visit (INDEPENDENT_AMBULATORY_CARE_PROVIDER_SITE_OTHER): Payer: Medicare Other | Admitting: Family Medicine

## 2018-11-11 VITALS — BP 116/82 | HR 76 | Temp 98.0°F | Resp 18 | Ht 61.0 in | Wt 125.1 lb

## 2018-11-11 DIAGNOSIS — Z5320 Procedure and treatment not carried out because of patient's decision for unspecified reasons: Secondary | ICD-10-CM

## 2018-11-11 DIAGNOSIS — E782 Mixed hyperlipidemia: Secondary | ICD-10-CM | POA: Diagnosis not present

## 2018-11-11 DIAGNOSIS — E559 Vitamin D deficiency, unspecified: Secondary | ICD-10-CM | POA: Diagnosis not present

## 2018-11-11 DIAGNOSIS — N183 Chronic kidney disease, stage 3 unspecified: Secondary | ICD-10-CM | POA: Insufficient documentation

## 2018-11-11 DIAGNOSIS — E039 Hypothyroidism, unspecified: Secondary | ICD-10-CM | POA: Diagnosis not present

## 2018-11-11 DIAGNOSIS — M810 Age-related osteoporosis without current pathological fracture: Secondary | ICD-10-CM

## 2018-11-11 DIAGNOSIS — I10 Essential (primary) hypertension: Secondary | ICD-10-CM | POA: Diagnosis not present

## 2018-11-11 LAB — LIPID PANEL
Cholesterol: 189 mg/dL (ref 0–200)
HDL: 43.4 mg/dL (ref >39.00)
LDL Cholesterol: 116 mg/dL — ABNORMAL HIGH (ref 0–99)
NonHDL: 145.66
Total CHOL/HDL Ratio: 4
Triglycerides: 150 mg/dL — ABNORMAL HIGH (ref 0.0–149.0)
VLDL: 30 mg/dL (ref 0.0–40.0)

## 2018-11-11 LAB — BASIC METABOLIC PANEL
BUN: 28 mg/dL — ABNORMAL HIGH (ref 6–23)
CO2: 27 mEq/L (ref 19–32)
Calcium: 10.1 mg/dL (ref 8.4–10.5)
Chloride: 105 mEq/L (ref 96–112)
Creatinine, Ser: 0.99 mg/dL (ref 0.40–1.20)
GFR: 54.64 mL/min — ABNORMAL LOW (ref >60.00)
Glucose, Bld: 79 mg/dL (ref 70–99)
Potassium: 4.5 mEq/L (ref 3.5–5.1)
Sodium: 141 mEq/L (ref 135–145)

## 2018-11-11 MED ORDER — LISINOPRIL 30 MG PO TABS: ORAL_TABLET | ORAL | 1 refills | Status: DC

## 2018-11-11 MED ORDER — LEVOTHYROXINE SODIUM 88 MCG PO TABS: 88.0000 ug | ORAL_TABLET | Freq: Every day | ORAL | 1 refills | Status: DC

## 2018-11-11 NOTE — Progress Notes (Signed)
Cynthia Powell , 1945-03-22, 73 y.o., female MRN: [id number] Patient Care Team    Relationship Specialty Notifications Start End  Ma Hillock, DO PCP - General Family Medicine  03/02/15   Odette Fraction  Optometry  09/30/18   [facility]  Audiology  09/30/18     Chief Complaint  Patient presents with  . Hypertension    Pt is doing well with no complaints. Pts daughter was dx with cancer recently     Subjective:  Hypertension/hyperlipidemia- statin declined:Patient reports compliance with lisinopril 20 mg QD. Patient denies chest pain, shortness of breath, dizziness or lower extremity edema.  Pt does  take daily baby ASA. Pt is not prescribed statin (refuses statin). She I staking fenofibrate and is tolerating.  Cbc: 04/2018 WNL CMP: 04/2018 CKD 3 GFR 52 Lipid: 04/2018- LDL 140 TSH: 04/2018 WNL Diet: does not closely monitor.  Exercise: does not routinely exercise.  RF: HTN, HLD, FHX stroke (mother)  Hypothyroid:Pt reports compliance with 88 mcg synthroid daily on an empty stomach. TSH normal 04/2018. She denies symptoms.   Vit d deficiency/osteoporosis: Pt continues to take 5000 u vit d a day. 04/2017 WNL  Depression screen [facility] 2/9 09/30/2018 04/30/2018 04/29/2017 01/28/2017 01/21/2017  Decreased Interest 0 0 0 0 0  Down, Depressed, Hopeless 0 0 0 0 0  PHQ - 2 Score 0 0 0 0 0    Allergies  Allergen Reactions  . Iodinated Diagnostic Agents     Other reaction(s): UNCONSCIOUSNESS   Social History   Tobacco Use  . Smoking status: Passive Smoke Exposure - Never Smoker  . Smokeless tobacco: Never Used  Substance Use Topics  . Alcohol use: No   Past Medical History:  Diagnosis Date  . Abnormal Pap smear of vagina 1995,2007   pt had h/o abnl pap; does not desire future screening/PAP  . Allergy    seasonal and food  . Arthritis    osteoarthritis  . Benign neoplasm of colon   . Cancer ([place]) 1995   cervical (cone bx)  . Glaucoma   . History of  chickenpox   . History of shingles    above her right knee, gets frequently.  Marland Kitchen Hyperlipidemia    using Krill oil; refuses statin  . Hypertension   . Osteoporosis   . Sensory hearing loss, bilateral 2017   Bilateral hearing aids. [facility]  . Thyroid disease 1980  . Trigger finger   . Wears hearing aid in both ears    Past Surgical History:  Procedure Laterality Date  . BREAST SURGERY  1990   biopsy  . CESAREAN SECTION    . COLPOSCOPY    . TONSILLECTOMY  1954   Family History  Problem Relation Age of Onset  . Arthritis Mother   . Diabetes Mother   . Stroke Mother   . Hypertension Mother   . Cancer Father   . Heart disease Father   . Hypertension Father   . Pernicious anemia Father   . Breast cancer Sister        breast cancer  . Melanoma Daughter        melanoma 2009  . Diabetes Daughter   . Mental retardation Daughter   . Hearing loss Maternal Grandfather   . Depression Daughter        second daughter  . OCD Daughter        second daughter   Allergies as of 11/11/2018  Reactions   Iodinated Diagnostic Agents    Other reaction(s): UNCONSCIOUSNESS      Medication List       Accurate as of November 11, 2018  9:45 AM. If you have any questions, ask your nurse or doctor.        aspirin EC 81 MG tablet Take 81 mg by mouth daily.   cholecalciferol 1000 units tablet Commonly known as: VITAMIN D Take 5,000 Units by mouth once a week.   fenofibrate 145 MG tablet Commonly known as: TRICOR Take 1 tablet (145 mg total) by mouth daily.   levothyroxine 88 MCG tablet Commonly known as: SYNTHROID TAKE 1 TABLET DAILY BEFORE BREAKFAST   lisinopril 30 MG tablet Commonly known as: ZESTRIL TAKE 1 TABLET DAILY   Lumigan 0.01 % Soln Generic drug: bimatoprost   multivitamin-iron-minerals-folic acid chewable tablet Frequency:   Dosage:0.0     Instructions:  Note:   VITAMIN B 12 PO Take by mouth.       All past medical history, surgical history,  allergies, family history, immunizations andmedications were updated in the EMR today and reviewed under the history and medication portions of their EMR.     ROS: Negative, with the exception of above mentioned in HPI   Objective:  BP 116/82 (BP Location: Left Arm, Patient Position: Sitting, Cuff Size: Normal)   Pulse 76   Temp 98 F (36.7 C) (Temporal)   Resp 18   Ht 5\' 1"  (1.549 m)   Wt 125 lb 2 oz (56.8 kg)   SpO2 99%   BMI 23.64 kg/m  Body mass index is 23.64 kg/m.  Gen: Afebrile. No acute distress. Nontoxic in appearance. Thin caucasian female.  HENT: AT. [place].  Eyes:Pupils Equal Round Reactive to light, Extraocular movements intact,  Conjunctiva without redness, discharge or icterus. Neck/lymp/endocrine: Supple,no lymphadenopathy, no thyromegaly CV: RRR 1/6 SM, no edema, +2/4 P posterior tibialis pulses Chest: CTAB, no wheeze or crackles Abd: Soft. NTND. BS present. no Masses palpated.  Skin: no rashes, purpura or petechiae.  Neuro:  Normal gait. PERLA. EOMi. Alert. Oriented x3  Psych: Normal affect, dress and demeanor. Normal speech. Normal thought content and judgment.  No exam data present No results found. No results found for this or any previous visit (from the past 24 hour(s)).  Assessment/Plan: Cynthia Powell is a 73 y.o. female present for OV for   Hypothyroidism, unspecified hypothyroidism type - TSH utd- labs due in March - refilled synthroid for her.   Osteoporosis/vit d def: - declined fosamax use. Continue  vit d 5000u daily. 04/29/2017 NL (59)  Essential hypertension, benign/hypertriglyceridemia- statin refused/CKD-3 - Stable continue  lisinopril 30 mg QD.  - Continue fenofibrate- statin declined- changed diet some.  - exercise > 150 minutes a week.  - pt counseled on the importance of taking care of herself. Her mother died of a stroke.  - continue ASA 81 mg QD. - f/u 6 months    Reviewed expectations re: course of current medical issues.   Discussed self-management of symptoms.  Outlined signs and symptoms indicating need for more acute intervention.  Patient verbalized understanding and all questions were answered.  Patient received an After-Visit Summary.    No orders of the defined types were placed in this encounter.  > 25 minutes spent with patient, >50% of time spent face to face   Declined flu vaccine.   Note is dictated utilizing voice recognition software. Although note has been proof read prior to signing, occasional typographical  errors still can be missed. If any questions arise, please do not hesitate to call for verification.   electronically signed by:  Howard Pouch, DO  [place]

## 2018-11-11 NOTE — Patient Instructions (Addendum)
You look great.  I am sorry to hear about your daughter.  Follow up in [facility] March- we will collect all your labs that visit.    Your BP looks great.

## 2018-12-01 ENCOUNTER — Ambulatory Visit (INDEPENDENT_AMBULATORY_CARE_PROVIDER_SITE_OTHER): Payer: Medicare Other

## 2018-12-01 ENCOUNTER — Other Ambulatory Visit: Payer: Self-pay

## 2018-12-01 DIAGNOSIS — Z23 Encounter for immunization: Secondary | ICD-10-CM | POA: Diagnosis not present

## 2019-03-21 ENCOUNTER — Ambulatory Visit: Payer: Medicare Other | Attending: Internal Medicine

## 2019-03-21 DIAGNOSIS — Z23 Encounter for immunization: Secondary | ICD-10-CM | POA: Insufficient documentation

## 2019-03-21 NOTE — Progress Notes (Signed)
   Covid-19 Vaccination Clinic  Name:  Cynthia Powell    MRN: [id number] DOB: 11/27/1945  03/21/2019  [name] was observed post Covid-19 immunization for 15 minutes without incidence. She was provided with Vaccine Information Sheet and instruction to access the V-Safe system.   [name] was instructed to call 911 with any severe reactions post vaccine: Marland Kitchen Difficulty breathing  . Swelling of your face and throat  . A fast heartbeat  . A bad rash all over your body  . Dizziness and weakness    Immunizations Administered    Name Date Dose VIS Date Route   Pfizer COVID-19 Vaccine 03/21/2019  1:00 PM 0.3 mL 02/06/2019 Intramuscular   Manufacturer: [place]   Lot: [id number]   [place]: [id number]

## 2019-04-04 ENCOUNTER — Other Ambulatory Visit: Payer: Self-pay | Admitting: Family Medicine

## 2019-04-04 DIAGNOSIS — E781 Pure hyperglyceridemia: Secondary | ICD-10-CM

## 2019-04-06 DIAGNOSIS — H401131 Primary open-angle glaucoma, bilateral, mild stage: Secondary | ICD-10-CM | POA: Diagnosis not present

## 2019-04-06 NOTE — Telephone Encounter (Signed)
Pt was called and does need refill on medication. She is due for her Thyroid recheck in March. She was scheduled for this appt. Refill was sent to mail order pharmacy.

## 2019-04-11 ENCOUNTER — Ambulatory Visit: Payer: TRICARE For Life (TFL)

## 2019-04-18 ENCOUNTER — Ambulatory Visit: Payer: Medicare Other | Attending: Internal Medicine

## 2019-04-18 DIAGNOSIS — Z23 Encounter for immunization: Secondary | ICD-10-CM | POA: Insufficient documentation

## 2019-04-18 NOTE — Progress Notes (Signed)
   Covid-19 Vaccination Clinic  Name:  Cynthia Powell    MRN: [id number] DOB: 1945-04-27  04/18/2019  [name] was observed post Covid-19 immunization for 15 minutes without incidence. She was provided with Vaccine Information Sheet and instruction to access the V-Safe system.   [name] was instructed to call 911 with any severe reactions post vaccine: Marland Kitchen Difficulty breathing  . Swelling of your face and throat  . A fast heartbeat  . A bad rash all over your body  . Dizziness and weakness    Immunizations Administered    Name Date Dose VIS Date Route   Pfizer COVID-19 Vaccine 04/18/2019  1:23 PM 0.3 mL 02/06/2019 Intramuscular   Manufacturer: [place]   Lot: [id number]   [place]: [id number]

## 2019-04-28 ENCOUNTER — Other Ambulatory Visit: Payer: Self-pay

## 2019-04-28 ENCOUNTER — Ambulatory Visit (INDEPENDENT_AMBULATORY_CARE_PROVIDER_SITE_OTHER): Payer: Medicare Other | Admitting: Family Medicine

## 2019-04-28 ENCOUNTER — Encounter: Payer: Self-pay | Admitting: Family Medicine

## 2019-04-28 VITALS — BP 119/74 | HR 82 | Temp 97.9°F | Resp 16 | Ht 61.0 in | Wt 124.0 lb

## 2019-04-28 DIAGNOSIS — Z5320 Procedure and treatment not carried out because of patient's decision for unspecified reasons: Secondary | ICD-10-CM

## 2019-04-28 DIAGNOSIS — I10 Essential (primary) hypertension: Secondary | ICD-10-CM

## 2019-04-28 DIAGNOSIS — E782 Mixed hyperlipidemia: Secondary | ICD-10-CM

## 2019-04-28 DIAGNOSIS — N1832 Chronic kidney disease, stage 3b: Secondary | ICD-10-CM | POA: Diagnosis not present

## 2019-04-28 DIAGNOSIS — E039 Hypothyroidism, unspecified: Secondary | ICD-10-CM | POA: Diagnosis not present

## 2019-04-28 DIAGNOSIS — E781 Pure hyperglyceridemia: Secondary | ICD-10-CM

## 2019-04-28 DIAGNOSIS — E559 Vitamin D deficiency, unspecified: Secondary | ICD-10-CM

## 2019-04-28 MED ORDER — LISINOPRIL 30 MG PO TABS: ORAL_TABLET | ORAL | 1 refills | Status: DC

## 2019-04-28 MED ORDER — FENOFIBRATE 145 MG PO TABS: 145.0000 mg | ORAL_TABLET | Freq: Every day | ORAL | 1 refills | Status: DC

## 2019-04-28 NOTE — Patient Instructions (Addendum)
Great to see you today.  I will call you with lab results and refill your thyroid medicine after results.   Check BP, 2 hours after taking medication, and if < 110/50 routinely>> call in and provide recordings to [place] so we can alter your regimen. Too low of BP can also cause fatigue.

## 2019-04-28 NOTE — Progress Notes (Signed)
This visit occurred during the SARS-CoV-2 public health emergency.  Safety protocols were in place, including screening questions prior to the visit, additional usage of staff PPE, and extensive cleaning of exam room while observing appropriate contact time as indicated for disinfecting solutions.     Cynthia Powell , March 27, 1945, 74 y.o., female MRN: [id number] Patient Care Team    Relationship Specialty Notifications Start End  Ma Hillock, DO PCP - General Family Medicine  03/02/15   Odette Fraction  Optometry  09/30/18   [facility]  Audiology  09/30/18     Chief Complaint  Patient presents with  . Hypertension  . Hypothyroidism     Subjective: Cynthia Powell is a 74 y.o. female present for [facility] Hypertension/hyperlipidemia- statin declined:Patient reports compliance with lisinopril 30 mg QD. Patient denies chest pain, shortness of breath, dizziness or lower extremity edema.  Pt does  take daily baby ASA. Pt is not prescribed statin (refuses statin). She I staking fenofibrate and is tolerating.  Cbc: 04/2018 WNL CMP: 10/2018 CKD 3 GFR 54 Lipid: 10/2018 TSH: 04/2018 WNL Diet: does not closely monitor.  Exercise: does not routinely exercise.  RF: HTN, HLD, FHX stroke (mother)  Hypothyroid:Pt reports compliance with 88 mcg synthroid daily on an empty stomach. TSH normal 04/2018. She denies symptoms.   Vit d deficiency/osteoporosis: Pt continues to take 5000 u vit d a day. 04/2017 WNL  Depression screen [facility] 2/9 09/30/2018 04/30/2018 04/29/2017 01/28/2017 01/21/2017  Decreased Interest 0 0 0 0 0  Down, Depressed, Hopeless 0 0 0 0 0  PHQ - 2 Score 0 0 0 0 0    Allergies  Allergen Reactions  . Iodinated Diagnostic Agents     Other reaction(s): UNCONSCIOUSNESS   Social History   Tobacco Use  . Smoking status: Passive Smoke Exposure - Never Smoker  . Smokeless tobacco: Never Used  Substance Use Topics  . Alcohol use: No   Past Medical History:  Diagnosis Date   . Abnormal Pap smear of vagina 1995,2007   pt had h/o abnl pap; does not desire future screening/PAP  . Allergy    seasonal and food  . Arthritis    osteoarthritis  . Benign neoplasm of colon   . Cancer ([place]) 1995   cervical (cone bx)  . Glaucoma   . History of chickenpox   . History of shingles    above her right knee, gets frequently.  Marland Kitchen Hyperlipidemia    using Krill oil; refuses statin  . Hypertension   . Osteoporosis   . Sensory hearing loss, bilateral 2017   Bilateral hearing aids. [facility]  . Thyroid disease 1980  . Trigger finger   . Wears hearing aid in both ears    Past Surgical History:  Procedure Laterality Date  . BREAST SURGERY  1990   biopsy  . CESAREAN SECTION    . COLPOSCOPY    . TONSILLECTOMY  1954   Family History  Problem Relation Age of Onset  . Arthritis Mother   . Diabetes Mother   . Stroke Mother   . Hypertension Mother   . Cancer Father   . Heart disease Father   . Hypertension Father   . Pernicious anemia Father   . Breast cancer Sister        breast cancer  . Melanoma Daughter        melanoma 2009  . Diabetes Daughter   . Mental retardation Daughter   .  Hearing loss Maternal Grandfather   . Depression Daughter        second daughter  . Lymphoma Daughter        Brain mets- on chemo ([facility])  . OCD Daughter    Allergies as of 04/28/2019      Reactions   Iodinated Diagnostic Agents    Other reaction(s): UNCONSCIOUSNESS      Medication List       Accurate as of April 28, 2019  1:39 PM. If you have any questions, ask your nurse or doctor.        aspirin EC 81 MG tablet Take 81 mg by mouth daily.   cholecalciferol 1000 units tablet Commonly known as: VITAMIN D Take 5,000 Units by mouth once a week.   Fish Oil 1000 MG Caps Take by mouth.   levothyroxine 88 MCG tablet Commonly known as: SYNTHROID Take 1 tablet (88 mcg total) by mouth daily before breakfast.   lisinopril 30 MG tablet Commonly known as: ZESTRIL TAKE 1  TABLET DAILY   Lumigan 0.01 % Soln Generic drug: bimatoprost   multivitamin-iron-minerals-folic acid chewable tablet Frequency:   Dosage:0.0     Instructions:  Note:   Tricor 145 MG tablet Generic drug: fenofibrate TAKE 1 TABLET DAILY   VITAMIN B 12 PO Take by mouth.       All past medical history, surgical history, allergies, family history, immunizations andmedications were updated in the EMR today and reviewed under the history and medication portions of their EMR.     ROS: Negative, with the exception of above mentioned in HPI   Objective:  BP 119/74 (BP Location: Left Arm, Patient Position: Sitting, Cuff Size: Normal)   Pulse 82   Temp 97.9 F (36.6 C) (Temporal)   Resp 16   Ht 5\' 1"  (1.549 m)   Wt 124 lb (56.2 kg)   SpO2 97%   BMI 23.43 kg/m  Body mass index is 23.43 kg/m.  Gen: Afebrile. No acute distress. Nontoxic in appearance. Pleasant caucasian female.  HENT: AT. [place].  Eyes:Pupils Equal Round Reactive to light, Extraocular movements intact,  Conjunctiva without redness, discharge or icterus. Neck/lymp/endocrine: Supple,no lymphadenopathy, no thyromegaly CV: RRR no murmur, no edema Chest: CTAB, no wheeze or crackles Skin: no rashes, purpura or petechiae.  Neuro:  Normal gait. PERLA. EOMi. Alert. Oriented x3  Psych: Normal affect, dress and demeanor. Normal speech. Normal thought content and judgment.  No exam data present No results found. No results found for this or any previous visit (from the past 24 hour(s)).  Assessment/Plan: Cynthia Powell is a 74 y.o. female present for OV for   Hypothyroidism, unspecified hypothyroidism type - TSH, t4 collected today.  - refills will be provided after results at appropriate dose.    Osteoporosis/vit d def: - declined fosamax use. Continue  vit d 5000u daily. 04/29/2017 NL (52)  Essential hypertension, benign/hypertriglyceridemia- statin refused/CKD-3 - Stable.  continue  lisinopril 30 mg QD. She is to check  her BP and if < 110/50 with med she will call in and lower dose prescribed (she has some fatigue) - cbc collected today - Continue fenofibrate- statin declined- changed diet some.  - exercise > 150 minutes a week.  - pt counseled on the importance of taking care of herself. Her mother died of a stroke.  - continue ASA 81 mg QD. - f/u 6 months      Reviewed expectations re: course of current medical issues.  Discussed self-management of symptoms.  Outlined signs and symptoms indicating need for more acute intervention.  Patient verbalized understanding and all questions were answered.  Patient received an After-Visit Summary.    No orders of the defined types were placed in this encounter.   Note is dictated utilizing voice recognition software. Although note has been proof read prior to signing, occasional typographical errors still can be missed. If any questions arise, please do not hesitate to call for verification.   electronically signed by:  Howard Pouch, DO  [place]

## 2019-04-29 LAB — CBC
HCT: 36.3 % (ref 36.0–46.0)
Hemoglobin: 12.1 g/dL (ref 12.0–15.0)
MCHC: 33.4 g/dL (ref 30.0–36.0)
MCV: 86.2 fl (ref 78.0–100.0)
Platelets: 328 10*3/uL (ref 150.0–400.0)
RBC: 4.21 Mil/uL (ref 3.87–5.11)
RDW: 12.8 % (ref 11.5–15.5)
WBC: 8.1 10*3/uL (ref 4.0–10.5)

## 2019-04-29 LAB — BASIC METABOLIC PANEL
BUN: 32 mg/dL — ABNORMAL HIGH (ref 6–23)
CO2: 25 mEq/L (ref 19–32)
Calcium: 10.3 mg/dL (ref 8.4–10.5)
Chloride: 106 mEq/L (ref 96–112)
Creatinine, Ser: 1.08 mg/dL (ref 0.40–1.20)
GFR: 49.36 mL/min — ABNORMAL LOW (ref >60.00)
Glucose, Bld: 89 mg/dL (ref 70–99)
Potassium: 4.8 mEq/L (ref 3.5–5.1)
Sodium: 139 mEq/L (ref 135–145)

## 2019-04-29 LAB — TSH: TSH: 1.63 u[IU]/mL (ref 0.35–4.50)

## 2019-04-29 LAB — T4, FREE: Free T4: 1.24 ng/dL (ref 0.60–1.60)

## 2019-05-06 ENCOUNTER — Ambulatory Visit: Payer: TRICARE For Life (TFL)

## 2019-05-26 ENCOUNTER — Other Ambulatory Visit: Payer: Self-pay | Admitting: Family Medicine

## 2019-05-26 DIAGNOSIS — I10 Essential (primary) hypertension: Secondary | ICD-10-CM

## 2019-05-26 DIAGNOSIS — E559 Vitamin D deficiency, unspecified: Secondary | ICD-10-CM

## 2019-05-26 DIAGNOSIS — E039 Hypothyroidism, unspecified: Secondary | ICD-10-CM

## 2019-05-26 DIAGNOSIS — E782 Mixed hyperlipidemia: Secondary | ICD-10-CM

## 2019-08-10 DIAGNOSIS — H401131 Primary open-angle glaucoma, bilateral, mild stage: Secondary | ICD-10-CM | POA: Diagnosis not present

## 2019-09-14 DIAGNOSIS — H35033 Hypertensive retinopathy, bilateral: Secondary | ICD-10-CM | POA: Diagnosis not present

## 2019-10-23 NOTE — Progress Notes (Signed)
Phone [phone] Virtual visit via Video note   Subjective:  Chief complaint: Chief Complaint  Patient presents with  . Animal Bite    personal cat -- 10/20/19 , right arm .    This visit type was conducted due to national recommendations for restrictions regarding the COVID-19 Pandemic (e.g. social distancing).  This format is felt to be most appropriate for this patient at this time balancing risks to patient and risks to population by having him in for in person visit.  No physical exam was performed (except for noted visual exam or audio findings with Telehealth visits).    Our team/I connected with Lyndel Safe at 12:40 PM EDT by a video enabled telemedicine application (doxy.me or caregility through epic) and verified that I am speaking with the correct person using two identifiers.  Location patient: Home[place] Location provider: [facility], office Persons participating in the virtual visit:  patient  Our team/I discussed the limitations of evaluation and management by telemedicine and the availability of in person appointments. In light of current covid-19 pandemic, patient also understands that we are trying to protect them by minimizing in office contact if at all possible.  The patient expressed consent for telemedicine visit and agreed to proceed. Patient understands insurance will be billed.   Past Medical History-  Patient Active Problem List   Diagnosis Date Noted  . CKD (chronic kidney disease) stage 3, GFR 30-59 ml/min 11/11/2018  . Statin declined 05/01/2018  . Vitamin D deficiency 10/29/2017  . 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 Spider veins of both lower extremities 09/12/2015  . Difficulty sleeping 09/12/2015  . Toenail deformity 08/31/2015  . Uses hearing aid 03/08/2015  . Osteoporosis 03/08/2015  . Hypothyroidism 03/02/2015  . Essential hypertension 03/02/2015  . Hyperlipidemia 03/02/2015  . Family dynamics problem 03/02/2015    Medications-  reviewed and updated Current Outpatient Medications  Medication Sig Dispense Refill  . amoxicillin-clavulanate (AUGMENTIN) 875-125 MG tablet Take 1 tablet by mouth 2 (two) times daily for 7 days. 14 tablet 0  . aspirin EC 81 MG tablet Take 81 mg by mouth daily.    . cholecalciferol (VITAMIN D) 1000 units tablet Take 5,000 Units by mouth once a week.     . Cyanocobalamin (VITAMIN B 12 PO) Take by mouth.    . fenofibrate (TRICOR) 145 MG tablet Take 1 tablet (145 mg total) by mouth daily. 90 tablet 1  . lisinopril (ZESTRIL) 30 MG tablet TAKE 1 TABLET DAILY 90 tablet 1  . LUMIGAN 0.01 % SOLN     . multivitamin-iron-minerals-folic acid (CENTRUM) chewable tablet Frequency:   Dosage:0.0     Instructions:  Note:    . Omega-3 Fatty Acids (FISH OIL) 1000 MG CAPS Take by mouth.    . SYNTHROID 88 MCG tablet TAKE 1 TABLET DAILY BEFORE BREAKFAST 90 tablet 1   No current facility-administered medications for this visit.     Objective:  BP (!) 170/86   Pulse 78   SpO2 95%  self reported vitals Gen: NAD, resting comfortably Lungs: nonlabored, normal respiratory rate a 30 every morning Skin: appears dry, no obvious rash     Assessment and Plan   #  Cat Bite S: patient states she has a 10 year old cat that was sitting on arm of love seat- cat was trying to get her attention while she was on phone. Not sure what the trigger was other than possibly her not paying attention. Cat rached across and bit her  right arm- that was the arm further away from the cat. Cat has never showed aggressive behavior other than when she was much younger. Indoor cat- no possible rabies exposure. Immunized from rabies.   Bite occurred Tuesday morning- got a fair amount of purulent buildup at one of the marks- that has now drained. Area still somewhat elevated and tender- around one tender about the size of penny  A/P: 74 year old female with cat bite on right forearm.  Initially had some purulence but this has appropriately  drained.  This was unprovoked but this is an indoor cat that is vaccinated for rabies.  I am going to cover her with Augmentin for 7 days-if she has worsening redness on her arm or other concerning symptoms such as fever should seek care immediately  #hypertension S: medication: Lisinopril 30 mg BP Readings from Last 3 Encounters:  10/24/19 (!) 170/86  04/28/19 119/74  11/11/18 116/82  A/P: Poor control today but patient just took medication before our visit-she agrees to follow-up with Dr. Raoul Pitch if blood pressure does not trend back down-reviewed last 2 blood pressures and they were very well controlled on medication.   Recommended follow up: As needed symptoms worsen or fail to improve Future Appointments  Date Time Provider [place]  11/25/2019 12:45 PM [facility]    Lab/Order associations:   ICD-10-CM   1. Cat bite of right forearm, initial encounter  S51.851A    W55.01XA   2. Essential hypertension  I10     Meds ordered this encounter  Medications  . amoxicillin-clavulanate (AUGMENTIN) 875-125 MG tablet    Sig: Take 1 tablet by mouth 2 (two) times daily for 7 days.    Dispense:  14 tablet    Refill:  0    Return precautions advised.  Garret Reddish, MD

## 2019-10-24 ENCOUNTER — Encounter: Payer: Self-pay | Admitting: Family Medicine

## 2019-10-24 ENCOUNTER — Telehealth (INDEPENDENT_AMBULATORY_CARE_PROVIDER_SITE_OTHER): Payer: Medicare Other | Admitting: Family Medicine

## 2019-10-24 VITALS — BP 170/86 | HR 78

## 2019-10-24 DIAGNOSIS — W5501XA Bitten by cat, initial encounter: Secondary | ICD-10-CM

## 2019-10-24 DIAGNOSIS — S51851A Open bite of right forearm, initial encounter: Secondary | ICD-10-CM

## 2019-10-24 DIAGNOSIS — I10 Essential (primary) hypertension: Secondary | ICD-10-CM | POA: Diagnosis not present

## 2019-10-24 MED ORDER — AMOXICILLIN-POT CLAVULANATE 875-125 MG PO TABS: 1.0000 | ORAL_TABLET | Freq: Two times a day (BID) | ORAL | 0 refills | Status: AC

## 2019-10-31 ENCOUNTER — Other Ambulatory Visit: Payer: Self-pay | Admitting: Family Medicine

## 2019-10-31 DIAGNOSIS — E782 Mixed hyperlipidemia: Secondary | ICD-10-CM

## 2019-10-31 DIAGNOSIS — I10 Essential (primary) hypertension: Secondary | ICD-10-CM

## 2019-10-31 DIAGNOSIS — E559 Vitamin D deficiency, unspecified: Secondary | ICD-10-CM

## 2019-10-31 DIAGNOSIS — E039 Hypothyroidism, unspecified: Secondary | ICD-10-CM

## 2019-11-03 ENCOUNTER — Ambulatory Visit: Payer: Medicare Other

## 2019-11-24 NOTE — Progress Notes (Signed)
Subjective:   Cynthia Powell is a 74 y.o. female who presents for Medicare Annual (Subsequent) preventive examination.  I connected with Arilynn today by telephone and verified that I am speaking with the correct person using two identifiers. Location patient: home Location provider: work Persons participating in the virtual visit: patient, Marine scientist.    I discussed the limitations, risks, security and privacy concerns of performing an evaluation and management service by telephone and the availability of in person appointments. I also discussed with the patient that there may be a patient responsible charge related to this service. The patient expressed understanding and verbally consented to this telephonic visit.    Interactive audio and video telecommunications were attempted between this provider and patient, however failed, due to patient having technical difficulties OR patient did not have access to video capability.  We continued and completed visit with audio only.  Some vital signs may be absent or patient reported.   Time Spent with patient on telephone encounter: 30 minutes  Review of Systems     Cardiac Risk Factors include: advanced age (>13mn, >>45women);hypertension;dyslipidemia     Objective:    Today's Vitals   11/25/19 1243  Weight: 125 lb (56.7 kg)  Height: 5' 1"  (1.549 m)   Body mass index is 23.62 kg/m.  Advanced Directives 11/25/2019 09/30/2018 01/28/2017  Does Patient Have a Medical Advance Directive? Yes Yes Yes  Type of [place] will [place] will [place] will  Copy of [place]? No - copy requested Yes - validated most recent copy scanned in chart (See row information) No - copy requested    Current Medications (verified) Outpatient Encounter Medications as of 11/25/2019  Medication Sig  . aspirin EC 81 MG tablet Take 81 mg by mouth  daily.  . cholecalciferol (VITAMIN D) 1000 units tablet Take 5,000 Units by mouth once a week.   . Cyanocobalamin (VITAMIN B 12 PO) Take by mouth.  . fenofibrate (TRICOR) 145 MG tablet Take 1 tablet (145 mg total) by mouth daily.  .Marland Kitchenlisinopril (ZESTRIL) 30 MG tablet TAKE 1 TABLET DAILY  . LUMIGAN 0.01 % SOLN   . multivitamin-iron-minerals-folic acid (CENTRUM) chewable tablet Frequency:   Dosage:0.0     Instructions:  Note:  . SYNTHROID 88 MCG tablet TAKE 1 TABLET DAILY BEFORE BREAKFAST  . Omega-3 Fatty Acids (FISH OIL) 1000 MG CAPS Take by mouth. (Patient not taking: Reported on 11/25/2019)   No facility-administered encounter medications on file as of 11/25/2019.    Allergies (verified) Iodinated diagnostic agents   History: Past Medical History:  Diagnosis Date  . Abnormal Pap smear of vagina 1995,2007   pt had h/o abnl pap; does not desire future screening/PAP  . Allergy    seasonal and food  . Arthritis    osteoarthritis  . Benign neoplasm of colon   . [place]   cervical ([place] bx)  . Glaucoma   . History of chickenpox   . History of shingles    above her right knee, gets frequently.  .Marland KitchenHyperlipidemia    using Krill oil; refuses statin  . Hypertension   . Osteoporosis   . Sensory hearing loss, bilateral 2017   Bilateral hearing aids. [facility]  . Thyroid disease 1980  . Trigger finger   . Wears hearing aid in both ears    Past Surgical History:  Procedure Laterality Date  . BREAST SURGERY  1990   biopsy  . CESAREAN SECTION    . COLPOSCOPY    . TONSILLECTOMY  1954   Family History  Problem Relation Age of Onset  . Arthritis Mother   . Diabetes Mother   . Stroke Mother   . Hypertension Mother   . Cancer Father   . Heart disease Father   . Hypertension Father   . Pernicious anemia Father   . Breast cancer Sister        breast cancer  . Melanoma Daughter        melanoma 2009  . Diabetes Daughter   . Mental retardation Daughter   . Hearing  loss Maternal Grandfather   . Depression Daughter        second daughter  . Lymphoma Daughter        Brain mets- on chemo ([facility])  . OCD Daughter    Social History   Socioeconomic History  . Marital status: Married    Spouse name: Not on file  . Number of children: Not on file  . Years of education: Not on file  . Highest education level: Not on file  Occupational History  . Not on file  Tobacco Use  . Smoking status: Passive Smoke Exposure - Never Smoker  . Smokeless tobacco: Never Used  Vaping Use  . Vaping Use: Never used  Substance and Sexual Activity  . Alcohol use: No  . Drug use: No  . Sexual activity: Never  Other Topics Concern  . Not on file  Social History Narrative  . Not on file   Social Determinants of Health   Financial Resource Strain: Low Risk   . Difficulty of Paying Living Expenses: Not hard at all  Food Insecurity: No Food Insecurity  . Worried About Charity fundraiser in the Last Year: Never true  . Ran Out of Food in the Last Year: Never true  Transportation Needs: No Transportation Needs  . Lack of Transportation (Medical): No  . Lack of Transportation (Non-Medical): No  Physical Activity: Inactive  . Days of Exercise per Week: 0 days  . Minutes of Exercise per Session: 0 min  Stress: No Stress Concern Present  . Feeling of Stress : Only a little  Social Connections: Socially Integrated  . Frequency of Communication with Friends and Family: More than three times a week  . Frequency of Social Gatherings with Friends and Family: More than three times a week  . Attends Religious Services: 1 to 4 times per year  . Active Member of Clubs or Organizations: Yes  . Attends Archivist Meetings: 1 to 4 times per year  . Marital Status: Married    Tobacco Counseling Counseling given: Not Answered   Clinical Intake:  Pre-visit preparation completed: Yes  Pain : No/denies pain     Nutritional Status: BMI of 19-24   Normal Nutritional Risks: None Diabetes: No  How often do you need to have someone help you when you read instructions, pamphlets, or other written materials from your doctor or pharmacy?: 1 - Never What is the last grade level you completed in school?: College graduate  Diabetic?No  Interpreter Needed?: No  Information entered by :: Caroleen Hamman LPN   Activities of Daily Living In your present state of health, do you have any difficulty performing the following activities: 11/25/2019  Hearing? Y  Comment hearing aids  Vision? N  Difficulty concentrating or making decisions? N  Walking or climbing stairs? N  Dressing  or bathing? N  Doing errands, shopping? N  Preparing Food and eating ? N  Using the Toilet? N  In the past six months, have you accidently leaked urine? Y  Do you have problems with loss of bowel control? N  Managing your Medications? N  Managing your Finances? N  Housekeeping or managing your Housekeeping? N  Some recent data might be hidden    Patient Care Team: Ma Hillock, DO as PCP - General (Family Medicine) [facility], [facility] (Audiology)  Indicate any recent Medical Services you may have received from other than [facility] providers in the past year (date may be approximate).     Assessment:   This is a routine wellness examination for Hollynn.  Hearing/Vision screen  Hearing Screening   125Hz  250Hz  500Hz  1000Hz  2000Hz  3000Hz  4000Hz  6000Hz  8000Hz   Right ear:           Left ear:           Comments: Hearing aids  Vision Screening Comments: Wears glasses Last eye exam-07/2019-Dr. Joya San  Dietary issues and exercise activities discussed: Current Exercise Habits: The patient does not participate in regular exercise at present, Exercise limited by: None identified  Goals    . Patient Stated     Maintain current health.      Depression Screen PHQ 2/9 Scores 11/25/2019 09/30/2018 04/30/2018 04/29/2017  01/28/2017 01/21/2017 03/02/2015  PHQ - 2 Score 0 0 0 0 0 0 0    Fall Risk Fall Risk  11/25/2019 09/30/2018 04/29/2017 01/28/2017 01/21/2017  Falls in the past year? 1 0 No No No  Number falls in past yr: 0 0 - - -  Injury with Fall? 0 0 - - -  Follow up Falls prevention discussed Falls prevention discussed - - -    Any stairs in or around the home? Yes  If so, are there any without handrails? No  Home free of loose throw rugs in walkways, pet beds, electrical cords, etc? Yes  Adequate lighting in your home to reduce risk of falls? Yes   ASSISTIVE DEVICES UTILIZED TO PREVENT FALLS:  Life alert? No  Use of a cane, walker or w/c? No  Grab bars in the bathroom? No  Shower chair or bench in shower? Yes  Elevated toilet seat or a handicapped toilet? No   TIMED UP AND GO:  Was the test performed? No . Telephonic visit  Cognitive Function:No cognitive impairment noted.  MMSE - Mini Mental State Exam 09/30/2018 01/28/2017  Orientation to time 5 5  Orientation to Place 5 5  Registration 3 3  Attention/ Calculation 5 5  Recall 3 3  Language- name 2 objects 2 2  Language- repeat 1 1  Language- follow 3 step command 3 3  Language- read & follow direction 1 1  Write a sentence 1 1  Copy design 1 1  Total score 30 30        Immunizations Immunization History  Administered Date(s) Administered  . Fluad Quad(high Dose 65+) 12/01/2018  . Influenza Split 12/26/2007, 10/21/2008, 11/15/2011  . Influenza, Quadrivalent, Recombinant, Inj, Pf 11/21/2017  . Influenza-Unspecified 11/20/2013, 10/20/2014, 11/22/2016  . MMR 09/23/2012, 10/22/2012  . PFIZER SARS-COV-2 Vaccination 03/21/2019, 04/18/2019  . Pneumococcal Conjugate-13 12/31/2011  . Pneumococcal Polysaccharide-23 11/05/2006, 01/28/2017  . Tdap 10/02/2018  . Zoster 06/25/2013  . Zoster Recombinat (Shingrix) 10/02/2018, 12/10/2018    TDAP status: Up to date   Flu Vaccine Status:Due- Patient plans to  get vaccine in the next  week.  Pneumococcal vaccine status: Up to date   Covid-19 vaccine status: Completed vaccines  Qualifies for Shingles Vaccine? No   Zostavax completed Yes   Shingrix Completed?: Yes  Screening Tests Health Maintenance  Topic Date Due  . COLONOSCOPY  02/26/2017  . INFLUENZA VACCINE  09/27/2019  . TETANUS/TDAP  10/01/2028  . DEXA SCAN  Completed  . COVID-19 Vaccine  Completed  . Hepatitis C Screening  Completed  . PNA vac Low Risk Adult  Completed    Health Maintenance  Health Maintenance Due  Topic Date Due  . COLONOSCOPY  02/26/2017  . INFLUENZA VACCINE  09/27/2019    Colorectal cancer screening: No longer required.    Mammogram Status: Declined. Patient instructed to call the office to schedule if she changes her mind.  Bone Density status: Completed 03/22/2017. Results reflect: Bone density results: OSTEOPOROSIS. Repeat every 2 years.  Patient declined.  Lung Cancer Screening: (Low Dose CT Chest recommended if Age 1-80 years, 30 pack-year currently smoking OR have quit w/in 15years.) does not qualify.    Additional Screening:  Hepatitis C Screening: Completed 04/30/2018  Vision Screening: Recommended annual ophthalmology exams for early detection of glaucoma and other disorders of the eye. Is the patient up to date with their annual eye exam?  Yes  Who is the provider or what is the name of the office in which the patient attends annual eye exams? Dr. Joya San  Dental Screening: Recommended annual dental exams for proper oral hygiene  Community Resource Referral / Chronic Care Management: CRR required this visit?  No   CCM required this visit?  No      Plan:     I have personally reviewed and noted the following in the patient's chart:   . Medical and social history . Use of alcohol, tobacco or illicit drugs  . Current medications and supplements . Functional ability and status . Nutritional status . Physical activity . Advanced directives . List of  other physicians . Hospitalizations, surgeries, and ER visits in previous 12 months . Vitals . Screenings to include cognitive, depression, and falls . Referrals and appointments  In addition, I have reviewed and discussed with patient certain preventive protocols, quality metrics, and best practice recommendations. A written personalized care plan for preventive services as well as general preventive health recommendations were provided to patient.    Due to this being a telephonic visit, the after visit summary with patients personalized plan was offered to patient via mail or my-chart.  Patient would like to access on my-chart.  Marta Antu, LPN   4/46/2863  Nurse Health Advisor  Nurse Notes: None

## 2019-11-25 ENCOUNTER — Ambulatory Visit (INDEPENDENT_AMBULATORY_CARE_PROVIDER_SITE_OTHER): Payer: Medicare Other

## 2019-11-25 VITALS — Ht 61.0 in | Wt 125.0 lb

## 2019-11-25 DIAGNOSIS — Z0000 Encounter for general adult medical examination without abnormal findings: Secondary | ICD-10-CM

## 2019-11-25 NOTE — Patient Instructions (Signed)
[name] , Thank you for taking time to complete your Medicare Wellness Visit. I appreciate your ongoing commitment to your health goals. Please review the following plan we discussed and let me know if I can assist you in the future.   Screening recommendations/referrals: Colonoscopy: No longer indicated Mammogram: Declined today.Please call the office when you are ready to schedule. Bone Density: Declined today. Recommended yearly ophthalmology/optometry visit for glaucoma screening and checkup Recommended yearly dental visit for hygiene and checkup  Vaccinations: Influenza vaccine: Due- May obtain vaccine at our office or your local pharmacy. Pneumococcal vaccine: Completed vaccines Tdap vaccine: Up to Date- Due-10/01/2028 Shingles vaccine: Completed vaccines  Covid-19:Completed vaccines  Advanced directives: Please bring a copy for your chart.  Conditions/risks identified: See problem list  Next appointment: Follow up in one year for your annual wellness visit 11/30/2020 @ 12:45   Preventive Care 65 Years and Older, Female Preventive care refers to lifestyle choices and visits with your health care provider that can promote health and wellness. What does preventive care include?  A yearly physical exam. This is also called an annual well check.  Dental exams once or twice a year.  Routine eye exams. Ask your health care provider how often you should have your eyes checked.  Personal lifestyle choices, including:  Daily care of your teeth and gums.  Regular physical activity.  Eating a healthy diet.  Avoiding tobacco and drug use.  Limiting alcohol use.  Practicing safe sex.  Taking low-dose aspirin every day.  Taking vitamin and mineral supplements as recommended by your health care provider. What happens during an annual well check? The services and screenings done by your health care provider during your annual well check will depend on your age, overall  health, lifestyle risk factors, and family history of disease. Counseling  Your health care provider may ask you questions about your:  Alcohol use.  Tobacco use.  Drug use.  Emotional well-being.  Home and relationship well-being.  Sexual activity.  Eating habits.  History of falls.  Memory and ability to understand (cognition).  Work and work Statistician.  Reproductive health. Screening  You may have the following tests or measurements:  Height, weight, and BMI.  Blood pressure.  Lipid and cholesterol levels. These may be checked every 5 years, or more frequently if you are over 39 years old.  Skin check.  Lung cancer screening. You may have this screening every year starting at age 34 if you have a 30-pack-year history of smoking and currently smoke or have quit within the past 15 years.  Fecal occult blood test (FOBT) of the stool. You may have this test every year starting at age 32.  Flexible sigmoidoscopy or colonoscopy. You may have a sigmoidoscopy every 5 years or a colonoscopy every 10 years starting at age 76.  Hepatitis C blood test.  Hepatitis B blood test.  Sexually transmitted disease (STD) testing.  Diabetes screening. This is done by checking your blood sugar (glucose) after you have not eaten for a while (fasting). You may have this done every 1-3 years.  Bone density scan. This is done to screen for osteoporosis. You may have this done starting at age 22.  Mammogram. This may be done every 1-2 years. Talk to your health care provider about how often you should have regular mammograms. Talk with your health care provider about your test results, treatment options, and if necessary, the need for more tests. Vaccines  Your health care provider may  recommend certain vaccines, such as:  Influenza vaccine. This is recommended every year.  Tetanus, diphtheria, and acellular pertussis (Tdap, Td) vaccine. You may need a Td booster every 10  years.  Zoster vaccine. You may need this after age 19.  Pneumococcal 13-valent conjugate (PCV13) vaccine. One dose is recommended after age 68.  Pneumococcal polysaccharide (PPSV23) vaccine. One dose is recommended after age 60. Talk to your health care provider about which screenings and vaccines you need and how often you need them. This information is not intended to replace advice given to you by your health care provider. Make sure you discuss any questions you have with your health care provider. Document Released: 03/11/2015 Document Revised: 11/02/2015 Document Reviewed: 12/14/2014 Elsevier Interactive Patient Education  2017 [employer] Prevention in the Home Falls can cause injuries. They can happen to people of all ages. There are many things you can do to make your home safe and to help prevent falls. What can I do on the outside of my home?  Regularly fix the edges of walkways and driveways and fix any cracks.  Remove anything that might make you trip as you walk through a door, such as a raised step or threshold.  Trim any bushes or trees on the path to your home.  Use bright outdoor lighting.  Clear any walking paths of anything that might make someone trip, such as rocks or tools.  Regularly check to see if handrails are loose or broken. Make sure that both sides of any steps have handrails.  Any raised decks and porches should have guardrails on the edges.  Have any leaves, snow, or ice cleared regularly.  Use sand or salt on walking paths during winter.  Clean up any spills in your garage right away. This includes oil or grease spills. What can I do in the bathroom?  Use night lights.  Install grab bars by the toilet and in the tub and shower. Do not use towel bars as grab bars.  Use non-skid mats or decals in the tub or shower.  If you need to sit down in the shower, use a plastic, non-slip stool.  Keep the floor dry. Clean up any water that  spills on the floor as soon as it happens.  Remove soap buildup in the tub or shower regularly.  Attach bath mats securely with double-sided non-slip rug tape.  Do not have throw rugs and other things on the floor that can make you trip. What can I do in the bedroom?  Use night lights.  Make sure that you have a light by your bed that is easy to reach.  Do not use any sheets or blankets that are too big for your bed. They should not hang down onto the floor.  Have a firm chair that has side arms. You can use this for support while you get dressed.  Do not have throw rugs and other things on the floor that can make you trip. What can I do in the kitchen?  Clean up any spills right away.  Avoid walking on wet floors.  Keep items that you use a lot in easy-to-reach places.  If you need to reach something above you, use a strong step stool that has a grab bar.  Keep electrical cords out of the way.  Do not use floor polish or wax that makes floors slippery. If you must use wax, use non-skid floor wax.  Do not have throw rugs and  other things on the floor that can make you trip. What can I do with my stairs?  Do not leave any items on the stairs.  Make sure that there are handrails on both sides of the stairs and use them. Fix handrails that are broken or loose. Make sure that handrails are as long as the stairways.  Check any carpeting to make sure that it is firmly attached to the stairs. Fix any carpet that is loose or worn.  Avoid having throw rugs at the top or bottom of the stairs. If you do have throw rugs, attach them to the floor with carpet tape.  Make sure that you have a light switch at the top of the stairs and the bottom of the stairs. If you do not have them, ask someone to add them for you. What else can I do to help prevent falls?  Wear shoes that:  Do not have high heels.  Have rubber bottoms.  Are comfortable and fit you well.  Are closed at the  toe. Do not wear sandals.  If you use a stepladder:  Make sure that it is fully opened. Do not climb a closed stepladder.  Make sure that both sides of the stepladder are locked into place.  Ask someone to hold it for you, if possible.  Clearly mark and make sure that you can see:  Any grab bars or handrails.  First and last steps.  Where the edge of each step is.  Use tools that help you move around (mobility aids) if they are needed. These include:  Canes.  Walkers.  Scooters.  Crutches.  Turn on the lights when you go into a dark area. Replace any light bulbs as soon as they burn out.  Set up your furniture so you have a clear path. Avoid moving your furniture around.  If any of your floors are uneven, fix them.  If there are any pets around you, be aware of where they are.  Review your medicines with your doctor. Some medicines can make you feel dizzy. This can increase your chance of falling. Ask your doctor what other things that you can do to help prevent falls. This information is not intended to replace advice given to you by your health care provider. Make sure you discuss any questions you have with your health care provider. Document Released: 12/09/2008 Document Revised: 07/21/2015 Document Reviewed: 03/19/2014 Elsevier Interactive Patient Education  2017 [employer].

## 2019-12-07 DIAGNOSIS — H401131 Primary open-angle glaucoma, bilateral, mild stage: Secondary | ICD-10-CM | POA: Diagnosis not present

## 2019-12-25 DIAGNOSIS — Z23 Encounter for immunization: Secondary | ICD-10-CM | POA: Diagnosis not present

## 2020-02-11 DIAGNOSIS — H401131 Primary open-angle glaucoma, bilateral, mild stage: Secondary | ICD-10-CM | POA: Diagnosis not present

## 2020-03-14 ENCOUNTER — Encounter: Payer: Self-pay | Admitting: Family Medicine

## 2020-03-15 ENCOUNTER — Ambulatory Visit: Payer: Medicare Other | Admitting: Family Medicine

## 2020-04-05 ENCOUNTER — Encounter: Payer: Self-pay | Admitting: Family Medicine

## 2020-04-05 ENCOUNTER — Other Ambulatory Visit: Payer: Self-pay

## 2020-04-05 ENCOUNTER — Ambulatory Visit (INDEPENDENT_AMBULATORY_CARE_PROVIDER_SITE_OTHER): Payer: Medicare Other | Admitting: Family Medicine

## 2020-04-05 VITALS — BP 148/73 | HR 88 | Temp 98.9°F | Ht 61.0 in | Wt 124.0 lb

## 2020-04-05 DIAGNOSIS — N1832 Chronic kidney disease, stage 3b: Secondary | ICD-10-CM | POA: Diagnosis not present

## 2020-04-05 DIAGNOSIS — F4320 Adjustment disorder, unspecified: Secondary | ICD-10-CM | POA: Insufficient documentation

## 2020-04-05 DIAGNOSIS — E559 Vitamin D deficiency, unspecified: Secondary | ICD-10-CM | POA: Diagnosis not present

## 2020-04-05 DIAGNOSIS — I10 Essential (primary) hypertension: Secondary | ICD-10-CM | POA: Diagnosis not present

## 2020-04-05 DIAGNOSIS — M2550 Pain in unspecified joint: Secondary | ICD-10-CM | POA: Diagnosis not present

## 2020-04-05 DIAGNOSIS — E039 Hypothyroidism, unspecified: Secondary | ICD-10-CM | POA: Diagnosis not present

## 2020-04-05 DIAGNOSIS — E782 Mixed hyperlipidemia: Secondary | ICD-10-CM

## 2020-04-05 DIAGNOSIS — M25512 Pain in left shoulder: Secondary | ICD-10-CM | POA: Diagnosis not present

## 2020-04-05 DIAGNOSIS — E538 Deficiency of other specified B group vitamins: Secondary | ICD-10-CM | POA: Diagnosis not present

## 2020-04-05 DIAGNOSIS — R5383 Other fatigue: Secondary | ICD-10-CM | POA: Diagnosis not present

## 2020-04-05 DIAGNOSIS — J309 Allergic rhinitis, unspecified: Secondary | ICD-10-CM | POA: Insufficient documentation

## 2020-04-05 DIAGNOSIS — M791 Myalgia, unspecified site: Secondary | ICD-10-CM | POA: Diagnosis not present

## 2020-04-05 DIAGNOSIS — B029 Zoster without complications: Secondary | ICD-10-CM | POA: Insufficient documentation

## 2020-04-05 DIAGNOSIS — M5430 Sciatica, unspecified side: Secondary | ICD-10-CM | POA: Insufficient documentation

## 2020-04-05 DIAGNOSIS — M76899 Other specified enthesopathies of unspecified lower limb, excluding foot: Secondary | ICD-10-CM | POA: Insufficient documentation

## 2020-04-05 DIAGNOSIS — Z760 Encounter for issue of repeat prescription: Secondary | ICD-10-CM | POA: Insufficient documentation

## 2020-04-05 HISTORY — DX: Pain in unspecified joint: M25.50

## 2020-04-05 HISTORY — DX: Zoster without complications: B02.9

## 2020-04-05 NOTE — Patient Instructions (Signed)
Rotator Cuff Tear  A rotator cuff tear is a partial or complete tear of the cord-like bands (tendons) that connect muscle to bone in the rotator cuff. The rotator cuff is a group of muscles and tendons that surround the shoulder joint and keep the upper arm bone (humerus) in the shoulder socket. The tear can occur suddenly (acute tear) or can develop over a long period of time (chronic tear). What are the causes? Acute tears may be caused by:  A fall, especially on an outstretched arm.  Lifting very heavy objects with a jerking motion. Chronic tears may be caused by overuse of the muscles. This may happen in sports, physical work, or activities in which your arm repeatedly moves over your head. What increases the risk? This condition is more likely to occur in:  Athletes and workers who frequently use their shoulder or reach over their head. This may include activities such as: ? Tennis. ? Baseball and softball. ? Swimming and rowing. ? Weight lifting. ? Architect work. ? Painting.  People who smoke.  Older people who have arthritis or poor blood supply. These can make the muscles and tendons weaker. What are the signs or symptoms? Symptoms of this condition depend on the type and severity of the injury:  An acute tear may include a sudden tearing feeling, followed by severe pain that goes from your upper shoulder, down your arm, and toward your elbow.  A chronic tear includes a gradual weakness and decreased shoulder motion as the pain gets worse. The pain is usually worse at night. Both types may have symptoms such as:  Pain that spreads (radiates) from the shoulder to the upper arm.  Swelling and tenderness in front of the shoulder.  Decreased range of motion.  Pain when: ? Reaching, pulling, or lifting the arm above the head. ? Lowering the arm from above the head.  Not being able to raise your arm out to the side.  Difficulty placing the arm behind your back. How  is this diagnosed? This condition is diagnosed with a medical history and physical exam. Imaging tests may also be done, including:  X-rays.  MRI.  Ultrasound.  CT or MR arthrogram. During this test, a contrast material is injected into your shoulder, and then images are taken. How is this treated? Treatment for this condition depends on the type and severity of the condition. In less severe cases, treatment may include:  Rest. This may be done with a sling that holds the shoulder still (immobilization). Your health care provider may also recommend avoiding activities that involve lifting your arm over your head.  Icing the shoulder.  Anti-inflammatory medicines, such as aspirin or ibuprofen.  Strengthening and stretching exercises. Your health care provider may recommend specific exercises to improve your range of motion and strengthen your shoulder. In more severe cases, treatment may include:  Physical therapy.  Steroid injections.  Surgery. Follow these instructions at home: Managing pain, stiffness, and swelling  If directed, put ice on the injured area. To do this: ? If you have a removable sling, remove it as told by your health care provider. ? Put ice in a plastic bag. ? Place a towel between your skin and the bag. ? Leave the ice on for 20 minutes, 2-3 times a day. ? Remove the ice if your skin turns bright red. This is very important. If you cannot feel pain, heat, or cold, you have a greater risk of damage to the area.  Raise (  elevate) the injured area above the level of your heart while you are lying down.  Find a comfortable sleeping position or sleep on a recliner, if available.  Move your fingers often to reduce stiffness and swelling.  Once the swelling has gone down, your health care provider may direct you to apply heat to relax the muscles. Use the heat source that your health care provider recommends, such as a moist heat pack or a heating pad. ? Place  a towel between your skin and the heat source. ? Leave the heat on for 20-30 minutes. ? Remove the heat if your skin turns bright red. This is especially important if you are unable to feel pain, heat, or cold. You have a greater risk of getting burned.      If you have a sling:  Wear the sling as told by your health care provider. Remove it only as told by your health care provider.  Loosen the sling if your fingers tingle, become numb, or turn cold and blue.  Keep the sling clean.  If the sling is not waterproof: ? Do not let it get wet. ? Cover it with a watertight covering when you take a bath or a shower. Activity  Rest your shoulder as told by your health care provider.  Return to your normal activities as told by your health care provider. Ask your health care provider what activities are safe for you.  Ask your health care provider when it is safe to drive if you have a sling on your arm.  Do any exercises or stretches as told by your health care provider. General instructions  Take over-the-counter and prescription medicines only as told by your health care provider.  Do not use any products that contain nicotine or tobacco, such as cigarettes, e-cigarettes, and chewing tobacco. If you need help quitting, ask your health care provider.  Keep all follow-up visits. This is important. Contact a health care provider if:  Your pain gets worse.  You have new pain in your arm, hands, or fingers.  Medicine does not help your pain. Get help right away if:  Your arm, hand, or fingers are numb or tingling.  Your arm, hand, or fingers are swollen or painful or they turn white or blue.  Your hand or fingers on your injured arm are colder than your other hand. Summary  A rotator cuff tear is a partial or complete tear of the cord-like bands (tendons) that connect muscle to bone in the rotator cuff.  The tear can occur suddenly (acute tear) or can develop over a long  period of time (chronic tear).  Treatment generally includes rest, anti-inflammatory medicines, and icing. In some cases, physical therapy and steroid injections may be needed. In severe cases, surgery may be needed. This information is not intended to replace advice given to you by your health care provider. Make sure you discuss any questions you have with your health care provider. Document Revised: 06/17/2019 Document Reviewed: 06/17/2019 Elsevier Patient Education  2021 [employer].

## 2020-04-05 NOTE — Progress Notes (Signed)
This visit occurred during the SARS-CoV-2 public health emergency.  Safety protocols were in place, including screening questions prior to the visit, additional usage of staff PPE, and extensive cleaning of exam room while observing appropriate contact time as indicated for disinfecting solutions.    Cynthia Powell , [birth year], 75 y.o., female MRN: [id number] Patient Care Team    Relationship Specialty Notifications Start End  Ma Hillock, DO PCP - General Family Medicine  03/02/15   Odette Fraction  Optometry  09/30/18   [facility]  Audiology  09/30/18     Chief Complaint  Patient presents with  . Shoulder Pain    Pt c/o left shoulder pain x 2 mos; limits ROM     Subjective: Pt presents for an OV with complaints of left shoulder pain of 2 months  duration.  She denies any injury that she is aware of.  She denies any increase in activity around that time or heavy lifting.  She has never had an injury to her left shoulder in the past.  She has never had any shoulder surgeries.  She points to the right [facility] joint and deltoid as the location of her discomfort.  Pain is worse when attempting to reach behind her back such as trying to fasten her bra and when attempting to raise her arm above her head.  She states she is unable to raise her arm above her shoulder level.  Pain also occurs when attempting to lay on her left side.  She has not taken anything for her discomfort.  She has a history of osteoarthritis.  No family history of inflammatory arthritis.  Also endorses multiple joints that are causing her discomfort, not associated with her left shoulder pain.  She is also endorsing fatigue.  She is under a lot of stress.  She had similar complaints a few years back, that seemed to self resolve.  Depression screen [facility] 2/9 04/05/2020 11/25/2019 09/30/2018 04/30/2018 04/29/2017  Decreased Interest 0 0 0 0 0  Down, Depressed, Hopeless 0 0 0 0 0  PHQ - 2 Score 0 0 0 0 0     Allergies  Allergen Reactions  . Iodinated Diagnostic Agents     Other reaction(s): UNCONSCIOUSNESS   Social History   Social History Narrative  . Not on file   Past Medical History:  Diagnosis Date  . Abnormal Pap smear of vagina 1995,2007   pt had h/o abnl pap; does not desire future screening/PAP  . Allergy    seasonal and food  . Arthritis    osteoarthritis  . Benign neoplasm of colon   . Cancer ([place]) 1995   cervical (cone bx)  . Glaucoma   . History of chickenpox   . History of shingles    above her right knee, gets frequently.  Marland Kitchen Hyperlipidemia    using Krill oil; refuses statin  . Hypertension   . Osteoporosis   . Sensory hearing loss, bilateral 2017   Bilateral hearing aids. [facility]  . Thyroid disease 1980  . Trigger finger   . Wears hearing aid in both ears    Past Surgical History:  Procedure Laterality Date  . BREAST SURGERY  1990   biopsy  . CESAREAN SECTION    . COLPOSCOPY    . TONSILLECTOMY  1954   Family History  Problem Relation Age of Onset  . Arthritis Mother   . Diabetes Mother   . Stroke Mother   .  Hypertension Mother   . Cancer Father   . Heart disease Father   . Hypertension Father   . Pernicious anemia Father   . Breast cancer Sister        breast cancer  . Melanoma Daughter        melanoma 2009  . Diabetes Daughter   . Mental retardation Daughter   . Hearing loss Maternal Grandfather   . Depression Daughter        second daughter  . Lymphoma Daughter        Brain mets- on chemo ([facility])  . OCD Daughter    Allergies as of 04/05/2020      Reactions   Iodinated Diagnostic Agents    Other reaction(s): UNCONSCIOUSNESS      Medication List       Accurate as of April 05, 2020 11:59 PM. If you have any questions, ask your nurse or doctor.        aspirin EC 81 MG tablet Take 81 mg by mouth daily.   cholecalciferol 1000 units tablet Commonly known as: VITAMIN D Take 5,000 Units by mouth once a week.    fenofibrate 145 MG tablet Commonly known as: Tricor Take 1 tablet (145 mg total) by mouth daily.   Fish Oil 1000 MG Caps Take by mouth.   levothyroxine 88 MCG tablet Commonly known as: Synthroid Take 1 tablet (88 mcg total) by mouth daily before breakfast. What changed: how much to take Changed by: Howard Pouch, DO   lisinopril 30 MG tablet Commonly known as: ZESTRIL TAKE 1 TABLET DAILY   Lumigan 0.01 % Soln Generic drug: bimatoprost   multivitamin-iron-minerals-folic acid chewable tablet Frequency:   Dosage:0.0     Instructions:  Note:   VITAMIN B 12 PO Take by mouth.       All past medical history, surgical history, allergies, family history, immunizations andmedications were updated in the EMR today and reviewed under the history and medication portions of their EMR.     ROS: Negative, with the exception of above mentioned in HPI   Objective:  BP (!) 148/73   Pulse 88   Temp 98.9 F (37.2 C) (Oral)   Ht 5' 1"  (1.549 m)   Wt 124 lb (56.2 kg)   SpO2 96%   BMI 23.43 kg/m  Body mass index is 23.43 kg/m. Gen: Afebrile. No acute distress. Nontoxic in appearance, well developed, well nourished.  HENT: AT. [place]. Eyes:Pupils Equal Round Reactive to light, Extraocular movements intact,  Conjunctiva without redness, discharge or icterus. MSK: Left shoulder-no erythema, no soft tissue swelling.  Tender to palpation over left biceps tendon insertion.  Tender to palpation over AC joint.  Unable to abduct arm above shoulder height.  Positive empty cans test.  Positive cans test.  Positive liftoff test. Neuro: Normal gait. PERLA. EOMi. Alert. Oriented x3  Psych: Normal affect, dress and demeanor. Normal speech. Normal thought content and judgment.  No exam data present No results found. Results for orders placed or performed in visit on 04/05/20 (from the past 24 hour(s))  Comp Met (CMET)     Status: Abnormal   Collection Time: 04/05/20  1:38 PM  Result Value Ref Range    Glucose, Bld 114 (H) 65 - 99 mg/dL   BUN 30 (H) 7 - 25 mg/dL   Creat 1.09 (H) 0.60 - 0.93 mg/dL   BUN/Creatinine Ratio 28 (H) 6 - 22 (calc)   Sodium 142 135 - 146 mmol/L   Potassium 4.1 3.5 -  5.3 mmol/L   Chloride 106 98 - 110 mmol/L   CO2 24 20 - 32 mmol/L   Calcium 10.2 8.6 - 10.4 mg/dL   Total Protein 7.5 6.1 - 8.1 g/dL   Albumin 4.5 3.6 - 5.1 g/dL   Globulin 3.0 1.9 - 3.7 g/dL (calc)   AG Ratio 1.5 1.0 - 2.5 (calc)   Total Bilirubin 0.3 0.2 - 1.2 mg/dL   Alkaline phosphatase (APISO) 44 37 - 153 U/L   AST 24 10 - 35 U/L   ALT 18 6 - 29 U/L  Iron, TIBC and Ferritin Panel     Status: Abnormal   Collection Time: 04/05/20  1:38 PM  Result Value Ref Range   Iron 96 45 - 160 mcg/dL   TIBC 488 (H) 250 - 450 mcg/dL (calc)   %SAT 20 16 - 45 % (calc)   Ferritin 71 16 - 288 ng/mL  Sedimentation rate     Status: None   Collection Time: 04/05/20  1:38 PM  Result Value Ref Range   Sed Rate 9 0 - 30 mm/h  Cyclic citrul peptide antibody, IgG ([facility])     Status: None   Collection Time: 04/05/20  1:38 PM  Result Value Ref Range   Cyclic Citrullin Peptide Ab <16 UNITS  Rheumatoid Factor     Status: None   Collection Time: 04/05/20  1:38 PM  Result Value Ref Range   Rhuematoid fact SerPl-aCnc <14 <14 IU/mL  CBC w/Diff     Status: None   Collection Time: 04/05/20  1:38 PM  Result Value Ref Range   WBC 8.4 3.8 - 10.8 Thousand/uL   RBC 4.25 3.80 - 5.10 Million/uL   Hemoglobin 12.5 11.7 - 15.5 g/dL   HCT 36.7 35.0 - 45.0 %   MCV 86.4 80.0 - 100.0 fL   MCH 29.4 27.0 - 33.0 pg   MCHC 34.1 32.0 - 36.0 g/dL   RDW 12.1 11.0 - 15.0 %   Platelets 323 140 - 400 Thousand/uL   MPV 10.8 7.5 - 12.5 fL   Neutro Abs 5,662 1,500 - 7,800 cells/uL   Lymphs Abs 2,083 850 - 3,900 cells/uL   Absolute Monocytes 479 200 - 950 cells/uL   Eosinophils Absolute 126 15 - 500 cells/uL   Basophils Absolute 50 0 - 200 cells/uL   Neutrophils Relative % 67.4 %   Total Lymphocyte 24.8 %   Monocytes Relative 5.7 %    Eosinophils Relative 1.5 %   Basophils Relative 0.6 %  TSH     Status: None   Collection Time: 04/05/20  1:38 PM  Result Value Ref Range   TSH 1.25 0.40 - 4.50 mIU/L  Vitamin D (25 hydroxy)     Status: None   Collection Time: 04/05/20  1:38 PM  Result Value Ref Range   Vit D, 25-Hydroxy 32 30 - 100 ng/mL    Assessment/Plan: SANTOSHA JIVIDEN is a 75 y.o. female present for OV for  Hypothyroidism, unspecified type - TSH> we will recheck thyroid today since she is having fatigue symptoms Stage 3b chronic kidney disease ([place]a) -Patient was strongly encouraged to increase her hydration.  She does not drink water. -We will attempt to avoid long-term NSAIDs if possible. -GFR stable at 53 fatigue/Myalgia/B12 deficiency/polyarthralgia/Vitamin D deficiency Discussed with patient we can start work-up on her other complaints today.  However she will need to follow-up to review if any abnormalities. - Iron, TIBC and Ferritin Panel - Sedimentation rate - Cyclic citrul peptide  antibody, IgG ([facility]) - Rheumatoid Factor - ANA, IFA Comprehensive Panel-([facility]) - Vitamin D (25 hydroxy) - CBC w/Diff - B12  Acute pain of left shoulder Patient exam is consistent with possible rotator injury versus impingement. Diclofenac twice daily with food. Patient was encouraged to keep range of motion movements continued as long as not causing severe pain, in order to avoid adhesive capsulitis possibilities.  Will refer to orthopedics for further evaluation - Ambulatory referral to Orthopedic Surgery   Reviewed expectations re: course of current medical issues.  Discussed self-management of symptoms.  Outlined signs and symptoms indicating need for more acute intervention.  Patient verbalized understanding and all questions were answered.  Patient received an After-Visit Summary.    Orders Placed This Encounter  Procedures  . Comp Met (CMET)  . Iron, TIBC and Ferritin Panel  . Sedimentation rate  .  Cyclic citrul peptide antibody, IgG ([facility])  . Rheumatoid Factor  . ANA, IFA Comprehensive Panel-([facility])  . CBC w/Diff  . TSH  . Vitamin D (25 hydroxy)  . B12  . Ambulatory referral to Orthopedic Surgery   Meds ordered this encounter  Medications  . levothyroxine (SYNTHROID) 88 MCG tablet    Sig: Take 1 tablet (88 mcg total) by mouth daily before breakfast.    Dispense:  90 tablet    Refill:  1    Referral Orders     Ambulatory referral to Orthopedic Surgery   Note is dictated utilizing voice recognition software. Although note has been proof read prior to signing, occasional typographical errors still can be missed. If any questions arise, please do not hesitate to call for verification.   electronically signed by:  Howard Pouch, DO  [place]

## 2020-04-06 ENCOUNTER — Telehealth: Payer: Self-pay | Admitting: Family Medicine

## 2020-04-06 ENCOUNTER — Encounter: Payer: Self-pay | Admitting: Family Medicine

## 2020-04-06 LAB — COMPREHENSIVE METABOLIC PANEL
AG Ratio: 1.5 (calc) (ref 1.0–2.5)
ALT: 18 U/L (ref 6–29)
AST: 24 U/L (ref 10–35)
Albumin: 4.5 g/dL (ref 3.6–5.1)
Alkaline phosphatase (APISO): 44 U/L (ref 37–153)
BUN/Creatinine Ratio: 28 (calc) — ABNORMAL HIGH (ref 6–22)
BUN: 30 mg/dL — ABNORMAL HIGH (ref 7–25)
CO2: 24 mmol/L (ref 20–32)
Calcium: 10.2 mg/dL (ref 8.6–10.4)
Chloride: 106 mmol/L (ref 98–110)
Creat: 1.09 mg/dL — ABNORMAL HIGH (ref 0.60–0.93)
Globulin: 3 g/dL (calc) (ref 1.9–3.7)
Glucose, Bld: 114 mg/dL — ABNORMAL HIGH (ref 65–99)
Potassium: 4.1 mmol/L (ref 3.5–5.3)
Sodium: 142 mmol/L (ref 135–146)
Total Bilirubin: 0.3 mg/dL (ref 0.2–1.2)
Total Protein: 7.5 g/dL (ref 6.1–8.1)

## 2020-04-06 LAB — CYCLIC CITRUL PEPTIDE ANTIBODY, IGG: Cyclic Citrullin Peptide Ab: <16 UNITS

## 2020-04-06 LAB — CBC WITH DIFFERENTIAL/PLATELET
Absolute Monocytes: 479 cells/uL (ref 200–950)
Basophils Absolute: 50 cells/uL (ref 0–200)
Basophils Relative: 0.6 %
Eosinophils Absolute: 126 cells/uL (ref 15–500)
Eosinophils Relative: 1.5 %
HCT: 36.7 % (ref 35.0–45.0)
Hemoglobin: 12.5 g/dL (ref 11.7–15.5)
Lymphs Abs: 2083 cells/uL (ref 850–3900)
MCH: 29.4 pg (ref 27.0–33.0)
MCHC: 34.1 g/dL (ref 32.0–36.0)
MCV: 86.4 fL (ref 80.0–100.0)
MPV: 10.8 fL (ref 7.5–12.5)
Monocytes Relative: 5.7 %
Neutro Abs: 5662 cells/uL (ref 1500–7800)
Neutrophils Relative %: 67.4 %
Platelets: 323 10*3/uL (ref 140–400)
RBC: 4.25 10*6/uL (ref 3.80–5.10)
RDW: 12.1 % (ref 11.0–15.0)
Total Lymphocyte: 24.8 %
WBC: 8.4 10*3/uL (ref 3.8–10.8)

## 2020-04-06 LAB — ANA, IFA COMPREHENSIVE PANEL
Anti Nuclear Antibody (ANA): NEGATIVE
ENA SM Ab Ser-aCnc: <1 AI
SM/RNP: <1 AI
SSA (Ro) (ENA) Antibody, IgG: <1 AI
SSB (La) (ENA) Antibody, IgG: <1 AI
Scleroderma (Scl-70) (ENA) Antibody, IgG: <1 AI
ds DNA Ab: 39 IU/mL — ABNORMAL HIGH

## 2020-04-06 LAB — SEDIMENTATION RATE: Sed Rate: 9 mm/h (ref 0–30)

## 2020-04-06 LAB — IRON,TIBC AND FERRITIN PANEL
%SAT: 20 % (calc) (ref 16–45)
Ferritin: 71 ng/mL (ref 16–288)
Iron: 96 ug/dL (ref 45–160)
TIBC: 488 mcg/dL (calc) — ABNORMAL HIGH (ref 250–450)

## 2020-04-06 LAB — VITAMIN D 25 HYDROXY (VIT D DEFICIENCY, FRACTURES): Vit D, 25-Hydroxy: 32 ng/mL (ref 30–100)

## 2020-04-06 LAB — RHEUMATOID FACTOR: Rheumatoid fact SerPl-aCnc: <14 IU/mL (ref <14)

## 2020-04-06 LAB — TSH: TSH: 1.25 mIU/L (ref 0.40–4.50)

## 2020-04-06 MED ORDER — LEVOTHYROXINE SODIUM 88 MCG PO TABS: 88.0000 ug | ORAL_TABLET | Freq: Every day | ORAL | 1 refills | Status: DC

## 2020-04-06 MED ORDER — DICLOFENAC SODIUM 75 MG PO TBEC: 75.0000 mg | DELAYED_RELEASE_TABLET | Freq: Two times a day (BID) | ORAL | 1 refills | Status: DC

## 2020-04-06 NOTE — Telephone Encounter (Signed)
Please inform patient the following information: - blood cell counts are normal. - thyroid function is normal. - iron levels are normal. -vitamin D levels are in normal range.  I would encourage her to continue her daily vitamin D supplementation. -Liver function is normal. -Her kidney function is mildly decreased, but stable from prior collections over the last 2 years.  She definitely needs to drink more water daily.  She should try for at least 60 ounces of water daily.  This will help improve her kidney function.   -For her pain, I have called in diclofenac which is an anti-inflammatory taken every 12 hours (best if food on the stomach when taken).  I would encourage her to take this every day twice daily, not just for the pain but also for the anti-inflammatory effect to help with her shoulder.  Once she gets to orthopedics they may have other recommendations for her at that time depending upon the findings.   There are a couple labs remaining to look for autoimmune disorders and inflammatory arthritic disorders.  We will call her when we receive these results.

## 2020-04-06 NOTE — Telephone Encounter (Signed)
FYI, she wanted to let you know she has her appointment with ortho next Wednesday, 2/16. She was advised of labs results/recommendations.

## 2020-04-07 ENCOUNTER — Telehealth: Payer: Self-pay | Admitting: Family Medicine

## 2020-04-07 DIAGNOSIS — M2550 Pain in unspecified joint: Secondary | ICD-10-CM

## 2020-04-07 DIAGNOSIS — R768 Other specified abnormal immunological findings in serum: Secondary | ICD-10-CM

## 2020-04-07 DIAGNOSIS — N1832 Chronic kidney disease, stage 3b: Secondary | ICD-10-CM

## 2020-04-07 DIAGNOSIS — R5383 Other fatigue: Secondary | ICD-10-CM

## 2020-04-07 DIAGNOSIS — N2889 Other specified disorders of kidney and ureter: Secondary | ICD-10-CM

## 2020-04-07 NOTE — Telephone Encounter (Signed)
Spoke with pt regarding labs and instructions.   

## 2020-04-07 NOTE — Telephone Encounter (Signed)
Please inform patient one of her tests for an autoimmune disorder came back positive.  We do need to perform further evaluation to narrow down possible disorder. This could explain her fatigue, kidney dysfunction, joint pain and her elevated pressure.  Please schedule her for a lab appointment only within the next week and a provider appointment the week following.  Many of these labs are send out labs and will take quite a few days to return.  We must have an appointment to follow-up face-to-face in order to discuss all results and further plan.  Please inform patient these labs consist of blood and urine.  She should be well-hydrated prior to lab.  Thanks

## 2020-04-08 ENCOUNTER — Other Ambulatory Visit: Payer: Self-pay

## 2020-04-08 ENCOUNTER — Ambulatory Visit (INDEPENDENT_AMBULATORY_CARE_PROVIDER_SITE_OTHER): Payer: Medicare Other

## 2020-04-08 DIAGNOSIS — N1832 Chronic kidney disease, stage 3b: Secondary | ICD-10-CM

## 2020-04-08 DIAGNOSIS — R5383 Other fatigue: Secondary | ICD-10-CM

## 2020-04-08 DIAGNOSIS — R768 Other specified abnormal immunological findings in serum: Secondary | ICD-10-CM

## 2020-04-08 DIAGNOSIS — N2889 Other specified disorders of kidney and ureter: Secondary | ICD-10-CM | POA: Diagnosis not present

## 2020-04-08 DIAGNOSIS — M2550 Pain in unspecified joint: Secondary | ICD-10-CM | POA: Diagnosis not present

## 2020-04-08 DIAGNOSIS — E538 Deficiency of other specified B group vitamins: Secondary | ICD-10-CM

## 2020-04-08 LAB — VITAMIN B12: Vitamin B-12: >1506 pg/mL — ABNORMAL HIGH (ref 211–911)

## 2020-04-09 LAB — URINALYSIS, ROUTINE W REFLEX MICROSCOPIC
Bilirubin Urine: NEGATIVE
Glucose, UA: NEGATIVE
Hgb urine dipstick: NEGATIVE
Leukocytes,Ua: NEGATIVE
Nitrite: NEGATIVE
Specific Gravity, Urine: 1.031 (ref 1.001–1.03)
pH: <=5 (ref 5.0–8.0)

## 2020-04-09 LAB — MICROALBUMIN / CREATININE URINE RATIO
Creatinine, Urine: 313 mg/dL — ABNORMAL HIGH (ref 20–275)
Microalb Creat Ratio: 13 mcg/mg creat (ref <30)
Microalb, Ur: 4.2 mg/dL

## 2020-04-09 LAB — LUPUS ANTICOAGULANT PANEL
Dilute Viper Venom Time: 35.1 s (ref 0.0–47.0)
PTT Lupus Anticoagulant: 40.4 s (ref 0.0–51.9)

## 2020-04-12 LAB — ANTIPHOSPHOLOPID AB PANEL
Anticardiolipin IgA: <2 APL-U/mL (ref <20.0)
Anticardiolipin IgG: <2 GPL-U/mL (ref <20.0)
Anticardiolipin IgM: <2 MPL-U/mL (ref <20.0)
Beta-2 Glyco 1 IgA: <2 U/mL (ref <20.0)
Beta-2 Glyco 1 IgM: <2 U/mL (ref <20.0)
Beta-2 Glyco I IgG: <2 U/mL (ref <20.0)
PHOSPHATIDYLSERINE AB  (IGG): <10 U/mL (ref <10)
PHOSPHATIDYLSERINE AB (IGA): <20 U/mL (ref <20)

## 2020-04-12 LAB — PROTEIN,TOTAL AND ELECTROPHOR W/IFE
Albumin ELP: 4.4 g/dL (ref 3.8–4.8)
Alpha 1: 0.3 g/dL (ref 0.2–0.3)
Alpha 2: 0.7 g/dL (ref 0.5–0.9)
Beta 2: 0.4 g/dL (ref 0.2–0.5)
Beta Globulin: 0.6 g/dL (ref 0.4–0.6)
Gamma Globulin: 1 g/dL (ref 0.8–1.7)
Immunofix Electr Int: NOT DETECTED
Total Protein: 7.3 g/dL (ref 6.1–8.1)

## 2020-04-12 LAB — C3 AND C4
C3 Complement: 172 mg/dL (ref 83–193)
C4 Complement: 26 mg/dL (ref 15–57)

## 2020-04-13 ENCOUNTER — Ambulatory Visit (INDEPENDENT_AMBULATORY_CARE_PROVIDER_SITE_OTHER): Payer: Medicare Other | Admitting: Orthopaedic Surgery

## 2020-04-13 ENCOUNTER — Ambulatory Visit (INDEPENDENT_AMBULATORY_CARE_PROVIDER_SITE_OTHER): Payer: Medicare Other

## 2020-04-13 DIAGNOSIS — G8929 Other chronic pain: Secondary | ICD-10-CM

## 2020-04-13 DIAGNOSIS — M25512 Pain in left shoulder: Secondary | ICD-10-CM

## 2020-04-13 DIAGNOSIS — M7542 Impingement syndrome of left shoulder: Secondary | ICD-10-CM | POA: Diagnosis not present

## 2020-04-13 MED ORDER — LIDOCAINE HCL 1 % IJ SOLN
3.0000 mL | INTRAMUSCULAR | Status: AC | PRN
Start: 2020-04-13 — End: 2020-04-13
  Administered 2020-04-13: 3 mL

## 2020-04-13 MED ORDER — METHYLPREDNISOLONE ACETATE 40 MG/ML IJ SUSP
40.0000 mg | INTRAMUSCULAR | Status: AC | PRN
  Administered 2020-04-13: 40 mg via INTRA_ARTICULAR

## 2020-04-13 NOTE — Progress Notes (Signed)
Office Visit Note   Patient: Cynthia Powell           Date of Birth: 1946-01-20           MRN: [id number] Visit Date: 04/13/2020              Requested by: Ma Hillock, DO [id number] [place],  [place] [address] PCP: Ma Hillock, DO   Assessment & Plan: Visit Diagnoses:  1. Chronic left shoulder pain   2. Impingement syndrome of left shoulder     Plan: I have recommended a steroid injection in her left shoulder subacromial space combined with outpatient physical therapy.  She agrees with this treatment plan.  She did tolerate the injection well.  All questions and concerns were answered and addressed.  We will see her back in 6 weeks for repeat evaluation.  Follow-Up Instructions: Return in about 6 weeks (around 05/25/2020).   Orders:  Orders Placed This Encounter  Procedures  . Large Joint Inj  . XR Shoulder Left   No orders of the defined types were placed in this encounter.     Procedures: Large Joint Inj: L subacromial bursa on 04/13/2020 2:25 PM Indications: pain and diagnostic evaluation Details: 22 G 1.5 in needle  Arthrogram: No  Medications: 3 mL lidocaine 1 %; 40 mg methylPREDNISolone acetate 40 MG/ML Outcome: tolerated well, no immediate complications Procedure, treatment alternatives, risks and benefits explained, specific risks discussed. Consent was given by the patient. Immediately prior to procedure a time out was called to verify the correct patient, procedure, equipment, support staff and site/side marked as required. Patient was prepped and draped in the usual sterile fashion.       Clinical Data: No additional findings.   Subjective: Chief Complaint  Patient presents with  . Left Shoulder - Pain  The patient is a very pleasant 75 year old female who comes in for evaluation treatment of left shoulder pain.  She says her primary care physician is worried that she may have a rotator cuff tear.  She does not remember any type of injury and  said the pain is come along slowly but is gotten to where it is definitely affecting her actives daily living with reaching overhead and behind her.  She is having worsening shoulder pain with activities on that left shoulder.  She denies any numbness and tingling in her left hand and denies any neck pain.  She is not a diabetic.  She had a similar issue with her right shoulder many years ago and several injections helped that pain go away and now she is asymptomatic on the right shoulder.  HPI  Review of Systems There is currently listed no headache, chest pain, shortness of breath, fever, chills, nausea, vomiting  Objective: Vital Signs: There were no vitals taken for this visit.  Physical Exam She is alert and oriented x3 and in no acute distress Ortho Exam Examination of the left shoulder shows positive Neer and Hawkins signs with related abduction.  Her internal rotation with adduction is painful but close to full.  Her external rotation is also limited and painful.  The rotator cuff itself seems to be weak.  She does use more of a deltoid to abduct her shoulder. Specialty Comments:  No specialty comments available.  Imaging: XR Shoulder Left  Result Date: 04/13/2020 3 views of the left shoulder show no acute findings.  The shoulder is well located.  The humeral head is not high riding.  PMFS History: Patient Active Problem List   Diagnosis Date Noted  . Adjustment disorder 04/05/2020  . Allergic rhinitis 04/05/2020  . Encounter for issue of repeat prescription 04/05/2020  . Enthesopathy of hip region 04/05/2020  . Herpes zoster 04/05/2020  . Sciatica 04/05/2020  . Acute pain of left shoulder 04/05/2020  . Polyarthralgia 04/05/2020  . B12 deficiency 04/05/2020  . Myalgia 04/05/2020  . Other fatigue 04/05/2020  . CKD (chronic kidney disease) stage 3, GFR 30-59 ml/min (HCC) 11/11/2018  . Statin declined 05/01/2018  . Vitamin D deficiency 10/29/2017  . Sensory hearing  loss, 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  . Spider veins of both lower extremities 09/12/2015  . Difficulty sleeping 09/12/2015  . Toenail deformity 08/31/2015  . Uses hearing aid 03/08/2015  . Osteoporosis 03/08/2015  . Hypothyroidism 03/02/2015  . Essential hypertension 03/02/2015  . Hyperlipidemia 03/02/2015  . Family dynamics problem 03/02/2015  . Glaucoma 06/17/2013  . Hearing loss 06/17/2013  . Other allergy, other than to medicinal agents 06/17/2013  . Personal history of other genital system and obstetric disorders(V13.29) 01/01/2012  . Arthropathy 07/04/2011  . 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 Abnormal Pap smear of vagina 1995,2007   pt had h/o abnl pap; does not desire future screening/PAP  . Allergy    seasonal and food  . Arthritis    osteoarthritis  . Benign neoplasm of colon   . 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 Glaucoma   . History of chickenpox   . History of shingles    above her right knee, gets frequently.  Marland Kitchen Hyperlipidemia    using Krill oil; refuses statin  . Hypertension   . Osteoporosis   . Sensory hearing loss, bilateral 2017   Bilateral hearing aids. [facility]  . Thyroid disease 1980  . Trigger finger   . Wears hearing aid in both ears     Family History  Problem Relation Age of Onset  . Arthritis Mother   . Diabetes Mother   . Stroke Mother   . Hypertension Mother   . Cancer Father   . Heart disease Father   . Hypertension Father   . Pernicious anemia Father   . Breast cancer Sister        breast cancer  . Melanoma Daughter        melanoma 2009  . Diabetes Daughter   . Mental retardation Daughter   . Hearing loss Maternal Grandfather   . Depression Daughter        second daughter  . Lymphoma Daughter        Brain mets- on chemo ([facility])  . OCD Daughter     Past Surgical History:  Procedure Laterality Date  . BREAST SURGERY  1990   biopsy  . CESAREAN SECTION    .  COLPOSCOPY    . TONSILLECTOMY  1954   Social History   Occupational History  . Not on file  Tobacco Use  . Smoking status: Passive Smoke Exposure - Never Smoker  . Smokeless tobacco: Never Used  Vaping Use  . Vaping Use: Never used  Substance and Sexual Activity  . Alcohol use: No  . Drug use: No  . Sexual activity: Never

## 2020-04-14 ENCOUNTER — Telehealth: Payer: Self-pay | Admitting: Family Medicine

## 2020-04-14 NOTE — Telephone Encounter (Signed)
Patient states one her blood work results from our office showed a high amount of bacteria. She wonders if she should be on an antibiotic for this.  Patient also would like to give the following update: She saw Dr. Rush Farmer, Orthopedist, yesterday. He gave her a steroid shot and she will be seeing him again in six weeks. He also wrote RX for PT in [place].

## 2020-04-14 NOTE — Telephone Encounter (Signed)
Spoke with pt regarding labs and instructions.   

## 2020-04-14 NOTE — Telephone Encounter (Signed)
Please advise 

## 2020-04-14 NOTE — Telephone Encounter (Signed)
Please inform patient we will review her labs at her follow up appt next week. There are no emergent results at this time.  Thanks.

## 2020-04-18 ENCOUNTER — Other Ambulatory Visit: Payer: Self-pay

## 2020-04-19 ENCOUNTER — Encounter: Payer: Self-pay | Admitting: Family Medicine

## 2020-04-19 ENCOUNTER — Ambulatory Visit (INDEPENDENT_AMBULATORY_CARE_PROVIDER_SITE_OTHER): Payer: Medicare Other | Admitting: Family Medicine

## 2020-04-19 VITALS — BP 131/76 | HR 83 | Temp 98.5°F | Ht 61.0 in | Wt 126.0 lb

## 2020-04-19 DIAGNOSIS — N1832 Chronic kidney disease, stage 3b: Secondary | ICD-10-CM

## 2020-04-19 DIAGNOSIS — M2550 Pain in unspecified joint: Secondary | ICD-10-CM

## 2020-04-19 DIAGNOSIS — R768 Other specified abnormal immunological findings in serum: Secondary | ICD-10-CM

## 2020-04-19 DIAGNOSIS — R5383 Other fatigue: Secondary | ICD-10-CM

## 2020-04-19 NOTE — Progress Notes (Signed)
This visit occurred during the SARS-CoV-2 public health emergency.  Safety protocols were in place, including screening questions prior to the visit, additional usage of staff PPE, and extensive cleaning of exam room while observing appropriate contact time as indicated for disinfecting solutions.    Cynthia Powell , [birth year], 75 y.o., female MRN: [id number] Patient Care Team    Relationship Specialty Notifications Start End  Ma Hillock, DO PCP - General Family Medicine  03/02/15   Odette Fraction  Optometry  09/30/18   [facility]  Audiology  09/30/18     Chief Complaint  Patient presents with  . Follow-up     Subjective: Pt presents for follow up on her multiple joints that are causing her discomfort, not associated with her left shoulder pain.  She is also endorsing fatigue.  She is under a lot of stress.  She had similar complaints a few years back, that seemed to self resolve. 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 She has hypertension.  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  Bmp with mildly decreasing GFR. Cbc, Iron panel, alk phos, b12, vit d, spep, lupus anticoag panel, anticardiolipin., tsh, complement levels, ANA> all normal.    Depression screen [facility] 2/9 04/05/2020 11/25/2019 09/30/2018 04/30/2018 04/29/2017  Decreased Interest 0 0 0 0 0  Down, Depressed, Hopeless 0 0 0 0 0  PHQ - 2 Score 0 0 0 0 0    Allergies  Allergen Reactions  . Iodinated Diagnostic Agents     Other reaction(s): UNCONSCIOUSNESS   Social History   Social History Narrative  . Not on file   Past Medical History:  Diagnosis Date  . Abnormal Pap smear of vagina 1995,2007   pt had h/o abnl pap; does not desire future screening/PAP  . Allergy    seasonal and food  . Arthritis    osteoarthritis  . Benign neoplasm of colon   . 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 Glaucoma   . History of chickenpox   . History of shingles    above her right knee,  gets frequently.  Marland Kitchen Hyperlipidemia    using Krill oil; refuses statin  . Hypertension   . Osteoporosis   . Sensory hearing loss, bilateral 2017   Bilateral hearing aids. [facility]  . Thyroid disease 1980  . Trigger finger   . Wears hearing aid in both ears    Past Surgical History:  Procedure Laterality Date  . BREAST SURGERY  1990   biopsy  . CESAREAN SECTION    . COLPOSCOPY    . TONSILLECTOMY  1954   Family History  Problem Relation Age of Onset  . Arthritis Mother   . Diabetes Mother   . Stroke Mother   . Hypertension Mother   . Cancer Father   . Heart disease Father   . Hypertension Father   . Pernicious anemia Father   . Breast cancer Sister        breast cancer  . Melanoma Daughter        melanoma 2009  . Diabetes Daughter   . Mental retardation Daughter   . Hearing loss Maternal Grandfather   . Depression Daughter        second daughter  . Lymphoma Daughter        Brain mets- on chemo ([facility])  . OCD Daughter    Allergies as of 04/19/2020  Reactions   Iodinated Diagnostic Agents    Other reaction(s): UNCONSCIOUSNESS      Medication List       Accurate as of April 19, 2020  2:42 PM. If you have any questions, ask your nurse or doctor.        aspirin EC 81 MG tablet Take 81 mg by mouth daily.   cholecalciferol 1000 units tablet Commonly known as: VITAMIN D Take 5,000 Units by mouth once a week.   diclofenac 75 MG EC tablet Commonly known as: VOLTAREN Take 1 tablet (75 mg total) by mouth 2 (two) times daily.   fenofibrate 145 MG tablet Commonly known as: Tricor Take 1 tablet (145 mg total) by mouth daily.   Fish Oil 1000 MG Caps Take by mouth.   levothyroxine 88 MCG tablet Commonly known as: Synthroid Take 1 tablet (88 mcg total) by mouth daily before breakfast.   lisinopril 30 MG tablet Commonly known as: ZESTRIL TAKE 1 TABLET DAILY   Lumigan 0.01 % Soln Generic drug: bimatoprost   multivitamin-iron-minerals-folic acid  chewable tablet Frequency:   Dosage:0.0     Instructions:  Note:   VITAMIN B 12 PO Take by mouth.       All past medical history, surgical history, allergies, family history, immunizations andmedications were updated in the EMR today and reviewed under the history and medication portions of their EMR.     ROS: Negative, with the exception of above mentioned in HPI   Objective:  BP 131/76   Pulse 83   Temp 98.5 F (36.9 C) (Oral)   Ht _0  (1.549 m)   Wt 126 lb (57.2 kg)   SpO2 98%   BMI 23.81 kg/m  Body mass index is 23.81 kg/m. Gen: Afebrile. No acute distress.  HENT: AT. [place]. No cough or hoarseness.  Eyes:Pupils Equal Round Reactive to light, Extraocular movements intact,  Conjunctiva without redness, discharge or icterus. Skin: no rashes, purpura or petechiae.  Neuro: Normal gait. PERLA. EOMi. Alert. Oriented x3 Psych: Normal affect, dress and demeanor. Normal speech. Normal thought content and judgment..   No exam data present No results found. No results found for this or any previous visit (from the past 24 hour(s)).  Assessment/Plan: Cynthia Powell is a 75 y.o. female present for OV for  fatigue/Myalgia/B12 deficiency/polyarthralgia/Vitamin D deficiency/positive DS DNA:  - rheumatology referral placed for her.  - we briefly discussed autoimmune d/o and lupus that have a + DS DNA.     Reviewed expectations re: course of current medical issues.  Discussed self-management of symptoms.  Outlined signs and symptoms indicating need for more acute intervention.  Patient verbalized understanding and all questions were answered.  Patient received an After-Visit Summary.    No orders of the defined types were placed in this encounter.  No orders of the defined types were placed in this encounter.  Referral Orders  No referral(s) requested today     Note is dictated utilizing voice recognition software. Although note has been proof read prior to signing,  occasional typographical errors still can be missed. If any questions arise, please do not hesitate to call for verification.   electronically signed by:  Howard Pouch, DO  [place]

## 2020-04-19 NOTE — Patient Instructions (Addendum)
Your positive test and symptoms suggest an autoimmune disorder such as lupus. It may not be lupus.It s just one of the possibilities with a positive DS DNA test.   Rheumatology will evaluate further for you.   Systemic Lupus Erythematosus, Adult Systemic lupus erythematosus (SLE) is a long-term (chronic) disease that can affect many parts of the body. SLE is an autoimmune disease. With this type of disease, the body's defense system (immune system) mistakenly attacks healthy tissues. This can cause damage to the skin, joints, blood vessels, brain, kidneys, lungs, heart, and other internal organs. It causes pain, irritation, and inflammation. What are the causes? The cause of this condition is not known. What increases the risk? The following factors may make you more likely to develop this condition:  Being female.  Being of Asian, Hispanic, or African-American descent.  Having a family history of the condition.  Being exposed to tobacco smoke or smoking cigarettes.  Having an infection with a virus, such as Epstein-Barr virus.  Having a history of exposure to silica dust, metals, chemicals, mold or mildew, or insecticides.  Using oral contraceptives or hormone replacement therapy. What are the signs or symptoms? This condition can affect almost any organ or system in the body. Symptoms of the condition depend on which organ or system is affected. The most common symptoms include:  Fever.  Fatigue.  Weight loss.  Muscle aches.  Joint pain.  Skin rashes, especially over the nose and cheeks (butterfly rash) and after sun exposure. Symptoms can come and go. A period of time when symptoms get worse or come back is called a flare. A period of time with no symptoms is called a remission. How is this diagnosed? This condition is diagnosed based on:  Your symptoms.  Your medical history.  A physical exam. You may also have tests, including:  Blood tests.  Urine tests.  A  chest X-ray. You may be referred to an autoimmune disease specialist (rheumatologist). How is this treated? There is no cure for this condition, but treatment can help to control symptoms, prevent flares (keep symptoms in remission), and prevent damage to the heart, lungs, kidneys, and other organs. Treatment will depend on what symptoms you are having and what organs or systems are affected. Treatment may involve taking a combination of medicines over time. Common medicines used to treat this condition include:  Antimalarial medicines to control symptoms, prevent flares, and protect against organ damage.  Corticosteroids and NSAIDs to reduce inflammation.  Medicines to weaken your immune system (immunosuppressants).  Biologic response modifiers to reduce inflammation and damage. Follow these instructions at home: Eating and drinking  Eat a heart-healthy diet. This may include: ? Eating high-fiber foods, such as fresh fruits and vegetables, whole grains, and beans. ? Eating heart-healthy fats (omega-3 fats), such as fish, flaxseed, and flaxseed oil. ? Limiting foods that are high in saturated fat and cholesterol, such as processed and fried foods, fatty meat, and full-fat dairy. ? Limiting how much salt (sodium) you eat.  Include calcium and vitamin D in your diet. Good sources of calcium and vitamin D include: ? Low-fat dairy products such as milk, yogurt, and cheese. ? Certain fish, such as fresh or canned salmon, tuna, and sardines. ? Products that have calcium and vitamin D added to them (fortified products), such as fortified cereals or juice. Medicines  Take over-the-counter and prescription medicines only as told by your health care provider.  Do not take any medicines that contain estrogen without first  checking with your health care provider. Estrogen can trigger flares and may increase your risk for blood clots. Lifestyle  Stay active, as directed by your health care  provider.  Do not use any products that contain nicotine or tobacco, such as cigarettes and e-cigarettes. If you need help quitting, ask your health care provider.  Protect your skin from the sun by applying sunblock and wearing protective hats and clothing.  Learn as much as you can about your condition and have a good support system in place. Support may come from family, friends, or a lupus support group.      General instructions  Work closely with all of your health care providers to manage your condition.  Stay up to date on all vaccines as directed by your health care provider.  Keep all follow-up visits as told by your health care provider. This is important. Contact a health care provider if:  You have a fever.  Your symptoms flare.  You develop new symptoms.  You have bloody, foamy, or coffee-colored urine.  There are changes in your urination. For example, you urinate more often at night.  You think that you may be depressed or have anxiety.  You become pregnant or plan to become pregnant. Pregnancy in women with this condition is considered high risk. Get help right away if:  You have chest pain.  You have trouble breathing.  You have a seizure.  You suddenly get a very bad headache.  You suddenly develop facial or body weakness.  You cannot speak.  You cannot understand speech. These symptoms may represent a serious problem that is an emergency. Do not wait to see if the symptoms will go away. Get medical help right away. Call your local emergency services (911 in the [place] Do not drive yourself to the hospital. Summary  Systemic lupus erythematosus (SLE) is a long-term disease that can affect many parts of the body.  SLE is an autoimmune disease. That means your body's defense system (immune system) mistakenly attacks healthy tissues.  There is no cure for this condition, but treatment can help to control symptoms, prevent flares, and prevent damage  to your organs. Treatment may involve taking a combination of medicines over time. This information is not intended to replace advice given to you by your health care provider. Make sure you discuss any questions you have with your health care provider. Document Revised: 03/28/2017 Document Reviewed: 03/22/2017 Elsevier Patient Education  2021 [employer].

## 2020-05-02 ENCOUNTER — Other Ambulatory Visit: Payer: Self-pay | Admitting: Family Medicine

## 2020-05-02 DIAGNOSIS — E782 Mixed hyperlipidemia: Secondary | ICD-10-CM

## 2020-05-02 DIAGNOSIS — I10 Essential (primary) hypertension: Secondary | ICD-10-CM

## 2020-05-02 DIAGNOSIS — E039 Hypothyroidism, unspecified: Secondary | ICD-10-CM

## 2020-05-02 DIAGNOSIS — E559 Vitamin D deficiency, unspecified: Secondary | ICD-10-CM

## 2020-05-25 ENCOUNTER — Ambulatory Visit (INDEPENDENT_AMBULATORY_CARE_PROVIDER_SITE_OTHER): Payer: Medicare Other | Admitting: Orthopaedic Surgery

## 2020-05-25 ENCOUNTER — Encounter: Payer: Self-pay | Admitting: Orthopaedic Surgery

## 2020-05-25 DIAGNOSIS — M7542 Impingement syndrome of left shoulder: Secondary | ICD-10-CM | POA: Diagnosis not present

## 2020-05-25 DIAGNOSIS — G8929 Other chronic pain: Secondary | ICD-10-CM

## 2020-05-25 DIAGNOSIS — M25512 Pain in left shoulder: Secondary | ICD-10-CM

## 2020-05-25 NOTE — Progress Notes (Signed)
The patient is being seen in follow-up for her left shoulder.  She is 75 years old and certainly has had some time dealing with left shoulder pain and deficits in terms of weakness and pain with overhead activities.  We provide a steroid injection about 6 weeks ago and her left shoulder subacromial outlet and sent her through physical therapy.  She says she is not 100% but has made great improvements with range of motion in that shoulder and has had decreased pain.  She still has some weakness though.  Examination her left shoulder does show some weakness with external rotation and abduction but she does have much improved motion with especially reaching behind her.  That she is doing so well follow-up can be as needed.  She will continue home exercise program.  We can always repeat a steroid injection in May or June and that left shoulder if needed.  All question concerns were answered and addressed.  She agrees with this treatment plan.

## 2020-05-30 DIAGNOSIS — M25512 Pain in left shoulder: Secondary | ICD-10-CM | POA: Diagnosis not present

## 2020-05-30 DIAGNOSIS — M7542 Impingement syndrome of left shoulder: Secondary | ICD-10-CM | POA: Diagnosis not present

## 2020-05-30 DIAGNOSIS — R293 Abnormal posture: Secondary | ICD-10-CM | POA: Diagnosis not present

## 2020-05-31 DIAGNOSIS — H401131 Primary open-angle glaucoma, bilateral, mild stage: Secondary | ICD-10-CM | POA: Diagnosis not present

## 2020-06-01 NOTE — Progress Notes (Signed)
Office Visit Note  Patient: Cynthia Powell             Date of Birth: 1946/02/08           MRN: [id number]             PCP: Ma Hillock, DO Referring: Ma Hillock, DO Visit Date: 06/15/2020 Occupation: _0 @  Subjective:  Pain in joints.   History of Present Illness: Cynthia Powell is a 75 y.o. female seen in consultation per request of her PCP.  According to the patient she has had aches and pains all her life.  She states in late February she started experiencing increased fatigue and left shoulder pain.  She was evaluated by Dr. Ninfa Linden and was diagnosed with impingement syndrome.  She had a cortisone injection and had been going to physical therapy which has helped.  She states she dislocated her right first MCP joint several years ago which is stayed swollen.  She has some stiffness in her both hands and she notices increased pain with the barometric pressure change.  She also has pain in her right hip off-and-on.  None of the other joints are painful or swollen.  She states 3 months ago she started experiencing increased fatigue and the fatigue has gradually improved.  She denies any family history of autoimmune disease.  She is gravida 3, para 2, miscarriage 1.  There is no history of oral ulcers, nasal ulcers, malar rash, photosensitivity, Raynaud's phenomenon, lymphadenopathy, dry mouth.  She gives history of dry eyes.  Activities of Daily Living:  Patient reports morning stiffness for 0 minutes.   Patient Denies nocturnal pain.  Difficulty dressing/grooming: Reports Difficulty climbing stairs: Denies Difficulty getting out of chair: Denies Difficulty using hands for taps, buttons, cutlery, and/or writing: Reports  Review of Systems  Constitutional: Positive for fatigue. Negative for night sweats, weight gain and weight loss.  HENT: Positive for hearing loss. Negative for mouth sores, trouble swallowing, trouble swallowing, mouth dryness and nose dryness.   Eyes:  Positive for dryness. Negative for pain, redness, itching and visual disturbance.  Respiratory: Negative for cough, shortness of breath and difficulty breathing.   Cardiovascular: Negative for chest pain, palpitations, hypertension, irregular heartbeat and swelling in legs/feet.  Gastrointestinal: Negative for blood in stool, constipation and diarrhea.  Endocrine: Negative for increased urination.  Genitourinary: Negative for difficulty urinating and vaginal dryness.  Musculoskeletal: Positive for arthralgias, joint pain and joint swelling. Negative for myalgias, muscle weakness, morning stiffness, muscle tenderness and myalgias.  Skin: Negative for color change, rash, hair loss, redness, skin tightness, ulcers and sensitivity to sunlight.  Allergic/Immunologic: Negative for susceptible to infections.  Neurological: Positive for dizziness and headaches. Negative for numbness, memory loss, night sweats and weakness.       Sinus  Hematological: Negative for bruising/bleeding tendency and swollen glands.  Psychiatric/Behavioral: Negative for depressed mood, confusion and sleep disturbance. The patient is not nervous/anxious.     PMFS History:  Patient Active Problem List   Diagnosis Date Noted  . Adjustment disorder 04/05/2020  . Allergic rhinitis 04/05/2020  . Encounter for issue of repeat prescription 04/05/2020  . Enthesopathy of hip region 04/05/2020  . Herpes zoster 04/05/2020  . Sciatica 04/05/2020  . Acute pain of left shoulder 04/05/2020  . Polyarthralgia 04/05/2020  . B12 deficiency 04/05/2020  . Myalgia 04/05/2020  . Other fatigue 04/05/2020  . CKD (chronic kidney disease) stage 3, GFR 30-59 ml/min (HCC) 11/11/2018  . Statin declined 05/01/2018  .  Vitamin D deficiency 10/29/2017  . 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 Spider veins of both lower extremities 09/12/2015  . Difficulty sleeping 09/12/2015  . Toenail deformity 08/31/2015  . Uses hearing aid 03/08/2015   . Osteoporosis 03/08/2015  . Hypothyroidism 03/02/2015  . Essential hypertension 03/02/2015  . Hyperlipidemia 03/02/2015  . Family dynamics problem 03/02/2015  . Glaucoma 06/17/2013  . Hearing loss 06/17/2013  . Other allergy, other than to medicinal agents 06/17/2013  . Personal history of other genital system and obstetric disorders(V13.29) 01/01/2012  . Arthropathy 07/04/2011  . 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/30/2010    Past Medical History:  Diagnosis Date  . Abnormal Pap smear of vagina 1995,2007   pt had h/o abnl pap; does not desire future screening/PAP  . Allergy    seasonal and food  . Arthritis    osteoarthritis  . Benign neoplasm of colon   . 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 Glaucoma   . History of chickenpox   . History of shingles    above her right knee, gets frequently.  Marland Kitchen Hyperlipidemia    using Krill oil; refuses statin  . Hypertension   . Osteoporosis   . Sensory hearing loss, bilateral 2017   Bilateral hearing aids. [facility]  . Thyroid disease 1980  . Trigger finger   . Wears hearing aid in both ears     Family History  Problem Relation Age of Onset  . Arthritis Mother   . Diabetes Mother   . Stroke Mother   . Hypertension Mother   . Fibromyalgia Mother   . Heart disease Father 72  . Hypertension Father   . Colon cancer Father   . Pernicious anemia Father   . Breast cancer Sister        breast cancer  . Melanoma Daughter        melanoma 2009  . Diabetes Daughter   . Hearing loss Maternal Grandfather   . Lymphoma Daughter        Brain mets- on chemo ([facility])  . OCD Daughter   . Mental illness Daughter   . Anxiety disorder Daughter    Past Surgical History:  Procedure Laterality Date  . BREAST SURGERY  1990   biopsy  . CESAREAN SECTION     x2  . COLPOSCOPY    . TONSILLECTOMY  1954   Social History   Social History Narrative  . Not on file   Immunization History  Administered Date(s) Administered   . Fluad Quad(high Dose 65+) 12/01/2018  . Influenza Split 12/26/2007, 10/21/2008, 11/15/2011  . Influenza Whole 01/01/2002  . Influenza, Quadrivalent, Recombinant, Inj, Pf 11/21/2017  . Influenza-Unspecified 01/31/2004, 02/10/2005, 11/20/2013, 10/20/2014, 11/22/2016, 11/29/2019  . MMR 09/23/2012, 10/22/2012  . PFIZER(Purple Top)SARS-COV-2 Vaccination 03/21/2019, 04/18/2019, 12/25/2019  . Pneumococcal Conjugate-13 12/31/2011  . Pneumococcal Polysaccharide-23 11/05/2006, 01/28/2017  . Td 01/01/2002  . Tdap 10/02/2018  . Zoster 06/25/2013  . Zoster Recombinat (Shingrix) 10/02/2018, 12/10/2018     Objective: Vital Signs: BP (!) 159/69 (BP Location: Right Arm, Patient Position: Sitting, Cuff Size: Normal)   Pulse 76   Ht 5' 0.25" (1.53 m)   Wt 127 lb 9.6 oz (57.9 kg)   BMI 24.71 kg/m    Physical Exam Vitals and nursing note reviewed.  Constitutional:      Appearance: She is well-developed.  HENT:     Head: Normocephalic and atraumatic.  Eyes:     Conjunctiva/sclera: Conjunctivae normal.  Cardiovascular:  Rate and Rhythm: Normal rate and regular rhythm.     Heart sounds: Normal heart sounds.  Pulmonary:     Effort: Pulmonary effort is normal.     Breath sounds: Normal breath sounds.  Abdominal:     General: Bowel sounds are normal.     Palpations: Abdomen is soft.  Musculoskeletal:     Cervical back: Normal range of motion.  Lymphadenopathy:     Cervical: No cervical adenopathy.  Skin:    General: Skin is warm and dry.     Capillary Refill: Capillary refill takes less than 2 seconds.  Neurological:     Mental Status: She is alert and oriented to person, place, and time.  Psychiatric:        Behavior: Behavior normal.      Musculoskeletal Exam: She has some limitation with range of motion of her cervical spine.  She had discomfort range of motion of her left shoulder joint with abduction about 160 degrees.  Right shoulder joint was in full range of motion.  Elbow  joints with good range of motion.  She has bilateral CMC PIP and DIP thickening.  She had right first MCP thickening but no synovitis was noted. She describes some tenderness over right trochanteric bursa.  She has some limitation range of motion of her right hip joint.  Left hip joint was in good range of motion.  Knee joints with good range of motion.  She had high arches.  There was no tenderness over MTPs.  No synovitis was noted.  CDAI Exam: CDAI Score: -- Patient Global: --; Provider Global: -- Swollen: --; Tender: -- Joint Exam 06/15/2020   No joint exam has been documented for this visit   There is currently no information documented on the homunculus. Go to the Rheumatology activity and complete the homunculus joint exam.  Investigation: No additional findings.  Imaging: XR Hand 2 View Right  Result Date: 06/15/2020 Severe CMC narrowing and subluxation was noted.  First MCP narrowing was noted.  PIP and DIP severe narrowing was noted.  Erosive change was noted in the second DIP joint.  Generalized osteopenia was noted. 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   Recent Labs: Lab Results  Component Value Date   WBC 8.4 04/05/2020   HGB 12.5 04/05/2020   PLT 323 04/05/2020   NA 142 04/05/2020   K 4.1 04/05/2020   CL 106 04/05/2020   CO2 24 04/05/2020   GLUCOSE 114 (H) 04/05/2020   BUN 30 (H) 04/05/2020   CREATININE 1.09 (H) 04/05/2020   BILITOT 0.3 04/05/2020   ALKPHOS 43 04/30/2018   AST 24 04/05/2020   ALT 18 04/05/2020   PROT 7.3 04/08/2020   ALBUMIN 4.6 04/30/2018   CALCIUM 10.2 04/05/2020    Speciality Comments: No specialty comments available.  Procedures:  No procedures performed Allergies: Iodinated diagnostic agents   Assessment / Plan:     Visit Diagnoses: Ds DNA antibody positive - 04/05/20: ANA-, dsDNA 39, Scl-70-, SM-, SM/RNP-, Ro-, La-, ESR 9, anti-CCP-, RF-, TSH 1.25, vitamin D 32. 04/08/20: C3, C4 WNL,  anticardiolipin ab-, beta-2-, LA-.  Patient has positive double-stranded DNA but does not have any clinical features of lupus.  She denies any history of oral ulcers, nasal ulcers, malar rash, photosensitivity, Raynaud's phenomenon or lymphadenopathy.  She has mild sicca symptoms.  I will obtain AVISE labs to complete the work-up.  There is no family history of autoimmune disease.  Pain in both  hands -she complains of pain and stiffness in her hands.  No synovitis was noted.  Clinical findings are consistent with osteoarthritis.  Joint protection and muscle strengthening was discussed.  A handout on hand exercises was placed in the AVS.  Plan: XR Hand 2 View Right.  X-ray of bilateral hands showed severe osteoarthritis involving CMC, PIP and DIP joints.  Generalized osteopenia was noted.  Pain in right hip -she complains of right hip pain which she describes over the trochanteric area.  Although she had some limitation with range of motion of her right hip joint.  I have given her a handout on IT band stretches.  Plan: XR HIP UNILAT W OR W/O PELVIS 2-3 VIEWS RIGHT.  X-ray of the hip joint was unremarkable.  Extra-articular calcification was noted in the gluteal region bilaterally.  Most likely granulomas from previous trauma.  I reviewed the x-rays with the radiologist,Dr. Nelia Shi.  Impingement syndrome of left shoulder-she was evaluated by Dr. Ninfa Linden and was diagnosed with impingement syndrome.  She states her symptoms have improved a lot after the cortisone injection and physical therapy.  Other fatigue-she has been experiencing increased fatigue for the last few months which has been gradually improving per patient.  Stage 3b chronic kidney disease (HCC)-her creatinine in February was 1.09.  Essential hypertension-her systolic blood pressure is elevated today.  Have advised her to monitor blood pressure closely.  History of hyperlipidemia  History of glaucoma  Lichenification and lichen simplex  chronicus  Spider veins of both lower extremities  Vitamin D deficiency  History of hypothyroidism  Sensory hearing loss, bilateral  B12 deficiency  Orders: Orders Placed This Encounter  Procedures  . XR Hand 2 View Right  . XR HIP UNILAT W OR W/O PELVIS 2-3 VIEWS RIGHT   No orders of the defined types were placed in this encounter.    Follow-Up Instructions: Return for Osteoarthritis, +ds.   Bo Merino, MD  Note - This record has been created using Editor, commissioning.  Chart creation errors have been sought, but may not always  have been located. Such creation errors do not reflect on  the standard of medical care.

## 2020-06-03 DIAGNOSIS — M7542 Impingement syndrome of left shoulder: Secondary | ICD-10-CM | POA: Diagnosis not present

## 2020-06-03 DIAGNOSIS — R293 Abnormal posture: Secondary | ICD-10-CM | POA: Diagnosis not present

## 2020-06-03 DIAGNOSIS — M25512 Pain in left shoulder: Secondary | ICD-10-CM | POA: Diagnosis not present

## 2020-06-07 DIAGNOSIS — M25512 Pain in left shoulder: Secondary | ICD-10-CM | POA: Diagnosis not present

## 2020-06-07 DIAGNOSIS — M7542 Impingement syndrome of left shoulder: Secondary | ICD-10-CM | POA: Diagnosis not present

## 2020-06-07 DIAGNOSIS — R293 Abnormal posture: Secondary | ICD-10-CM | POA: Diagnosis not present

## 2020-06-10 DIAGNOSIS — M25512 Pain in left shoulder: Secondary | ICD-10-CM | POA: Diagnosis not present

## 2020-06-10 DIAGNOSIS — M7542 Impingement syndrome of left shoulder: Secondary | ICD-10-CM | POA: Diagnosis not present

## 2020-06-10 DIAGNOSIS — R293 Abnormal posture: Secondary | ICD-10-CM | POA: Diagnosis not present

## 2020-06-14 DIAGNOSIS — M7542 Impingement syndrome of left shoulder: Secondary | ICD-10-CM | POA: Diagnosis not present

## 2020-06-14 DIAGNOSIS — M25512 Pain in left shoulder: Secondary | ICD-10-CM | POA: Diagnosis not present

## 2020-06-14 DIAGNOSIS — R293 Abnormal posture: Secondary | ICD-10-CM | POA: Diagnosis not present

## 2020-06-15 ENCOUNTER — Encounter: Payer: Self-pay | Admitting: Rheumatology

## 2020-06-15 ENCOUNTER — Ambulatory Visit (INDEPENDENT_AMBULATORY_CARE_PROVIDER_SITE_OTHER): Payer: Medicare Other | Admitting: Rheumatology

## 2020-06-15 ENCOUNTER — Other Ambulatory Visit: Payer: Self-pay

## 2020-06-15 ENCOUNTER — Ambulatory Visit: Payer: Self-pay

## 2020-06-15 VITALS — BP 159/69 | HR 76 | Ht 60.25 in | Wt 127.6 lb

## 2020-06-15 DIAGNOSIS — Z8669 Personal history of other diseases of the nervous system and sense organs: Secondary | ICD-10-CM | POA: Diagnosis not present

## 2020-06-15 DIAGNOSIS — H903 Sensorineural hearing loss, bilateral: Secondary | ICD-10-CM

## 2020-06-15 DIAGNOSIS — I10 Essential (primary) hypertension: Secondary | ICD-10-CM

## 2020-06-15 DIAGNOSIS — R5383 Other fatigue: Secondary | ICD-10-CM | POA: Diagnosis not present

## 2020-06-15 DIAGNOSIS — M7542 Impingement syndrome of left shoulder: Secondary | ICD-10-CM

## 2020-06-15 DIAGNOSIS — M79642 Pain in left hand: Secondary | ICD-10-CM

## 2020-06-15 DIAGNOSIS — N1832 Chronic kidney disease, stage 3b: Secondary | ICD-10-CM

## 2020-06-15 DIAGNOSIS — Z8639 Personal history of other endocrine, nutritional and metabolic disease: Secondary | ICD-10-CM | POA: Diagnosis not present

## 2020-06-15 DIAGNOSIS — R768 Other specified abnormal immunological findings in serum: Secondary | ICD-10-CM

## 2020-06-15 DIAGNOSIS — M79641 Pain in right hand: Secondary | ICD-10-CM

## 2020-06-15 DIAGNOSIS — E559 Vitamin D deficiency, unspecified: Secondary | ICD-10-CM | POA: Diagnosis not present

## 2020-06-15 DIAGNOSIS — I8393 Asymptomatic varicose veins of bilateral lower extremities: Secondary | ICD-10-CM | POA: Diagnosis not present

## 2020-06-15 DIAGNOSIS — M2550 Pain in unspecified joint: Secondary | ICD-10-CM

## 2020-06-15 DIAGNOSIS — M25551 Pain in right hip: Secondary | ICD-10-CM | POA: Diagnosis not present

## 2020-06-15 DIAGNOSIS — L280 Lichen simplex chronicus: Secondary | ICD-10-CM | POA: Diagnosis not present

## 2020-06-15 DIAGNOSIS — E538 Deficiency of other specified B group vitamins: Secondary | ICD-10-CM

## 2020-06-15 NOTE — Patient Instructions (Signed)
Iliotibial Band Syndrome Rehab Ask your health care provider which exercises are safe for you. Do exercises exactly as told by your health care provider and adjust them as directed. It is normal to feel mild stretching, pulling, tightness, or discomfort as you do these exercises. Stop right away if you feel sudden pain or your pain gets significantly worse. Do not begin these exercises until told by your health care provider. Stretching and range-of-motion exercises These exercises warm up your muscles and joints and improve the movement and flexibility of your hip and pelvis. Quadriceps stretch, prone 1. Lie on your abdomen (prone position) on a firm surface, such as a bed or padded floor. 2. Bend your left / right knee and reach back to hold your ankle or pant leg. If you cannot reach your ankle or pant leg, loop a belt around your foot and grab the belt instead. 3. Gently pull your heel toward your buttocks. Your knee should not slide out to the side. You should feel a stretch in the front of your thigh and knee (quadriceps). 4. Hold this position for __________ seconds. Repeat __________ times. Complete this exercise __________ times a day.   Iliotibial band stretch An iliotibial band is a strong band of muscle tissue that runs from the outer side of your hip to the outer side of your thigh and knee. 1. Lie on your side with your left / right leg in the top position. 2. Bend both of your knees and grab your left / right ankle. Stretch out your bottom arm to help you balance. 3. Slowly bring your top knee back so your thigh goes behind your trunk. 4. Slowly lower your top leg toward the floor until you feel a gentle stretch on the outside of your left / right hip and thigh. If you do not feel a stretch and your knee will not fall farther, place the heel of your other foot on top of your knee and pull your knee down toward the floor with your foot. 5. Hold this position for __________  seconds. Repeat __________ times. Complete this exercise __________ times a day.   Strengthening exercises These exercises build strength and endurance in your hip and pelvis. Endurance is the ability to use your muscles for a long time, even after they get tired. Straight leg raises, side-lying This exercise strengthens the muscles that rotate the leg at the hip and move it away from your body (hip abductors). 1. Lie on your side with your left / right leg in the top position. Lie so your head, shoulder, hip, and knee line up. You may bend your bottom knee to help you balance. 2. Roll your hips slightly forward so your hips are stacked directly over each other and your left / right knee is facing forward. 3. Tense the muscles in your outer thigh and lift your top leg 4-6 inches (10-15 cm). 4. Hold this position for __________ seconds. 5. Slowly lower your leg to return to the starting position. Let your muscles relax completely before doing another repetition. Repeat __________ times. Complete this exercise __________ times a day.   Leg raises, prone This exercise strengthens the muscles that move the hips backward (hip extensors). 1. Lie on your abdomen (prone position) on your bed or a firm surface. You can put a pillow under your hips if that is more comfortable for your lower back. 2. Bend your left / right knee so your foot is straight up in the air.   3. Squeeze your buttocks muscles and lift your left / right thigh off the bed. Do not let your back arch. 4. Tense your thigh muscle as hard as you can without increasing any knee pain. 5. Hold this position for __________ seconds. 6. Slowly lower your leg to return to the starting position and allow it to relax completely. Repeat __________ times. Complete this exercise __________ times a day. Hip hike 1. Stand sideways on a bottom step. Stand on your left / right leg with your other foot unsupported next to the step. You can hold on to a  railing or wall for balance if needed. 2. Keep your knees straight and your torso square. Then lift your left / right hip up toward the ceiling. 3. Slowly let your left / right hip lower toward the floor, past the starting position. Your foot should get closer to the floor. Do not lean or bend your knees. Repeat __________ times. Complete this exercise __________ times a day. This information is not intended to replace advice given to you by your health care provider. Make sure you discuss any questions you have with your health care provider. Document Revised: 04/22/2019 Document Reviewed: 04/22/2019 Elsevier Patient Education  2021 [employer]. Hand Exercises Hand exercises can be helpful for almost anyone. These exercises can strengthen the hands, improve flexibility and movement, and increase blood flow to the hands. These results can make work and daily tasks easier. Hand exercises can be especially helpful for people who have joint pain from arthritis or have nerve damage from overuse (carpal tunnel syndrome). These exercises can also help people who have injured a hand. Exercises Most of these hand exercises are gentle stretching and motion exercises. It is usually safe to do them often throughout the day. Warming up your hands before exercise may help to reduce stiffness. You can do this with gentle massage or by placing your hands in warm water for 10-15 minutes. It is normal to feel some stretching, pulling, tightness, or mild discomfort as you begin new exercises. This will gradually improve. Stop an exercise right away if you feel sudden, severe pain or your pain gets worse. Ask your health care provider which exercises are best for you. Knuckle bend or "claw" fist 1. Stand or sit with your arm, hand, and all five fingers pointed straight up. Make sure to keep your wrist straight during the exercise. 2. Gently bend your fingers down toward your palm until the tips of your fingers are  touching the top of your palm. Keep your big knuckle straight and just bend the small knuckles in your fingers. 3. Hold this position for __________ seconds. 4. Straighten (extend) your fingers back to the starting position. Repeat this exercise 5-10 times with each hand. Full finger fist 1. Stand or sit with your arm, hand, and all five fingers pointed straight up. Make sure to keep your wrist straight during the exercise. 2. Gently bend your fingers into your palm until the tips of your fingers are touching the middle of your palm. 3. Hold this position for __________ seconds. 4. Extend your fingers back to the starting position, stretching every joint fully. Repeat this exercise 5-10 times with each hand. Straight fist 1. Stand or sit with your arm, hand, and all five fingers pointed straight up. Make sure to keep your wrist straight during the exercise. 2. Gently bend your fingers at the big knuckle, where your fingers meet your hand, and the middle knuckle. Keep the knuckle  at the tips of your fingers straight and try to touch the bottom of your palm. 3. Hold this position for __________ seconds. 4. Extend your fingers back to the starting position, stretching every joint fully. Repeat this exercise 5-10 times with each hand. Tabletop 1. Stand or sit with your arm, hand, and all five fingers pointed straight up. Make sure to keep your wrist straight during the exercise. 2. Gently bend your fingers at the big knuckle, where your fingers meet your hand, as far down as you can while keeping the small knuckles in your fingers straight. Think of forming a tabletop with your fingers. 3. Hold this position for __________ seconds. 4. Extend your fingers back to the starting position, stretching every joint fully. Repeat this exercise 5-10 times with each hand. Finger spread 1. Place your hand flat on a table with your palm facing down. Make sure your wrist stays straight as you do this  exercise. 2. Spread your fingers and thumb apart from each other as far as you can until you feel a gentle stretch. Hold this position for __________ seconds. 3. Bring your fingers and thumb tight together again. Hold this position for __________ seconds. Repeat this exercise 5-10 times with each hand. Making circles 1. Stand or sit with your arm, hand, and all five fingers pointed straight up. Make sure to keep your wrist straight during the exercise. 2. Make a circle by touching the tip of your thumb to the tip of your index finger. 3. Hold for __________ seconds. Then open your hand wide. 4. Repeat this motion with your thumb and each finger on your hand. Repeat this exercise 5-10 times with each hand. Thumb motion 1. Sit with your forearm resting on a table and your wrist straight. Your thumb should be facing up toward the ceiling. Keep your fingers relaxed as you move your thumb. 2. Lift your thumb up as high as you can toward the ceiling. Hold for __________ seconds. 3. Bend your thumb across your palm as far as you can, reaching the tip of your thumb for the small finger (pinkie) side of your palm. Hold for __________ seconds. Repeat this exercise 5-10 times with each hand. Grip strengthening 1. Hold a stress ball or other soft ball in the middle of your hand. 2. Slowly increase the pressure, squeezing the ball as much as you can without causing pain. Think of bringing the tips of your fingers into the middle of your palm. All of your finger joints should bend when doing this exercise. 3. Hold your squeeze for __________ seconds, then relax. Repeat this exercise 5-10 times with each hand.   Contact a health care provider if:  Your hand pain or discomfort gets much worse when you do an exercise.  Your hand pain or discomfort does not improve within 2 hours after you exercise. If you have any of these problems, stop doing these exercises right away. Do not do them again unless your  health care provider says that you can. Get help right away if:  You develop sudden, severe hand pain or swelling. If this happens, stop doing these exercises right away. Do not do them again unless your health care provider says that you can. This information is not intended to replace advice given to you by your health care provider. Make sure you discuss any questions you have with your health care provider. Document Revised: 06/05/2018 Document Reviewed: 02/13/2018 Elsevier Patient Education  2021 [employer].

## 2020-06-20 DIAGNOSIS — M2550 Pain in unspecified joint: Secondary | ICD-10-CM | POA: Diagnosis not present

## 2020-06-20 DIAGNOSIS — R5383 Other fatigue: Secondary | ICD-10-CM | POA: Diagnosis not present

## 2020-06-20 DIAGNOSIS — R768 Other specified abnormal immunological findings in serum: Secondary | ICD-10-CM | POA: Diagnosis not present

## 2020-06-20 DIAGNOSIS — D8989 Other specified disorders involving the immune mechanism, not elsewhere classified: Secondary | ICD-10-CM | POA: Diagnosis not present

## 2020-06-21 DIAGNOSIS — M25512 Pain in left shoulder: Secondary | ICD-10-CM | POA: Diagnosis not present

## 2020-06-21 DIAGNOSIS — M7542 Impingement syndrome of left shoulder: Secondary | ICD-10-CM | POA: Diagnosis not present

## 2020-06-21 DIAGNOSIS — R293 Abnormal posture: Secondary | ICD-10-CM | POA: Diagnosis not present

## 2020-06-24 DIAGNOSIS — R293 Abnormal posture: Secondary | ICD-10-CM | POA: Diagnosis not present

## 2020-06-24 DIAGNOSIS — M25512 Pain in left shoulder: Secondary | ICD-10-CM | POA: Diagnosis not present

## 2020-06-24 DIAGNOSIS — M7542 Impingement syndrome of left shoulder: Secondary | ICD-10-CM | POA: Diagnosis not present

## 2020-07-01 DIAGNOSIS — M25512 Pain in left shoulder: Secondary | ICD-10-CM | POA: Diagnosis not present

## 2020-07-01 DIAGNOSIS — M7542 Impingement syndrome of left shoulder: Secondary | ICD-10-CM | POA: Diagnosis not present

## 2020-07-01 DIAGNOSIS — R293 Abnormal posture: Secondary | ICD-10-CM | POA: Diagnosis not present

## 2020-07-02 NOTE — Progress Notes (Signed)
Office Visit Note  Patient: Cynthia Powell             Date of Birth: 24-Dec-1945           MRN: [id number]             PCP: Ma Hillock, DO Referring: Ma Hillock, DO Visit Date: 07/14/2020 Occupation: _0 @  Subjective:  Fatigue and abnormal labs.   History of Present Illness: Cynthia Powell is a 75 y.o. female returns today to follow-up on the lab results.  She states she started having fatigue few months back.  The fatigue has improved  to some extent but she still continues to have significant fatigue.  She states she has been under a lot of stress as her daughter was diagnosed with non-Hodgkin's lymphoma had went through chemotherapy.  She is also taking care of her granddaughter who lives with her now and has ADHD.  Her granddaughter also has type 1 diabetes.  [name] gets up several times at night to check her blood sugar.  She states she does not get restful sleep.  Although she is not sure if her fatigue is related to insomnia.  She denies any history of oral ulcers, nasal ulcers, malar rash, photosensitivity, Raynaud's phenomenon, inflammatory arthritis.  She continues to have some discomfort in her hands.  She states her right shoulder joint is better after injection and physical therapy.  She has an appointment coming up with the podiatrist.  Activities of Daily Living:  Patient reports morning stiffness for 0  minutes.   Patient Denies nocturnal pain.  Difficulty dressing/grooming: Reports Difficulty climbing stairs: Denies Difficulty getting out of chair: Denies Difficulty using hands for taps, buttons, cutlery, and/or writing: Denies  Review of Systems  Constitutional: Positive for fatigue. Negative for night sweats, weight gain and weight loss.  HENT: Negative for mouth sores, trouble swallowing, trouble swallowing, mouth dryness and nose dryness.   Eyes: Negative for pain, redness, itching, visual disturbance and dryness.  Respiratory: Negative for cough,  shortness of breath and difficulty breathing.   Cardiovascular: Negative for chest pain, palpitations, hypertension, irregular heartbeat and swelling in legs/feet.  Gastrointestinal: Negative for blood in stool, constipation and diarrhea.  Endocrine: Negative for increased urination.  Genitourinary: Negative for difficulty urinating and vaginal dryness.  Musculoskeletal: Positive for arthralgias, joint pain, myalgias and myalgias. Negative for joint swelling, muscle weakness, morning stiffness and muscle tenderness.  Skin: Negative for color change, rash, hair loss, redness, skin tightness, ulcers and sensitivity to sunlight.  Allergic/Immunologic: Negative for susceptible to infections.  Neurological: Positive for memory loss. Negative for dizziness, numbness, headaches, night sweats and weakness.  Hematological: Negative for bruising/bleeding tendency and swollen glands.  Psychiatric/Behavioral: Positive for sleep disturbance. Negative for depressed mood and confusion. The patient is not nervous/anxious.     PMFS History:  Patient Active Problem List   Diagnosis Date Noted  . Adjustment disorder 04/05/2020  . Allergic rhinitis 04/05/2020  . Encounter for issue of repeat prescription 04/05/2020  . Enthesopathy of hip region 04/05/2020  . Herpes zoster 04/05/2020  . Sciatica 04/05/2020  . Acute pain of left shoulder 04/05/2020  . Polyarthralgia 04/05/2020  . B12 deficiency 04/05/2020  . Myalgia 04/05/2020  . Other fatigue 04/05/2020  . CKD (chronic kidney disease) stage 3, GFR 30-59 ml/min (HCC) 11/11/2018  . Statin declined 05/01/2018  . Vitamin D deficiency 10/29/2017  . 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 Spider veins of both lower extremities 09/12/2015  .  Difficulty sleeping 09/12/2015  . Toenail deformity 08/31/2015  . Uses hearing aid 03/08/2015  . Osteoporosis 03/08/2015  . Hypothyroidism 03/02/2015  . Essential hypertension 03/02/2015  . Hyperlipidemia  03/02/2015  . Family dynamics problem 03/02/2015  . Glaucoma 06/17/2013  . Hearing loss 06/17/2013  . Other allergy, other than to medicinal agents 06/17/2013  . Personal history of other genital system and obstetric disorders(V13.29) 01/01/2012  . Arthropathy 07/04/2011  . 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/30/2010    Past Medical History:  Diagnosis Date  . Abnormal Pap smear of vagina 1995,2007   pt had h/o abnl pap; does not desire future screening/PAP  . Allergy    seasonal and food  . Arthritis    osteoarthritis  . Benign neoplasm of colon   . 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 Glaucoma   . History of chickenpox   . History of shingles    above her right knee, gets frequently.  Marland Kitchen Hyperlipidemia    using Krill oil; refuses statin  . Hypertension   . Osteoporosis   . Sensory hearing loss, bilateral 2017   Bilateral hearing aids. [facility]  . Thyroid disease 1980  . Trigger finger   . Wears hearing aid in both ears     Family History  Problem Relation Age of Onset  . Arthritis Mother   . Diabetes Mother   . Stroke Mother   . Hypertension Mother   . Fibromyalgia Mother   . Heart disease Father 53  . Hypertension Father   . Colon cancer Father   . Pernicious anemia Father   . Breast cancer Sister        breast cancer  . Melanoma Daughter        melanoma 2009  . Diabetes Daughter   . Hearing loss Maternal Grandfather   . Lymphoma Daughter        Brain mets- on chemo ([facility])  . OCD Daughter   . Mental illness Daughter   . Anxiety disorder Daughter    Past Surgical History:  Procedure Laterality Date  . BREAST SURGERY  1990   biopsy  . CESAREAN SECTION     x2  . COLPOSCOPY    . TONSILLECTOMY  1954   Social History   Social History Narrative  . Not on file   Immunization History  Administered Date(s) Administered  . Fluad Quad(high Dose 65+) 12/01/2018  . Influenza Split 12/26/2007, 10/21/2008, 11/15/2011  . Influenza Whole  01/01/2002  . Influenza, Quadrivalent, Recombinant, Inj, Pf 11/21/2017  . Influenza-Unspecified 01/31/2004, 02/10/2005, 11/20/2013, 10/20/2014, 11/22/2016, 11/29/2019  . MMR 09/23/2012, 10/22/2012  . PFIZER(Purple Top)SARS-COV-2 Vaccination 03/21/2019, 04/18/2019, 12/25/2019, 07/04/2020  . Pneumococcal Conjugate-13 12/31/2011  . Pneumococcal Polysaccharide-23 11/05/2006, 01/28/2017  . Td 01/01/2002  . Tdap 10/02/2018  . Zoster 06/25/2013  . Zoster Recombinat (Shingrix) 10/02/2018, 12/10/2018     Objective: Vital Signs: BP (!) 150/82 (BP Location: Left Arm, Patient Position: Sitting, Cuff Size: Normal)   Pulse 80   Ht 5' 0.25" (1.53 m)   Wt 128 lb 6.4 oz (58.2 kg)   HC 13" (33 cm)   BMI 24.87 kg/m    Physical Exam Vitals and nursing note reviewed.  Constitutional:      Appearance: She is well-developed.  HENT:     Head: Normocephalic and atraumatic.  Eyes:     Conjunctiva/sclera: Conjunctivae normal.  Cardiovascular:     Rate and Rhythm: Normal rate and regular rhythm.     Heart  sounds: Normal heart sounds.  Pulmonary:     Effort: Pulmonary effort is normal.     Breath sounds: Normal breath sounds.  Abdominal:     General: Bowel sounds are normal.     Palpations: Abdomen is soft.  Musculoskeletal:     Cervical back: Normal range of motion.  Lymphadenopathy:     Cervical: No cervical adenopathy.  Skin:    General: Skin is warm and dry.     Capillary Refill: Capillary refill takes less than 2 seconds.  Neurological:     Mental Status: She is alert and oriented to person, place, and time.  Psychiatric:        Behavior: Behavior normal.      Musculoskeletal Exam: She has some stiffness to range of motion of her cervical spine.  Shoulder joints with good range of motion.  Elbow joints and wrist joints with good range of motion.  She has bilateral PIP and DIP thickening with no synovitis.  Hip joints and knee joints with good range of motion.  She had no tenderness over  ankles or MTPs.  No synovitis was noted.  CDAI Exam: CDAI Score: -- Patient Global: --; Provider Global: -- Swollen: --; Tender: -- Joint Exam 07/14/2020   No joint exam has been documented for this visit   There is currently no information documented on the homunculus. Go to the Rheumatology activity and complete the homunculus joint exam.  Investigation: No additional findings.  Imaging: XR HIP UNILAT W OR W/O PELVIS 2-3 VIEWS RIGHT  Result Date: 06/15/2020 No right hip joint narrowing was noted.  No SI joint sclerosis was noted.  Bilateral extra-articular calcification was noted in the gluteal region.  Most likely granulomas from previous trauma.  Degenerative changes noted in the L5-S1. Impression: Unremarkable x-ray of the right hip joint.  XR Hand 2 View Right  Result Date: 06/15/2020 Severe CMC narrowing and subluxation was noted.  First MCP narrowing was noted.  PIP and DIP severe narrowing was noted.  Erosive change was noted in the second DIP joint.  Generalized osteopenia was noted. 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   Recent Labs: Lab Results  Component Value Date   WBC 8.4 04/05/2020   HGB 12.5 04/05/2020   PLT 323 04/05/2020   NA 142 04/05/2020   K 4.1 04/05/2020   CL 106 04/05/2020   CO2 24 04/05/2020   GLUCOSE 114 (H) 04/05/2020   BUN 30 (H) 04/05/2020   CREATININE 1.09 (H) 04/05/2020   BILITOT 0.3 04/05/2020   ALKPHOS 43 04/30/2018   AST 24 04/05/2020   ALT 18 04/05/2020   PROT 7.3 04/08/2020   ALBUMIN 4.6 04/30/2018   CALCIUM 10.2 04/05/2020   04/05/20: ANA-, dsDNA 39, Scl-70-, SM-, SM/RNP-, Ro-, La-, ESR 9, anti-CCP-, RF-, TSH 1.25, vitamin D 32. 04/08/20: C3, C4 WNL, anticardiolipin ab-, beta-2-, LA-.  June 20, 2020 AVISE lupus index -0.9, ANA 1: 80NS, dsDNA equivocal ( RNP, SSA, SSB, SCL 70, centromere, RNA polymerase 3, Jo 1 negative), CB CAP negative, anticardiolipin negative, beta-2 GP 1 negative,  antiphosphatidylserine IgM 32.37, RF negative, anti-CCP negative, antihistone negative, anti-Carr P-, antithyroglobulin negative, anti-TPO negative   Speciality Comments: No specialty comments available.  Procedures:  No procedures performed Allergies: Iodinated diagnostic agents   Assessment / Plan:     Visit Diagnoses: Ds DNA antibody positive - AVISE lupus index was -0.9.  Although her double-stranded DNA was still equivocal and antiphosphatidylserine IgM was weak positive.  She does not have any clinical features of autoimmune disease.  She denies any history of oral ulcers, nasal ulcers, malar rash, sicca symptoms, Raynaud's phenomenon, lymphadenopathy, photosensitivity or inflammatory arthritis.  I would repeat her labs in 4 months.  I have also advised her to contact me if she develops any new symptoms.  Primary osteoarthritis of both hands - History of pain and stiffness in bilateral hands.  X-ray of the right hand showed severe erosive osteoarthritis.  X-ray findings were discussed with the patient.  A handout on hand muscle strengthening exercises was given.  Joint protection muscle strengthening was discussed.  Some of the exercises were demonstrated in the office.  Pain in right hip - X-ray was unremarkable.  Clinical findings were consistent with trochanteric bursitis.  A handout on IT band stretches was given.  Impingement syndrome of left shoulder - Followed by Dr. Ninfa Linden.  She had improvement with the cortisone injection and physical therapy.  Other fatigue-she continues to have some fatigue.  She has been through rough time over the last couple of years.  Stress could be contributing to her fatigue.  Stress-her daughter was diagnosed with non-Hodgkin's lymphoma.  She has been taking care of her granddaughter who has insulin-dependent diabetes.  She also has ADHD.  Patient is stays up at night checking her blood sugar several times.  Although patient believes that she is dealing  with the stress quite well.  Other medical problems are listed as follows:  Stage 3b chronic kidney disease ([place])  Essential hypertension-her blood pressure is elevated today.  Have advised her to monitor blood pressure closely.  History of hyperlipidemia  Sensory hearing loss, bilateral  History of glaucoma  Lichenification and lichen simplex chronicus  Spider veins of both lower extremities  History of hypothyroidism  Vitamin D deficiency-she is on vitamin D supplement.  B12 deficiency  Orders: No orders of the defined types were placed in this encounter.  No orders of the defined types were placed in this encounter.    Follow-Up Instructions: Return in about 4 months (around 11/14/2020) for Osteoarthritis, +ds,+aPS, fatigue.   Bo Merino, MD  Note - This record has been created using Editor, commissioning.  Chart creation errors have been sought, but may not always  have been located. Such creation errors do not reflect on  the standard of medical care.

## 2020-07-04 DIAGNOSIS — Z23 Encounter for immunization: Secondary | ICD-10-CM | POA: Diagnosis not present

## 2020-07-08 DIAGNOSIS — M7542 Impingement syndrome of left shoulder: Secondary | ICD-10-CM | POA: Diagnosis not present

## 2020-07-08 DIAGNOSIS — M25512 Pain in left shoulder: Secondary | ICD-10-CM | POA: Diagnosis not present

## 2020-07-08 DIAGNOSIS — R293 Abnormal posture: Secondary | ICD-10-CM | POA: Diagnosis not present

## 2020-07-09 IMAGING — DX RIGHT FOOT COMPLETE - 3+ VIEW
3 series · 3 of 3 positions shown · non-contrast
Comparison: No recent.

CLINICAL DATA: Right foot injury.

EXAM:
RIGHT FOOT COMPLETE - 3+ VIEW

[foot ap]
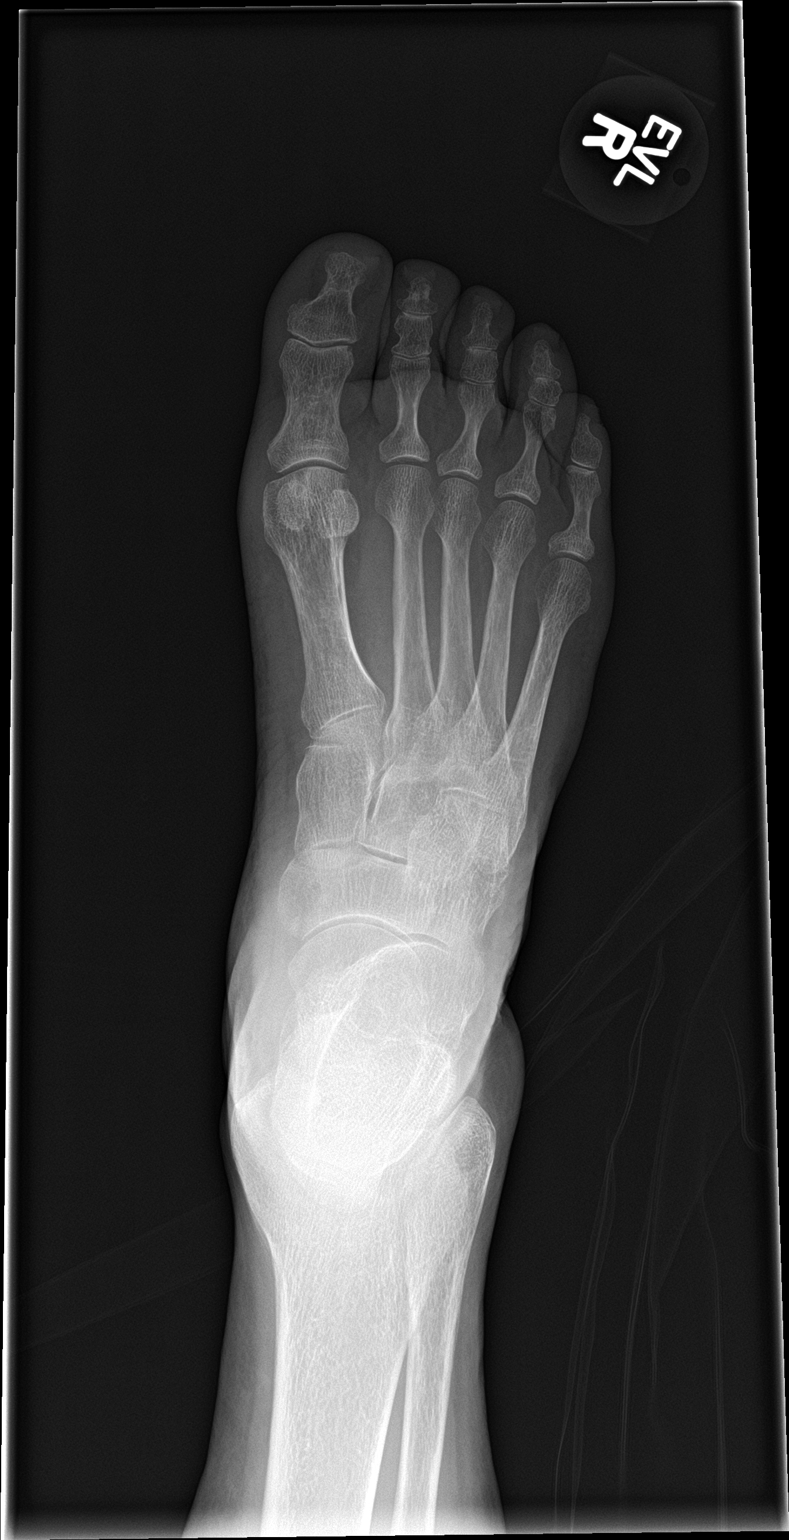

[foot obl]
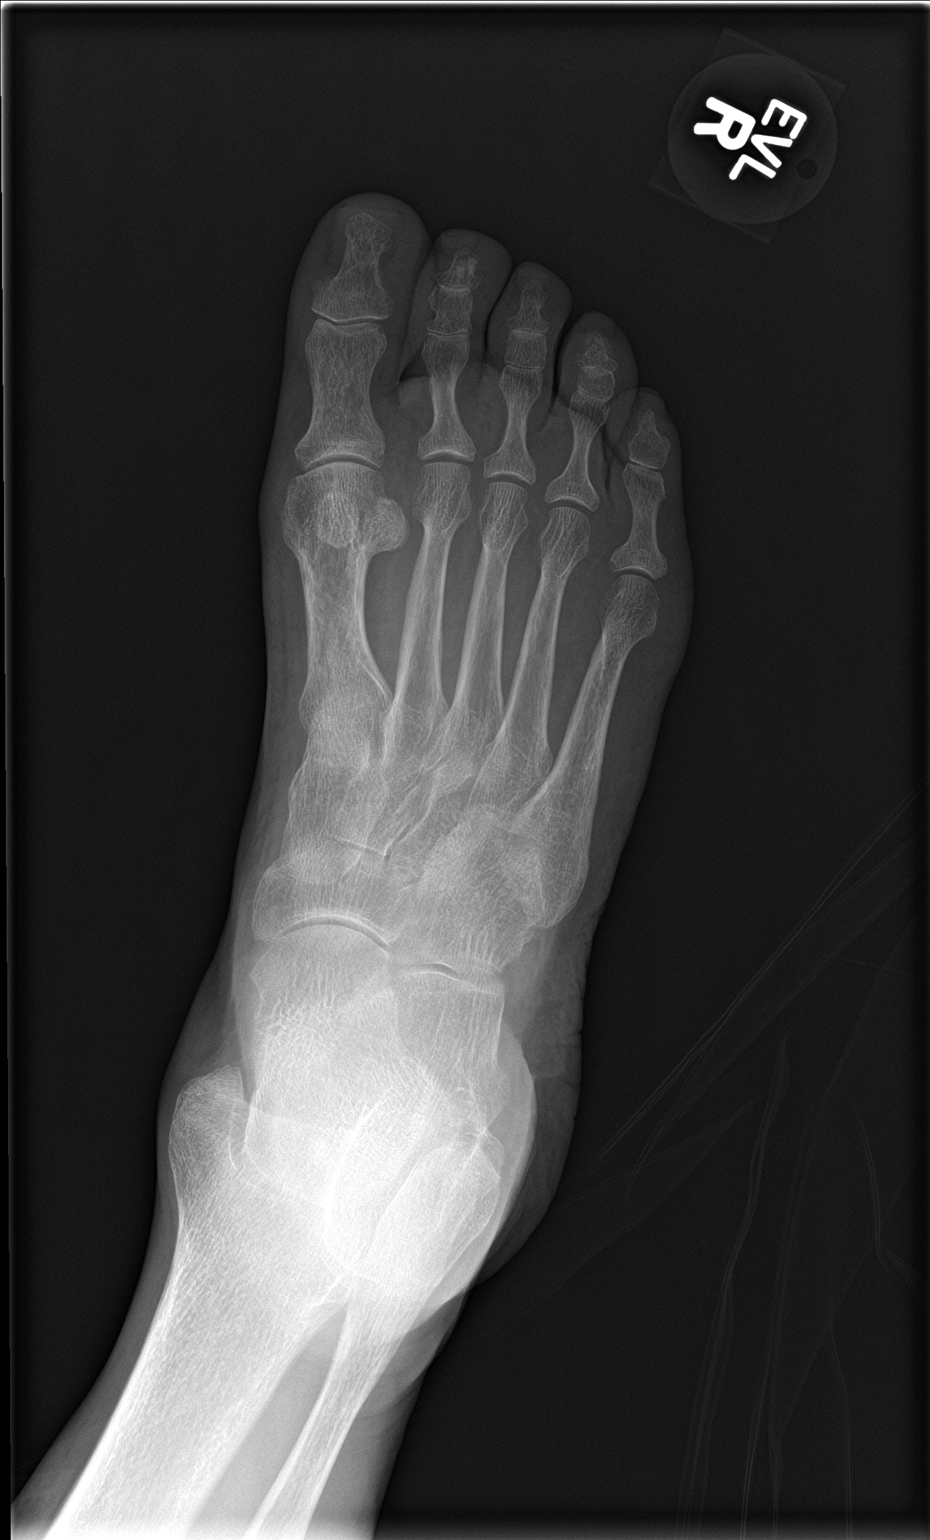

[foot lat]
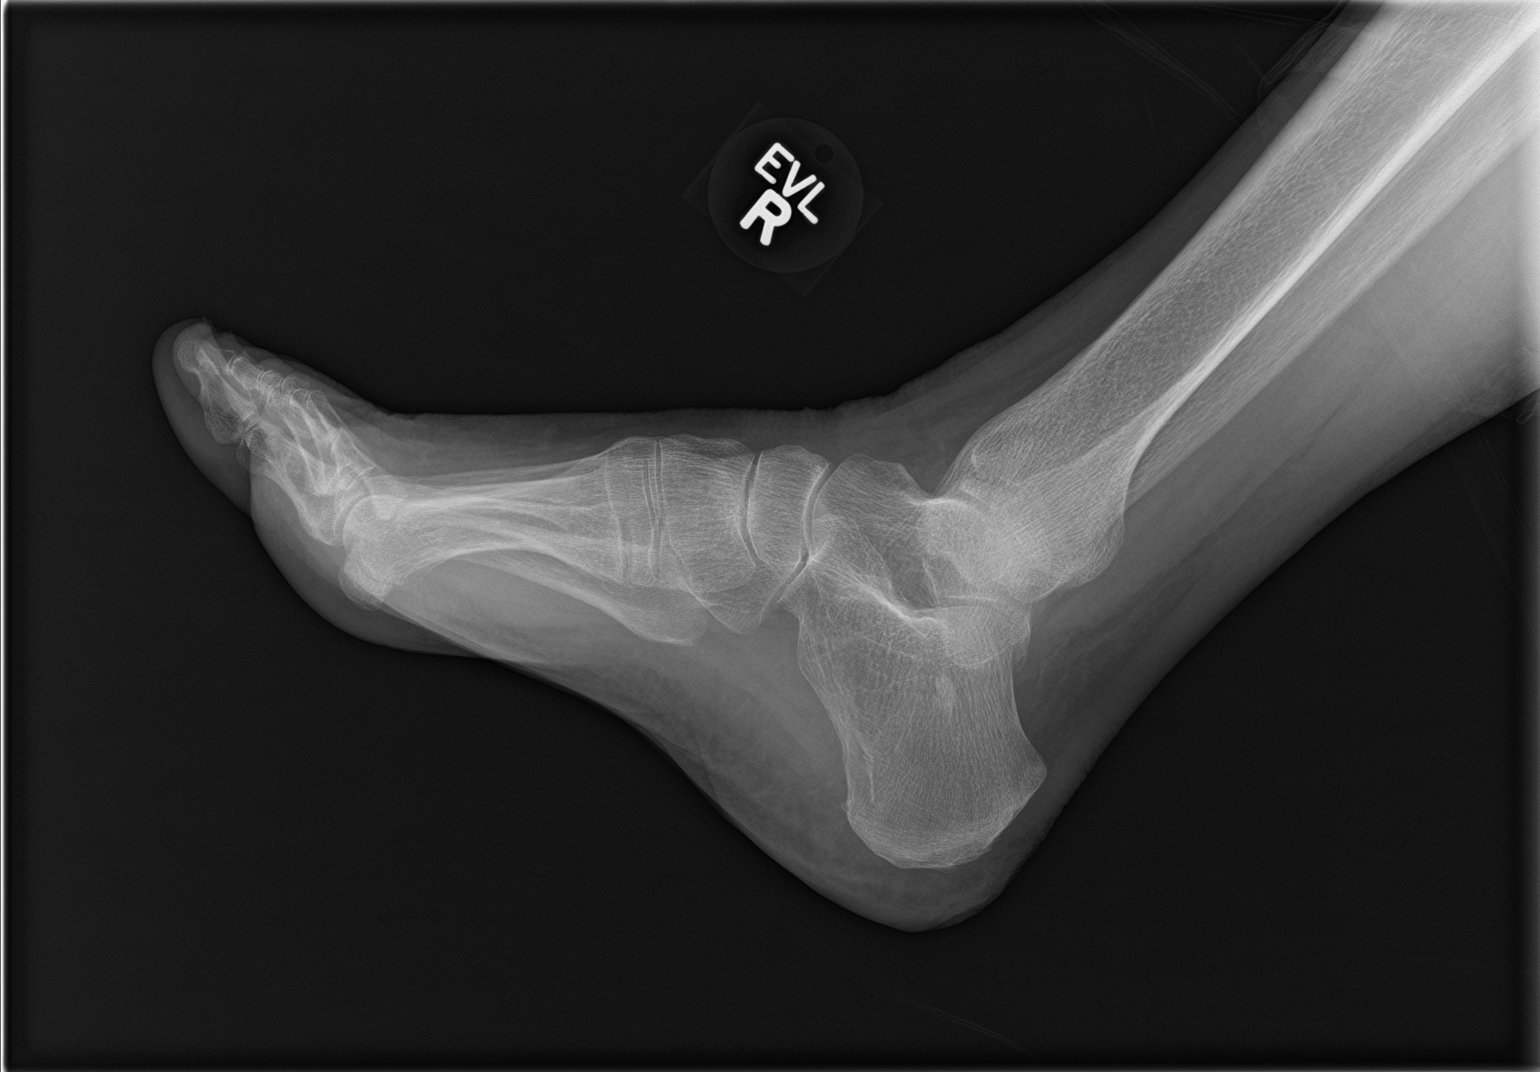

[3 of 3 positions shown; findings below may reference images not displayed]

FINDINGS: Tiny sclerotic density noted the calcaneus, most likely a bone or
tiny healed benign bone lesion. No acute bony or joint abnormality
identified. No evidence of fracture dislocation.
IMPRESSION: No acute abnormality.

## 2020-07-14 ENCOUNTER — Encounter: Payer: Self-pay | Admitting: Rheumatology

## 2020-07-14 ENCOUNTER — Ambulatory Visit (INDEPENDENT_AMBULATORY_CARE_PROVIDER_SITE_OTHER): Payer: Medicare Other | Admitting: Rheumatology

## 2020-07-14 ENCOUNTER — Other Ambulatory Visit: Payer: Self-pay

## 2020-07-14 VITALS — BP 150/82 | HR 80 | Ht 60.25 in | Wt 128.4 lb

## 2020-07-14 DIAGNOSIS — H903 Sensorineural hearing loss, bilateral: Secondary | ICD-10-CM | POA: Diagnosis not present

## 2020-07-14 DIAGNOSIS — F439 Reaction to severe stress, unspecified: Secondary | ICD-10-CM | POA: Diagnosis not present

## 2020-07-14 DIAGNOSIS — Z8669 Personal history of other diseases of the nervous system and sense organs: Secondary | ICD-10-CM

## 2020-07-14 DIAGNOSIS — M25551 Pain in right hip: Secondary | ICD-10-CM | POA: Diagnosis not present

## 2020-07-14 DIAGNOSIS — R5383 Other fatigue: Secondary | ICD-10-CM

## 2020-07-14 DIAGNOSIS — M19041 Primary osteoarthritis, right hand: Secondary | ICD-10-CM | POA: Diagnosis not present

## 2020-07-14 DIAGNOSIS — L280 Lichen simplex chronicus: Secondary | ICD-10-CM

## 2020-07-14 DIAGNOSIS — R768 Other specified abnormal immunological findings in serum: Secondary | ICD-10-CM | POA: Diagnosis not present

## 2020-07-14 DIAGNOSIS — Z8639 Personal history of other endocrine, nutritional and metabolic disease: Secondary | ICD-10-CM

## 2020-07-14 DIAGNOSIS — I8393 Asymptomatic varicose veins of bilateral lower extremities: Secondary | ICD-10-CM

## 2020-07-14 DIAGNOSIS — M7542 Impingement syndrome of left shoulder: Secondary | ICD-10-CM

## 2020-07-14 DIAGNOSIS — N1832 Chronic kidney disease, stage 3b: Secondary | ICD-10-CM

## 2020-07-14 DIAGNOSIS — I10 Essential (primary) hypertension: Secondary | ICD-10-CM | POA: Diagnosis not present

## 2020-07-14 DIAGNOSIS — E559 Vitamin D deficiency, unspecified: Secondary | ICD-10-CM

## 2020-07-14 DIAGNOSIS — E538 Deficiency of other specified B group vitamins: Secondary | ICD-10-CM

## 2020-07-14 DIAGNOSIS — M19042 Primary osteoarthritis, left hand: Secondary | ICD-10-CM

## 2020-07-14 NOTE — Patient Instructions (Addendum)
Hand Exercises Hand exercises can be helpful for almost anyone. These exercises can strengthen the hands, improve flexibility and movement, and increase blood flow to the hands. These results can make work and daily tasks easier. Hand exercises can be especially helpful for people who have joint pain from arthritis or have nerve damage from overuse (carpal tunnel syndrome). These exercises can also help people who have injured a hand. Exercises Most of these hand exercises are gentle stretching and motion exercises. It is usually safe to do them often throughout the day. Warming up your hands before exercise may help to reduce stiffness. You can do this with gentle massage or by placing your hands in warm water for 10-15 minutes. It is normal to feel some stretching, pulling, tightness, or mild discomfort as you begin new exercises. This will gradually improve. Stop an exercise right away if you feel sudden, severe pain or your pain gets worse. Ask your health care provider which exercises are best for you. Knuckle bend or "claw" fist 1. Stand or sit with your arm, hand, and all five fingers pointed straight up. Make sure to keep your wrist straight during the exercise. 2. Gently bend your fingers down toward your palm until the tips of your fingers are touching the top of your palm. Keep your big knuckle straight and just bend the small knuckles in your fingers. 3. Hold this position for __________ seconds. 4. Straighten (extend) your fingers back to the starting position. Repeat this exercise 5-10 times with each hand. Full finger fist 1. Stand or sit with your arm, hand, and all five fingers pointed straight up. Make sure to keep your wrist straight during the exercise. 2. Gently bend your fingers into your palm until the tips of your fingers are touching the middle of your palm. 3. Hold this position for __________ seconds. 4. Extend your fingers back to the starting position, stretching every  joint fully. Repeat this exercise 5-10 times with each hand. Straight fist 1. Stand or sit with your arm, hand, and all five fingers pointed straight up. Make sure to keep your wrist straight during the exercise. 2. Gently bend your fingers at the big knuckle, where your fingers meet your hand, and the middle knuckle. Keep the knuckle at the tips of your fingers straight and try to touch the bottom of your palm. 3. Hold this position for __________ seconds. 4. Extend your fingers back to the starting position, stretching every joint fully. Repeat this exercise 5-10 times with each hand. Tabletop 1. Stand or sit with your arm, hand, and all five fingers pointed straight up. Make sure to keep your wrist straight during the exercise. 2. Gently bend your fingers at the big knuckle, where your fingers meet your hand, as far down as you can while keeping the small knuckles in your fingers straight. Think of forming a tabletop with your fingers. 3. Hold this position for __________ seconds. 4. Extend your fingers back to the starting position, stretching every joint fully. Repeat this exercise 5-10 times with each hand. Finger spread 1. Place your hand flat on a table with your palm facing down. Make sure your wrist stays straight as you do this exercise. 2. Spread your fingers and thumb apart from each other as far as you can until you feel a gentle stretch. Hold this position for __________ seconds. 3. Bring your fingers and thumb tight together again. Hold this position for __________ seconds. Repeat this exercise 5-10 times with each hand.   Making circles 1. Stand or sit with your arm, hand, and all five fingers pointed straight up. Make sure to keep your wrist straight during the exercise. 2. Make a circle by touching the tip of your thumb to the tip of your index finger. 3. Hold for __________ seconds. Then open your hand wide. 4. Repeat this motion with your thumb and each finger on your  hand. Repeat this exercise 5-10 times with each hand. Thumb motion 1. Sit with your forearm resting on a table and your wrist straight. Your thumb should be facing up toward the ceiling. Keep your fingers relaxed as you move your thumb. 2. Lift your thumb up as high as you can toward the ceiling. Hold for __________ seconds. 3. Bend your thumb across your palm as far as you can, reaching the tip of your thumb for the small finger (pinkie) side of your palm. Hold for __________ seconds. Repeat this exercise 5-10 times with each hand. Grip strengthening 1. Hold a stress ball or other soft ball in the middle of your hand. 2. Slowly increase the pressure, squeezing the ball as much as you can without causing pain. Think of bringing the tips of your fingers into the middle of your palm. All of your finger joints should bend when doing this exercise. 3. Hold your squeeze for __________ seconds, then relax. Repeat this exercise 5-10 times with each hand.   Contact a health care provider if:  Your hand pain or discomfort gets much worse when you do an exercise.  Your hand pain or discomfort does not improve within 2 hours after you exercise. If you have any of these problems, stop doing these exercises right away. Do not do them again unless your health care provider says that you can. Get help right away if:  You develop sudden, severe hand pain or swelling. If this happens, stop doing these exercises right away. Do not do them again unless your health care provider says that you can. This information is not intended to replace advice given to you by your health care provider. Make sure you discuss any questions you have with your health care provider. Document Revised: 06/05/2018 Document Reviewed: 02/13/2018 Elsevier Patient Education  2021 [employer] Iliotibial Band Syndrome Rehab Ask your health care provider which exercises are safe for you. Do exercises exactly as told by your health care  provider and adjust them as directed. It is normal to feel mild stretching, pulling, tightness, or discomfort as you do these exercises. Stop right away if you feel sudden pain or your pain gets significantly worse. Do not begin these exercises until told by your health care provider. Stretching and range-of-motion exercises These exercises warm up your muscles and joints and improve the movement and flexibility of your hip and pelvis. Quadriceps stretch, prone 1. Lie on your abdomen (prone position) on a firm surface, such as a bed or padded floor. 2. Bend your left / right knee and reach back to hold your ankle or pant leg. If you cannot reach your ankle or pant leg, loop a belt around your foot and grab the belt instead. 3. Gently pull your heel toward your buttocks. Your knee should not slide out to the side. You should feel a stretch in the front of your thigh and knee (quadriceps). 4. Hold this position for __________ seconds. Repeat __________ times. Complete this exercise __________ times a day.   Iliotibial band stretch An iliotibial band is a strong band of muscle   tissue that runs from the outer side of your hip to the outer side of your thigh and knee. 1. Lie on your side with your left / right leg in the top position. 2. Bend both of your knees and grab your left / right ankle. Stretch out your bottom arm to help you balance. 3. Slowly bring your top knee back so your thigh goes behind your trunk. 4. Slowly lower your top leg toward the floor until you feel a gentle stretch on the outside of your left / right hip and thigh. If you do not feel a stretch and your knee will not fall farther, place the heel of your other foot on top of your knee and pull your knee down toward the floor with your foot. 5. Hold this position for __________ seconds. Repeat __________ times. Complete this exercise __________ times a day.   Strengthening exercises These exercises build strength and endurance in  your hip and pelvis. Endurance is the ability to use your muscles for a long time, even after they get tired. Straight leg raises, side-lying This exercise strengthens the muscles that rotate the leg at the hip and move it away from your body (hip abductors). 1. Lie on your side with your left / right leg in the top position. Lie so your head, shoulder, hip, and knee line up. You may bend your bottom knee to help you balance. 2. Roll your hips slightly forward so your hips are stacked directly over each other and your left / right knee is facing forward. 3. Tense the muscles in your outer thigh and lift your top leg 4-6 inches (10-15 cm). 4. Hold this position for __________ seconds. 5. Slowly lower your leg to return to the starting position. Let your muscles relax completely before doing another repetition. Repeat __________ times. Complete this exercise __________ times a day.   Leg raises, prone This exercise strengthens the muscles that move the hips backward (hip extensors). 1. Lie on your abdomen (prone position) on your bed or a firm surface. You can put a pillow under your hips if that is more comfortable for your lower back. 2. Bend your left / right knee so your foot is straight up in the air. 3. Squeeze your buttocks muscles and lift your left / right thigh off the bed. Do not let your back arch. 4. Tense your thigh muscle as hard as you can without increasing any knee pain. 5. Hold this position for __________ seconds. 6. Slowly lower your leg to return to the starting position and allow it to relax completely. Repeat __________ times. Complete this exercise __________ times a day. Hip hike 1. Stand sideways on a bottom step. Stand on your left / right leg with your other foot unsupported next to the step. You can hold on to a railing or wall for balance if needed. 2. Keep your knees straight and your torso square. Then lift your left / right hip up toward the ceiling. 3. Slowly let  your left / right hip lower toward the floor, past the starting position. Your foot should get closer to the floor. Do not lean or bend your knees. Repeat __________ times. Complete this exercise __________ times a day. This information is not intended to replace advice given to you by your health care provider. Make sure you discuss any questions you have with your health care provider. Document Revised: 04/22/2019 Document Reviewed: 04/22/2019 Elsevier Patient Education  2021 [employer]  

## 2020-07-15 DIAGNOSIS — R293 Abnormal posture: Secondary | ICD-10-CM | POA: Diagnosis not present

## 2020-07-15 DIAGNOSIS — M25512 Pain in left shoulder: Secondary | ICD-10-CM | POA: Diagnosis not present

## 2020-07-15 DIAGNOSIS — M7542 Impingement syndrome of left shoulder: Secondary | ICD-10-CM | POA: Diagnosis not present

## 2020-07-22 DIAGNOSIS — M7542 Impingement syndrome of left shoulder: Secondary | ICD-10-CM | POA: Diagnosis not present

## 2020-07-22 DIAGNOSIS — R293 Abnormal posture: Secondary | ICD-10-CM | POA: Diagnosis not present

## 2020-07-22 DIAGNOSIS — M25512 Pain in left shoulder: Secondary | ICD-10-CM | POA: Diagnosis not present

## 2020-08-17 ENCOUNTER — Other Ambulatory Visit: Payer: Self-pay

## 2020-08-30 ENCOUNTER — Other Ambulatory Visit: Payer: Self-pay

## 2020-08-30 DIAGNOSIS — E781 Pure hyperglyceridemia: Secondary | ICD-10-CM

## 2020-08-30 MED ORDER — FENOFIBRATE 145 MG PO TABS: 145.0000 mg | ORAL_TABLET | Freq: Every day | ORAL | 0 refills | Status: DC

## 2020-09-13 ENCOUNTER — Other Ambulatory Visit: Payer: Self-pay | Admitting: Family Medicine

## 2020-09-13 DIAGNOSIS — E559 Vitamin D deficiency, unspecified: Secondary | ICD-10-CM

## 2020-09-13 DIAGNOSIS — I10 Essential (primary) hypertension: Secondary | ICD-10-CM

## 2020-09-13 DIAGNOSIS — E782 Mixed hyperlipidemia: Secondary | ICD-10-CM

## 2020-09-13 DIAGNOSIS — E039 Hypothyroidism, unspecified: Secondary | ICD-10-CM

## 2020-09-27 ENCOUNTER — Ambulatory Visit (INDEPENDENT_AMBULATORY_CARE_PROVIDER_SITE_OTHER): Payer: Medicare Other | Admitting: Family Medicine

## 2020-09-27 ENCOUNTER — Other Ambulatory Visit: Payer: Self-pay

## 2020-09-27 ENCOUNTER — Encounter: Payer: Self-pay | Admitting: Family Medicine

## 2020-09-27 VITALS — BP 115/66 | HR 76 | Temp 98.7°F | Ht 60.0 in | Wt 117.0 lb

## 2020-09-27 DIAGNOSIS — R5383 Other fatigue: Secondary | ICD-10-CM

## 2020-09-27 DIAGNOSIS — R400 Somnolence: Secondary | ICD-10-CM | POA: Diagnosis not present

## 2020-09-27 DIAGNOSIS — R413 Other amnesia: Secondary | ICD-10-CM | POA: Insufficient documentation

## 2020-09-27 DIAGNOSIS — R0683 Snoring: Secondary | ICD-10-CM | POA: Diagnosis not present

## 2020-09-27 HISTORY — DX: Snoring: R06.83

## 2020-09-27 NOTE — Patient Instructions (Addendum)
I have referred you to neurology for sleep study and neurology evaluation for your fatigue and memory changes.   Management of Memory Problems  There are some general things you can do to help manage your memory problems.  Your memory may not in fact recover, but by using techniques and strategies you will be able to manage your memory difficulties better.  1)  Establish a routine. Try to establish and then stick to a regular routine.  By doing this, you will get used to what to expect and you will reduce the need to rely on your memory.  Also, try to do things at the same time of day, such as taking your medication or checking your calendar first thing in the morning. Think about think that you can do as a part of a regular routine and make a list.  Then enter them into a daily planner to remind you.  This will help you establish a routine.  2)  Organize your environment. Organize your environment so that it is uncluttered.  Decrease visual stimulation.  Place everyday items such as keys or cell phone in the same place every day (ie.  Basket next to front door) Use post it notes with a brief message to yourself (ie. Turn off light, lock the door) Use labels to indicate where things go (ie. Which cupboards are for food, dishes, etc.) Keep a notepad and pen by the telephone to take messages  3)  Memory Aids A diary or journal/notebook/daily planner Making a list (shopping list, chore list, to do list that needs to be done) Using an alarm as a reminder (kitchen timer or cell phone alarm) Using cell phone to store information (Notes, Calendar, Reminders) Calendar/White board placed in a prominent position Post-it notes  In order for memory aids to be useful, you need to have good habits.  It's no good remembering to make a note in your journal if you don't remember to look in it.  Try setting aside a certain time of day to look in journal.  4)  Improving mood and managing fatigue. There may  be other factors that contribute to memory difficulties.  Factors, such as anxiety, depression and tiredness can affect memory. Regular gentle exercise can help improve your mood and give you more energy. Simple relaxation techniques may help relieve symptoms of anxiety Try to get back to completing activities or hobbies you enjoyed doing in the past. Learn to pace yourself through activities to decrease fatigue. Find out about some local support groups where you can share experiences with others. Try and achieve 7-8 hours of sleep at night.

## 2020-09-27 NOTE — Progress Notes (Signed)
This visit occurred during the SARS-CoV-2 public health emergency.  Safety protocols were in place, including screening questions prior to the visit, additional usage of staff PPE, and extensive cleaning of exam room while observing appropriate contact time as indicated for disinfecting solutions.    Cynthia Powell , 07/11/1945, 75 y.o., female MRN: [id number] Patient Care Team    Relationship Specialty Notifications Start End  Ma Hillock, DO PCP - General Family Medicine  03/02/15   Odette Fraction  Optometry  09/30/18   [facility]  Audiology  09/30/18     Chief Complaint  Patient presents with   Fatigue    Pt c/o fatigue and memory changes worsen within the last 3-4 mos; pt is not fasting     Subjective: Cynthia Powell is a 75 y.o. female presents for chronic fatigue and memory changes which she has noticed have been worsening over the last 4 months.  Patient has been seen multiple times for this condition over the last 2 years.  She is supplementing with B12 and vitamin D.  Lab work has been unrevealing with the exception of a positive double-stranded DNA,.  She is established with rheumatology to further investigate cause, but they do not feel strongly she has a autoimmune disorder.  Her work resulted with + DS DNA (39).  Bmp with mildly decreasing GFR. Cbc, Iron panel, alk phos, b12, vit d, spep, lupus anticoag panel, anticardiolipin., tsh, complement levels, ANA> all normal.    Depression screen 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   Hyperlipidemia    using Krill oil; refuses statin   Hypertension    Osteoporosis    Sensory hearing loss, bilateral 2017   Bilateral hearing aids. [facility]   Thyroid disease 1980   Trigger finger    Wears hearing aid in both ears    Past Surgical History:  Procedure Laterality Date   BREAST SURGERY  1990   biopsy   CESAREAN SECTION     x2   COLPOSCOPY     TONSILLECTOMY  1954   Family History  Problem Relation Age of Onset   Arthritis Mother    Diabetes Mother    Stroke Mother    Hypertension Mother    Fibromyalgia Mother    Heart disease Father 67   Hypertension Father    Colon cancer Father    Pernicious anemia Father    Breast cancer Sister        breast cancer   Melanoma Daughter        melanoma 2009   Diabetes Daughter    Hearing  loss Maternal Grandfather    Lymphoma Daughter        Brain mets- on chemo ([facility])   OCD Daughter    Mental illness Daughter    Anxiety disorder Daughter    Allergies as of 09/27/2020       Reactions   Iodinated Diagnostic Agents    Other reaction(s): UNCONSCIOUSNESS        Medication List        Accurate as of September 27, 2020 11:59 PM. If you have any questions, ask your nurse or doctor.          cholecalciferol 1000 units tablet Commonly known as: VITAMIN D Take 5,000 Units by mouth once a week.   fenofibrate 145 MG tablet Commonly known as: Tricor Take 1 tablet (145 mg total) by mouth daily.   Fish Oil 1000 MG Caps Take by mouth.   levothyroxine 88 MCG tablet Commonly known as: SYNTHROID TAKE 1 TABLET DAILY BEFORE BREAKFAST   lisinopril 30 MG tablet Commonly known as: ZESTRIL TAKE 1 TABLET DAILY   Lumigan 0.01 % Soln Generic drug: bimatoprost    multivitamin-iron-minerals-folic acid chewable tablet Frequency:   Dosage:0.0     Instructions:  Note:   VITAMIN B 12 PO Take by mouth.        All past medical history, surgical history, allergies, family history, immunizations andmedications were updated in the EMR today and reviewed under the history and medication portions of their EMR.     ROS: Negative, with the exception of above mentioned in HPI   Objective:  BP 115/66   Pulse 76   Temp 98.7 F (37.1 C) (Oral)   Ht 5' (1.524 m)   Wt 117 lb (53.1 kg)   SpO2 98%   BMI 22.85 kg/m  Body mass index is 22.85 kg/m. Gen: Afebrile. No acute distress.  Pleasant female. HENT: AT. [place].  Eyes:Pupils Equal Round Reactive to light, Extraocular movements intact,  Conjunctiva without redness, discharge or icterus. Neck/lymp/endocrine: Supple, no lymphadenopathy, no thyromegaly CV: RRR no murmur, no edema, +2/4 P posterior tibialis pulses Chest: CTAB, no wheeze or crackles Skin: no rashes, purpura or petechiae.  Neuro:  Normal gait. PERLA. EOMi. Alert. Oriented x3 Psych: Normal affect, dress and demeanor. Normal speech. Normal thought content and judgment.   No results found. No results found. No results found for this or any previous visit (from the past 24 hour(s)).  Assessment/Plan: Cynthia Powell is a 75 y.o. female present for OV for  Memory changes/fatigue/daytime somnolence/snoring - rheumatology referral placed for her> est 06/2020 and they did not feel she had an AA d/o causing symptoms-but does plan to follow-up with her. -Her extensive laboratory evaluation has been rather unremarkable.  She continues her vitamin D and B12 supplements which have been well supplemented.  We discussed option of neurology referral for memory changes and possible sleep apnea eval.  She is agreeable to this approach.  Referred to [facility] for both complaints.  Reviewed expectations re: course of current medical  issues. Discussed self-management of symptoms. Outlined signs and symptoms indicating need for more acute intervention. Patient verbalized understanding and all questions were answered. Patient received an After-Visit Summary.    Orders Placed This Encounter  Procedures   Ambulatory referral to Neurology    No orders of the defined types were placed in this encounter.  Referral Orders  Ambulatory referral to Neurology     Note is dictated utilizing voice recognition software. Although note has been  proof read prior to signing, occasional typographical errors still can be missed. If any questions arise, please do not hesitate to call for verification.   electronically signed by:  Howard Pouch, DO  [place]

## 2020-10-27 DIAGNOSIS — R5383 Other fatigue: Secondary | ICD-10-CM | POA: Diagnosis not present

## 2020-10-27 DIAGNOSIS — R768 Other specified abnormal immunological findings in serum: Secondary | ICD-10-CM | POA: Diagnosis not present

## 2020-10-27 DIAGNOSIS — D8989 Other specified disorders involving the immune mechanism, not elsewhere classified: Secondary | ICD-10-CM | POA: Diagnosis not present

## 2020-10-28 ENCOUNTER — Other Ambulatory Visit: Payer: Self-pay | Admitting: Family Medicine

## 2020-10-28 DIAGNOSIS — E782 Mixed hyperlipidemia: Secondary | ICD-10-CM

## 2020-10-28 DIAGNOSIS — E039 Hypothyroidism, unspecified: Secondary | ICD-10-CM

## 2020-10-28 DIAGNOSIS — E559 Vitamin D deficiency, unspecified: Secondary | ICD-10-CM

## 2020-10-28 DIAGNOSIS — I10 Essential (primary) hypertension: Secondary | ICD-10-CM

## 2020-11-05 ENCOUNTER — Other Ambulatory Visit: Payer: Self-pay | Admitting: Family Medicine

## 2020-11-05 DIAGNOSIS — E781 Pure hyperglyceridemia: Secondary | ICD-10-CM

## 2020-11-09 DIAGNOSIS — Z23 Encounter for immunization: Secondary | ICD-10-CM | POA: Diagnosis not present

## 2020-11-09 NOTE — Progress Notes (Signed)
Office Visit Note  Patient: Cynthia Powell             Date of Birth: 04/17/1945           MRN: [id number]             PCP: Ma Hillock, DO Referring: Ma Hillock, DO Visit Date: 11/22/2020 Occupation: _0 @  Subjective:  Other (Right hand pain )   History of Present Illness: Cynthia Powell is a 75 y.o. female with history of osteoarthritis.  She states that she continues to have discomfort in her hands and right hip.  She describes over the right trochanteric bursa.  She states she went to see Dr. Ninfa Linden for left shoulder joint.  He gave a cortisone injection and sent her to physical therapy.  She had remarkable improvement in her shoulder after the physical therapy.  She has been trying to do stretching exercises at home.  She came to discuss the results of her autoimmune labs today.  She denies any history of oral ulcers, nasal ulcers, malar rash, photosensitivity, Raynaud's, inflammatory arthritis or lymphadenopathy.  Activities of Daily Living:  Patient reports morning stiffness for 0 minutes.   Patient Denies nocturnal pain.  Difficulty dressing/grooming: Denies Difficulty climbing stairs: Denies Difficulty getting out of chair: Denies Difficulty using hands for taps, buttons, cutlery, and/or writing: Reports  Review of Systems  Constitutional:  Positive for fatigue.  HENT:  Negative for mouth sores, mouth dryness and nose dryness.   Eyes:  Positive for itching. Negative for pain and dryness.  Respiratory:  Negative for shortness of breath and difficulty breathing.   Cardiovascular:  Negative for chest pain and palpitations.  Gastrointestinal:  Negative for blood in stool, constipation and diarrhea.  Endocrine: Negative for increased urination.  Genitourinary:  Positive for involuntary urination. Negative for difficulty urinating.  Musculoskeletal:  Positive for joint pain, joint pain and joint swelling. Negative for myalgias, morning stiffness, muscle tenderness  and myalgias.  Skin:  Negative for color change, rash and redness.  Allergic/Immunologic: Negative for susceptible to infections.  Neurological:  Positive for dizziness, headaches and memory loss. Negative for numbness and weakness.  Hematological:  Negative for bruising/bleeding tendency.  Psychiatric/Behavioral:  Negative for confusion.    PMFS History:  Patient Active Problem List   Diagnosis Date Noted   Memory changes 09/27/2020   Daytime somnolence 09/27/2020   Snoring 09/27/2020   Adjustment disorder 04/05/2020   Allergic rhinitis 04/05/2020   Encounter for issue of repeat prescription 04/05/2020   Enthesopathy of hip region 04/05/2020   Herpes zoster 04/05/2020   Sciatica 04/05/2020   Acute pain of left shoulder 04/05/2020   Polyarthralgia 04/05/2020   B12 deficiency 04/05/2020   Myalgia 04/05/2020   Other fatigue 04/05/2020   CKD (chronic kidney disease) stage 3, GFR 30-59 ml/min (HCC) 11/11/2018   Statin declined 05/01/2018   Vitamin D deficiency 10/29/2017   Sensory hearing loss, 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   Spider veins of both lower extremities 09/12/2015   Difficulty sleeping 09/12/2015   Toenail deformity 08/31/2015   Uses hearing aid 03/08/2015   Osteoporosis 03/08/2015   Hypothyroidism 03/02/2015   Essential hypertension 03/02/2015   Hyperlipidemia 03/02/2015   Family dynamics problem 03/02/2015   Glaucoma 06/17/2013   Hearing loss 06/17/2013   Other allergy, other than to medicinal agents 06/17/2013   Personal history of other genital system and obstetric disorders(V13.29) 01/01/2012   Arthropathy 65/68/1275   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/30/2010    Past  Medical History:  Diagnosis Date   Abnormal Pap smear of vagina [phone]   pt had h/o abnl pap; does not desire future screening/PAP   Allergy    seasonal and food   Arthritis    osteoarthritis   Benign neoplasm of colon    Cancer (HCC) 1995   cervical (cone bx)   Glaucoma     History of chickenpox    History of shingles    above her right knee, gets frequently.   Hyperlipidemia    using Krill oil; refuses statin   Hypertension    Osteoporosis    Sensory hearing loss, bilateral 2017   Bilateral hearing aids. [facility]   Thyroid disease 1980   Trigger finger    Wears hearing aid in both ears     Family History  Problem Relation Age of Onset   Arthritis Mother    Diabetes Mother    Stroke Mother    Hypertension Mother    Fibromyalgia Mother    Heart disease Father 61   Hypertension Father    Colon cancer Father    Pernicious anemia Father    Breast cancer Sister        breast cancer   Melanoma Daughter        melanoma 2009   Diabetes Daughter    Hearing loss Maternal Grandfather    Lymphoma Daughter        Brain mets- on chemo ([facility])   OCD Daughter    Mental illness Daughter    Anxiety disorder Daughter    Past Surgical History:  Procedure Laterality Date   BREAST SURGERY  1990   biopsy   CESAREAN SECTION     x2   [place]   Social History   Social History Narrative   Not on file   Immunization History  Administered Date(s) Administered   Fluad Quad(high Dose 65+) 12/01/2018   Influenza Split 12/26/2007, 10/21/2008, 11/15/2011   Influenza Whole 01/01/2002   Influenza, Quadrivalent, Recombinant, Inj, Pf 11/21/2017   Influenza-Unspecified 01/31/2004, 02/10/2005, 11/20/2013, 10/20/2014, 11/22/2016, 11/29/2019   MMR 09/23/2012, 10/22/2012   PFIZER(Purple Top)SARS-COV-2 Vaccination 03/21/2019, 04/18/2019, 12/25/2019, 07/04/2020, 11/09/2020   Pneumococcal Conjugate-13 12/31/2011   Pneumococcal Polysaccharide-23 11/05/2006, 01/28/2017   Td 01/01/2002   Tdap 10/02/2018   Zoster Recombinat (Shingrix) 10/02/2018, 12/10/2018   Zoster, Live 06/25/2013     Objective: Vital Signs: BP (!) 149/77 (BP Location: Left Arm, Patient Position: Sitting, Cuff Size: Normal)   Pulse 78   Ht 5' 0.5" (1.537 m)   Wt 115  lb 9.6 oz (52.4 kg)   BMI 22.20 kg/m    Physical Exam Vitals and nursing note reviewed.  Constitutional:      Appearance: She is well-developed.  HENT:     Head: Normocephalic and atraumatic.  Eyes:     Conjunctiva/sclera: Conjunctivae normal.  Cardiovascular:     Rate and Rhythm: Normal rate and regular rhythm.     Heart sounds: Normal heart sounds.  Pulmonary:     Effort: Pulmonary effort is normal.     Breath sounds: Normal breath sounds.  Abdominal:     General: Bowel sounds are normal.     Palpations: Abdomen is soft.  Musculoskeletal:     Cervical back: Normal range of motion.  Lymphadenopathy:     Cervical: No cervical adenopathy.  Skin:    General: Skin is warm and dry.     Capillary Refill: Capillary refill takes less than 2  seconds.  Neurological:     Mental Status: She is alert and oriented to person, place, and time.  Psychiatric:        Behavior: Behavior normal.     Musculoskeletal Exam: C-spine was good range of motion.  Shoulder joints, elbow joints, wrist joints with good range of motion.  She had bilateral CMC, PIP and DIP thickening with no synovitis.  She had good range of motion of her hip joints.  She had tenderness over right trochanteric bursa.  There was no tenderness of her knees, ankles or feet.  CDAI Exam: CDAI Score: -- Patient Global: --; Provider Global: -- Swollen: --; Tender: -- Joint Exam 11/22/2020   No joint exam has been documented for this visit   There is currently no information documented on the homunculus. Go to the Rheumatology activity and complete the homunculus joint exam.  Investigation: No additional findings.  Imaging: No results found.  Recent Labs: Lab Results  Component Value Date   WBC 8.4 04/05/2020   HGB 12.5 04/05/2020   PLT 323 04/05/2020   NA 142 04/05/2020   K 4.1 04/05/2020   CL 106 04/05/2020   CO2 24 04/05/2020   GLUCOSE 114 (H) 04/05/2020   BUN 30 (H) 04/05/2020   CREATININE 1.09 (H)  04/05/2020   BILITOT 0.3 04/05/2020   ALKPHOS 43 04/30/2018   AST 24 04/05/2020   ALT 18 04/05/2020   PROT 7.3 04/08/2020   ALBUMIN 4.6 04/30/2018   CALCIUM 10.2 04/05/2020    October 27, 2020 antidsDNA IgG negative, antiphosphatidylserine IgM negative, antiphosphatidylserine IgG negative  Speciality Comments: No specialty comments available.  Procedures:  No procedures performed Allergies: Iodinated diagnostic agents   Assessment / Plan:     Visit Diagnoses: Ds DNA antibody positive -repeat double-stranded ENA is negative and phosphatidylserine antibodies are negative.  She has no clinical features of autoimmune disease.  Primary osteoarthritis of both hands - History of pain and stiffness in bilateral hands.  X-ray of the right hand showed severe erosive osteoarthritis.  She continues to have pain and discomfort in her hands.  Joint protection muscle strengthening was discussed.  She was given a handout on exercises at the last visit which she has been doing to strengthen the muscles.  Pain in right hip - X-ray was unremarkable.  Clinical findings were consistent with trochanteric bursitis.  IT band stretches were again demonstrated in the office today.  Impingement syndrome of left shoulder - Followed by Dr. Ninfa Linden.  She had improvement with the cortisone injection and physical therapy.  She had good response to physical therapy.  She had good range of motion in her left shoulder today.  Age-related osteoporosis without current pathological fracture-she has thoracic kyphosis.  I reviewed her DEXA scan from March 22, 2017 which showed a T score of -2.8.  She does not want to take treatment for osteoporosis.  Use of calcium rich diet vitamin D was discussed.  Resistive exercises were discussed.  We will schedule repeat DEXA scan to evaluate BMD.  The medical problems are listed as follows:  Other fatigue  Essential hypertension  Stage 3b chronic kidney disease  (HCC)  Sensory hearing loss, bilateral  History of glaucoma  Stress  History of hyperlipidemia  Spider veins of both lower extremities  Vitamin D deficiency  B12 deficiency  History of hypothyroidism  Lichenification and lichen simplex chronicus  Orders: Orders Placed This Encounter  Procedures   DG Bone Density    No orders of the  defined types were placed in this encounter.   Follow-Up Instructions: Return in about 6 months (around 05/22/2021) for Osteoarthritis.   Bo Merino, MD  Note - This record has been created using Editor, commissioning.  Chart creation errors have been sought, but may not always  have been located. Such creation errors do not reflect on  the standard of medical care.

## 2020-11-13 DIAGNOSIS — Z23 Encounter for immunization: Secondary | ICD-10-CM | POA: Diagnosis not present

## 2020-11-22 ENCOUNTER — Ambulatory Visit (INDEPENDENT_AMBULATORY_CARE_PROVIDER_SITE_OTHER): Payer: Medicare Other | Admitting: Rheumatology

## 2020-11-22 ENCOUNTER — Encounter: Payer: Self-pay | Admitting: Rheumatology

## 2020-11-22 ENCOUNTER — Other Ambulatory Visit: Payer: Self-pay

## 2020-11-22 VITALS — BP 149/77 | HR 78 | Ht 60.5 in | Wt 115.6 lb

## 2020-11-22 DIAGNOSIS — M25551 Pain in right hip: Secondary | ICD-10-CM

## 2020-11-22 DIAGNOSIS — E559 Vitamin D deficiency, unspecified: Secondary | ICD-10-CM

## 2020-11-22 DIAGNOSIS — M19042 Primary osteoarthritis, left hand: Secondary | ICD-10-CM

## 2020-11-22 DIAGNOSIS — Z8639 Personal history of other endocrine, nutritional and metabolic disease: Secondary | ICD-10-CM | POA: Diagnosis not present

## 2020-11-22 DIAGNOSIS — R768 Other specified abnormal immunological findings in serum: Secondary | ICD-10-CM

## 2020-11-22 DIAGNOSIS — E538 Deficiency of other specified B group vitamins: Secondary | ICD-10-CM

## 2020-11-22 DIAGNOSIS — I8393 Asymptomatic varicose veins of bilateral lower extremities: Secondary | ICD-10-CM | POA: Diagnosis not present

## 2020-11-22 DIAGNOSIS — H903 Sensorineural hearing loss, bilateral: Secondary | ICD-10-CM

## 2020-11-22 DIAGNOSIS — M19041 Primary osteoarthritis, right hand: Secondary | ICD-10-CM | POA: Diagnosis not present

## 2020-11-22 DIAGNOSIS — R5383 Other fatigue: Secondary | ICD-10-CM | POA: Diagnosis not present

## 2020-11-22 DIAGNOSIS — I10 Essential (primary) hypertension: Secondary | ICD-10-CM | POA: Diagnosis not present

## 2020-11-22 DIAGNOSIS — L280 Lichen simplex chronicus: Secondary | ICD-10-CM

## 2020-11-22 DIAGNOSIS — F439 Reaction to severe stress, unspecified: Secondary | ICD-10-CM | POA: Diagnosis not present

## 2020-11-22 DIAGNOSIS — M7542 Impingement syndrome of left shoulder: Secondary | ICD-10-CM

## 2020-11-22 DIAGNOSIS — Z8669 Personal history of other diseases of the nervous system and sense organs: Secondary | ICD-10-CM

## 2020-11-22 DIAGNOSIS — M810 Age-related osteoporosis without current pathological fracture: Secondary | ICD-10-CM

## 2020-11-22 DIAGNOSIS — N1832 Chronic kidney disease, stage 3b: Secondary | ICD-10-CM

## 2020-11-30 ENCOUNTER — Ambulatory Visit: Payer: Medicare Other

## 2020-12-12 DIAGNOSIS — H401131 Primary open-angle glaucoma, bilateral, mild stage: Secondary | ICD-10-CM | POA: Diagnosis not present

## 2020-12-21 ENCOUNTER — Ambulatory Visit: Payer: Medicare Other | Admitting: Neurology

## 2020-12-27 ENCOUNTER — Telehealth: Payer: Self-pay

## 2020-12-27 NOTE — Telephone Encounter (Signed)
Faxed Order.

## 2020-12-27 NOTE — Telephone Encounter (Signed)
Patient called stating she called to schedule her bone density scan at [facility] and was told they needed approval from Dr. Estanislado Pandy before she can schedule.  Fax [phone]

## 2021-01-07 ENCOUNTER — Other Ambulatory Visit: Payer: Self-pay | Admitting: Family Medicine

## 2021-01-07 DIAGNOSIS — E781 Pure hyperglyceridemia: Secondary | ICD-10-CM

## 2021-01-09 ENCOUNTER — Telehealth: Payer: Self-pay

## 2021-01-09 MED ORDER — FENOFIBRATE 145 MG PO TABS: 145.0000 mg | ORAL_TABLET | Freq: Every day | ORAL | 0 refills | Status: DC

## 2021-01-09 NOTE — Telephone Encounter (Signed)
Faxed order to both fax numbers.

## 2021-01-09 NOTE — Telephone Encounter (Signed)
Patient called stating [facility] is having difficulty with their fax machine and never received bone scan order from Dr. Estanislado Pandy.  Patient was given two fax numbers and requested our office fax the orders to both numbers.  [phone] and [phone]

## 2021-01-12 NOTE — Telephone Encounter (Signed)
Please refax to [facility]. They did not receive fax. Please fax to [phone]

## 2021-01-12 NOTE — Telephone Encounter (Signed)
Order faxed again.

## 2021-01-18 ENCOUNTER — Encounter: Payer: Self-pay | Admitting: Neurology

## 2021-01-18 ENCOUNTER — Ambulatory Visit (INDEPENDENT_AMBULATORY_CARE_PROVIDER_SITE_OTHER): Payer: Medicare Other | Admitting: Neurology

## 2021-01-18 VITALS — BP 132/69 | HR 76 | Ht 60.5 in | Wt 115.5 lb

## 2021-01-18 DIAGNOSIS — F4320 Adjustment disorder, unspecified: Secondary | ICD-10-CM

## 2021-01-18 DIAGNOSIS — Z20822 Contact with and (suspected) exposure to covid-19: Secondary | ICD-10-CM | POA: Diagnosis not present

## 2021-01-18 NOTE — Progress Notes (Signed)
[facility]  PATIENT: Cynthia Powell DOB: [birth year]  REQUESTING CLINICIAN: Raoul Pitch, Renee A, DO HISTORY FROM: Patient  REASON FOR VISIT: Memory deficit    HISTORICAL  CHIEF COMPLAINT:  Chief Complaint  Patient presents with   New Patient (Initial Visit)    Rm 13. Alone. NP/internal referral for memory changes.    HISTORY OF PRESENT ILLNESS:  This is a 75 year old woman with past medical history of hearing loss, rotator cuff impairment, hypertension and hypothyroidism who is presenting with memory decline.  Patient reported she noted her memory decline since February of this year.  She is more forgetful, she misplaced items and cannot remember certain events.  She does live with her husband who did not complain about her memory, but mentioned that daughter has complained about her memory.  She currently drive, denies any accident, denies being lost in family or places.  She reported her husband handle the finances, he has always handled the finances.  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  Her older daughter also has type 1 diabetes and she always worry about her and now her granddaughter who lives with patient has type 1 diabetes too.  Husband recently diagnosed with PE and her cat is sick and she is worried that she will have to put her down in the next few weeks.    OTHER MEDICAL CONDITIONS: Rotator cuff impingement, Hearing los, hypertension, hypothyroidism.   REVIEW OF SYSTEMS: Full 14 system review of systems performed and negative with exception of: as noted in the HPI  ALLERGIES: Allergies  Allergen Reactions   Iodinated Diagnostic Agents     Other reaction(s): UNCONSCIOUSNESS   Corn-Containing Products Diarrhea   Fish Allergy  Diarrhea    Pt c/o Bluefin tuna and trout causing diarrhea.   Rice Diarrhea    HOME MEDICATIONS: Outpatient Medications Prior to Visit  Medication Sig Dispense Refill   cholecalciferol (VITAMIN D) 1000 units tablet Take 5,000 Units by mouth once a week.      Cyanocobalamin (VITAMIN B 12 PO) Take by mouth.     fenofibrate (TRICOR) 145 MG tablet Take 1 tablet (145 mg total) by mouth daily. 30 tablet 0   levothyroxine (SYNTHROID) 88 MCG tablet TAKE 1 TABLET DAILY BEFORE BREAKFAST 30 tablet 0   lisinopril (ZESTRIL) 30 MG tablet TAKE 1 TABLET DAILY 30 tablet 0   LUMIGAN 0.01 % SOLN      Magnesium Citrate 200 MG TABS      multivitamin-iron-minerals-folic acid (CENTRUM) chewable tablet Frequency:   Dosage:0.0     Instructions:  Note:     Ginkgo Biloba 40 MG TABS Take by mouth daily.     No facility-administered medications prior to visit.    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   Hyperlipidemia  using Krill oil; refuses statin   Hypertension    Osteoporosis    Sensory hearing loss, bilateral 2017   Bilateral hearing aids. [facility]   Thyroid disease 1980   Trigger finger    Wears hearing aid in both ears     PAST SURGICAL HISTORY: Past Surgical History:  Procedure Laterality Date   BREAST SURGERY  1990   biopsy   CESAREAN SECTION     x2   COLPOSCOPY     TONSILLECTOMY  1954    FAMILY HISTORY: Family History  Problem Relation Age of Onset   Arthritis Mother    Diabetes Mother    Stroke Mother    Hypertension Mother    Fibromyalgia Mother    Heart disease Father 67   Hypertension Father    Colon cancer  Father    Pernicious anemia Father    Breast cancer Sister        breast cancer   Melanoma Daughter        melanoma 2009   Diabetes Daughter    Hearing loss Maternal Grandfather    Lymphoma Daughter        Brain mets- on chemo ([facility])   OCD Daughter    Mental illness Daughter    Anxiety disorder Daughter     SOCIAL HISTORY: Social History   Socioeconomic History   Marital status: Married    Spouse name: Not on file   Number of children: Not on file   Years of education: Not on file   Highest education level: Not on file  Occupational History   Not on file  Tobacco Use   Smoking status: Never    Passive exposure: Yes   Smokeless tobacco: Never  Vaping Use   Vaping Use: Never used  Substance and Sexual Activity   Alcohol use: Not Currently   Drug use: No   Sexual activity: Never  Other Topics Concern   Not on file  Social History Narrative   Not on file   Social Determinants of Health   Financial Resource Strain: Not on file  Food Insecurity: Not on file  Transportation Needs: Not on file  Physical Activity: Not on file  Stress: Not on file  Social Connections: Not on file  Intimate Partner Violence: Not on file    PHYSICAL EXAM  GENERAL EXAM/CONSTITUTIONAL: Vitals:  Vitals:   01/18/21 0932  BP: 132/69  Pulse: 76  Weight: 115 lb 8 oz (52.4 kg)  Height: 5' 0.5" (1.537 m)   Body mass index is 22.19 kg/m. Wt Readings from Last 3 Encounters:  01/18/21 115 lb 8 oz (52.4 kg)  11/22/20 115 lb 9.6 oz (52.4 kg)  09/27/20 117 lb (53.1 kg)   Patient is in no distress; well developed, nourished and groomed; neck is supple  CARDIOVASCULAR: Examination of carotid arteries is normal; no carotid bruits Regular rate and rhythm, no murmurs Examination of peripheral vascular system by observation and palpation is normal  EYES: Pupils round and reactive to light, Visual fields full to confrontation, Extraocular movements intacts,   MUSCULOSKELETAL: Gait,  strength, tone, movements noted in Neurologic exam below  NEUROLOGIC: MENTAL STATUS:  MMSE - Rake Exam 01/18/2021 09/30/2018 01/28/2017  Orientation to time 5 5 5   Orientation to Place 5 5 5   Registration 3 3 3   Attention/ Calculation 5 5 5   Recall 3 3 3   Language- name 2 objects 2 2 2   Language- repeat 1 1 1   Language- follow 3 step  command 3 3 3   Language- read & follow direction 1 1 1   Write a sentence 1 1 1   Copy design 1 1 1   Total score 30 30 30    awake, alert, oriented to person, place and time recent and remote memory intact normal attention and concentration language fluent, comprehension intact, naming intact fund of knowledge appropriate  CRANIAL NERVE:  2nd, 3rd, 4th, 6th - pupils equal and reactive to light, visual fields full to confrontation, extraocular muscles intact, no nystagmus 5th - facial sensation symmetric 7th - facial strength symmetric 8th - hearing intact 9th - palate elevates symmetrically, uvula midline 11th - shoulder shrug symmetric 12th - tongue protrusion midline  MOTOR:  normal bulk and tone, full strength in the BUE, BLE  SENSORY:  normal and symmetric to light touch, pinprick, temperature, vibration  COORDINATION:  finger-nose-finger, fine finger movements normal  REFLEXES:  deep tendon reflexes present and symmetric  GAIT/STATION:  normal   DIAGNOSTIC DATA (LABS, IMAGING, TESTING) - I reviewed patient records, labs, notes, testing and imaging myself where available.  Lab Results  Component Value Date   WBC 8.4 04/05/2020   HGB 12.5 04/05/2020   HCT 36.7 04/05/2020   MCV 86.4 04/05/2020   PLT 323 04/05/2020      Component Value Date/Time   NA 142 04/05/2020 1338   K 4.1 04/05/2020 1338   CL 106 04/05/2020 1338   CO2 24 04/05/2020 1338   GLUCOSE 114 (H) 04/05/2020 1338   BUN 30 (H) 04/05/2020 1338   CREATININE 1.09 (H) 04/05/2020 1338   CALCIUM 10.2 04/05/2020 1338   PROT 7.3 04/08/2020 1019   ALBUMIN  4.6 04/30/2018 0908   AST 24 04/05/2020 1338   ALT 18 04/05/2020 1338   ALKPHOS 43 04/30/2018 0908   BILITOT 0.3 04/05/2020 1338   Lab Results  Component Value Date   CHOL 189 11/11/2018   HDL 43.40 11/11/2018   LDLCALC 116 (H) 11/11/2018   LDLDIRECT 143.0 01/21/2017   TRIG 150.0 (H) 11/11/2018   CHOLHDL 4 11/11/2018   Lab Results  Component Value Date   HGBA1C 5.5 01/21/2017   Lab Results  Component Value Date   VITAMINB12 >1506 (H) 04/08/2020   Lab Results  Component Value Date   TSH 1.25 04/05/2020      ASSESSMENT AND PLAN  75 y.o. year old female with past medical history of hypothyroidism, hypertension, rotator cuff injury who is presenting with trouble with memory described as being forgetful and misplacing items.  She denies being lost in familiar places, denies any car accident, currently drives, husband handling the finances, he has always been.  On exam she scored a 30 out of 30 on the Mini-Mental status exam.  On further history, there is report of ongoing stressor for the past 2 to 3 years due to granddaughter who lives with patient having type 1 diabetes,  daughter with diagnosis of lymphoma, completed chemotherapy and lymphoma is back again.  And her oldest daughter with type 1 diabetes too.  She also reported her husband was recently diagnosed with PE and her cat is acutely sick.  I do believe patient memory issues were related to this increase stressors, she does not have on exam and history any definitive objective evidence of dementia.  Have advised her to follow-up with her primary care and to return if worse.   1. Adjustment disorder, unspecified type      PLAN: Continue current medications  Follow up if worse   No  orders of the defined types were placed in this encounter.   No orders of the defined types were placed in this encounter.   Return if symptoms worsen or fail to improve.    Alric Ran, MD 01/18/2021, 1:09 PM  [facility] [address], [place],  [address] [phone]

## 2021-01-18 NOTE — Patient Instructions (Signed)
Continue current medications  Follow up if worse     There are well-accepted and sensible ways to reduce risk for Alzheimers disease and other degenerative brain disorders .  Exercise Daily Walk A daily 20 minute walk should be part of your routine. Disease related apathy can be a significant roadblock to exercise and the only way to overcome this is to make it a daily routine and perhaps have a reward at the end (something your loved one loves to eat or drink perhaps) or a personal trainer coming to the home can also be very useful. Most importantly, the patient is much more likely to exercise if the caregiver / spouse does it with him/her. In general a structured, repetitive schedule is best.  General Health: Any diseases which effect your body will effect your brain such as a pneumonia, urinary infection, blood clot, heart attack or stroke. Keep contact with your primary care doctor for regular follow ups.  Sleep. A good nights sleep is healthy for the brain. Seven hours is recommended. If you have insomnia or poor sleep habits we can give you some instructions. If you have sleep apnea wear your mask.  Diet: Eating a heart healthy diet is also a good idea; fish and poultry instead of red meat, nuts (mostly non-peanuts), vegetables, fruits, olive oil or canola oil (instead of butter), minimal salt (use other spices to flavor foods), whole grain rice, bread, cereal and pasta and wine in moderation.Research is now showing that the MIND diet, which is a combination of The Mediterranean diet and the DASH diet, is beneficial for cognitive processing and longevity. Information about this diet can be found in The MIND Diet, a book by Doyne Keel, MS, RDN, and online at [employer]book[employer].si  Finances, Power of Attorney and Advance Directives: You should consider putting legal safeguards in place with regard to financial and medical decision making. While the spouse always has power  of attorney for medical and financial issues in the absence of any form, you should consider what you want in case the spouse / caregiver is no longer around or capable of making decisions.      Heart-head connection  New research shows there are things we can do to reduce the risk of mild cognitive impairment and dementia.  Several conditions known to increase the risk of cardiovascular disease -- such as high blood pressure, diabetes and high cholesterol -- also increase the risk of developing Alzheimer's. Some autopsy studies show that as many as 57 percent of individuals with Alzheimer's disease also have cardiovascular disease.  A longstanding question is why some people develop hallmark Alzheimer's plaques and tangles but do not develop the symptoms of Alzheimer's. Vascular disease may help researchers eventually find an answer. Some autopsy studies suggest that plaques and tangles may be present in the brain without causing symptoms of cognitive decline unless the brain also shows evidence of vascular disease. More research is needed to better understand the link between vascular health and Alzheimer's.  Physical exercise and diet Regular physical exercise may be a beneficial strategy to lower the risk of Alzheimer's and vascular dementia. Exercise may directly benefit brain cells by increasing blood and oxygen flow in the brain. Because of its known cardiovascular benefits, a medically approved exercise program is a valuable part of any overall wellness plan.  Current evidence suggests that heart-healthy eating may also help protect the brain. Heart-healthy eating includes limiting the intake of sugar and saturated fats and making sure to eat plenty  of fruits, vegetables, and whole grains. No one diet is best. Two diets that have been studied and may be beneficial are the DASH (Dietary Approaches to Stop Hypertension) diet and the Mediterranean diet. The DASH diet emphasizes vegetables,  fruits and fat-free or low-fat dairy products; includes whole grains, fish, poultry, beans, seeds, nuts and vegetable oils; and limits sodium, sweets, sugary beverages and red meats. A Mediterranean diet includes relatively little red meat and emphasizes whole grains, fruits and vegetables, fish and shellfish, and nuts, olive oil and other healthy fats.  Social connections and intellectual activity A number of studies indicate that maintaining strong social connections and keeping mentally active as we age might lower the risk of cognitive decline and Alzheimer's. Experts are not certain about the reason for this association. It may be due to direct mechanisms through which social and mental stimulation strengthen connections between nerve cells in the brain.  Head trauma There appears to be a strong link between future risk of Alzheimer's and serious head trauma, especially when injury involves loss of consciousness. You can help reduce your risk of Alzheimer's by protecting your head.  Wear a seat belt  Use a helmet when participating in sports  "Fall-proof" your home   What you can do now While research is not yet conclusive, certain lifestyle choices, such as physical activity and diet, may help support brain health and prevent Alzheimer's. Many of these lifestyle changes have been shown to lower the risk of other diseases, like heart disease and diabetes, which have been linked to Alzheimer's. With few drawbacks and plenty of known benefits, healthy lifestyle choices can improve your health and possibly protect your brain.  Learn more about brain health. You can help increase our knowledge by considering participation in a clinical study. Our free clinical trial matching services, Trial[employer], can help you find clinical trials in your area that are seeking volunteers.

## 2021-01-26 DIAGNOSIS — Z20822 Contact with and (suspected) exposure to covid-19: Secondary | ICD-10-CM | POA: Diagnosis not present

## 2021-01-31 ENCOUNTER — Ambulatory Visit (INDEPENDENT_AMBULATORY_CARE_PROVIDER_SITE_OTHER): Payer: Medicare Other | Admitting: Family Medicine

## 2021-01-31 ENCOUNTER — Other Ambulatory Visit: Payer: Self-pay

## 2021-01-31 ENCOUNTER — Encounter: Payer: Self-pay | Admitting: Family Medicine

## 2021-01-31 VITALS — BP 136/57 | HR 75 | Temp 98.6°F | Ht 61.0 in | Wt 115.0 lb

## 2021-01-31 DIAGNOSIS — E039 Hypothyroidism, unspecified: Secondary | ICD-10-CM | POA: Diagnosis not present

## 2021-01-31 DIAGNOSIS — Z5320 Procedure and treatment not carried out because of patient's decision for unspecified reasons: Secondary | ICD-10-CM

## 2021-01-31 DIAGNOSIS — E781 Pure hyperglyceridemia: Secondary | ICD-10-CM

## 2021-01-31 DIAGNOSIS — N1832 Chronic kidney disease, stage 3b: Secondary | ICD-10-CM | POA: Diagnosis not present

## 2021-01-31 DIAGNOSIS — R7309 Other abnormal glucose: Secondary | ICD-10-CM | POA: Diagnosis not present

## 2021-01-31 DIAGNOSIS — Z780 Asymptomatic menopausal state: Secondary | ICD-10-CM | POA: Diagnosis not present

## 2021-01-31 DIAGNOSIS — E782 Mixed hyperlipidemia: Secondary | ICD-10-CM | POA: Diagnosis not present

## 2021-01-31 DIAGNOSIS — E559 Vitamin D deficiency, unspecified: Secondary | ICD-10-CM | POA: Diagnosis not present

## 2021-01-31 DIAGNOSIS — M810 Age-related osteoporosis without current pathological fracture: Secondary | ICD-10-CM | POA: Diagnosis not present

## 2021-01-31 DIAGNOSIS — I10 Essential (primary) hypertension: Secondary | ICD-10-CM | POA: Diagnosis not present

## 2021-01-31 MED ORDER — LEVOTHYROXINE SODIUM 88 MCG PO TABS: 88.0000 ug | ORAL_TABLET | Freq: Every day | ORAL | 3 refills | Status: DC

## 2021-01-31 MED ORDER — FENOFIBRATE 145 MG PO TABS: 145.0000 mg | ORAL_TABLET | Freq: Every day | ORAL | 3 refills | Status: DC

## 2021-01-31 MED ORDER — LISINOPRIL 30 MG PO TABS: 30.0000 mg | ORAL_TABLET | Freq: Every day | ORAL | 1 refills | Status: DC

## 2021-01-31 NOTE — Progress Notes (Signed)
This visit occurred during the SARS-CoV-2 public health emergency.  Safety protocols were in place, including screening questions prior to the visit, additional usage of staff PPE, and extensive cleaning of exam room while observing appropriate contact time as indicated for disinfecting solutions.    Cynthia Powell , 09-15-1945, 75 y.o., female MRN: [id number] Patient Care Team    Relationship Specialty Notifications Start End  Ma Hillock, DO PCP - General Family Medicine  03/02/15   Odette Fraction  Optometry  09/30/18   [facility]  Audiology  09/30/18     Chief Complaint  Patient presents with   Hypertension    [place]; pt is not fasting     Subjective: Cynthia Powell is a 75 y.o. female present for [facility] Hypertension/hyperlipidemia- statin declined:Patient reports compliance with lisinopril 30 mg QD.Patient denies chest pain, shortness of breath, dizziness or lower extremity edema.  Pt does  take daily baby ASA. Pt is not prescribed statin (refuses statin). She is taking fenofibrate and is tolerating.  Diet: does not closely monitor.  Exercise: does not routinely exercise.  RF: HTN, HLD, FHX stroke (mother)   Hypothyroid:Pt reports compliance  with 88 mcg synthroid daily on an empty stomach. Denies symptoms    Vit d deficiency/osteoporosis: Pt continues to take 5000 u vit d once a week  Depression screen [facility] 2/9 01/31/2021 04/05/2020 11/25/2019 09/30/2018 04/30/2018  Decreased Interest 0 0 0 0 0  Down, Depressed, Hopeless 0 0 0 0 0  PHQ - 2 Score 0 0 0 0 0    Allergies  Allergen Reactions   Iodinated Diagnostic Agents     Other reaction(s): UNCONSCIOUSNESS   Corn-Containing Products Diarrhea   Fish Allergy Diarrhea    Pt c/o Bluefin tuna and trout causing diarrhea.   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   Hyperlipidemia    using Krill oil; refuses statin   Hypertension    Osteoporosis    Sensory hearing loss, bilateral 2017   Bilateral hearing aids. [facility]   Spider veins of both lower extremities 09/12/2015   Thyroid disease 1980   Trigger finger    Wears hearing aid in both ears    Past Surgical History:  Procedure Laterality Date   BREAST SURGERY  1990   biopsy   CESAREAN SECTION     x2   COLPOSCOPY     TONSILLECTOMY  1954   Family History  Problem Relation Age of Onset   Arthritis Mother    Diabetes Mother    Stroke Mother    Hypertension Mother    Fibromyalgia Mother    Heart disease Father 8   Hypertension Father    Colon cancer Father    Pernicious anemia Father    Breast cancer Sister        breast cancer   Melanoma Daughter        melanoma 2009  Diabetes Daughter    Hearing loss Maternal Grandfather    Lymphoma Daughter        Brain mets- on chemo ([facility])   OCD Daughter    Mental illness Daughter    Anxiety disorder Daughter    Allergies as of 01/31/2021       Reactions   Iodinated Diagnostic Agents    Other reaction(s): UNCONSCIOUSNESS   Corn-containing Products Diarrhea   Fish Allergy Diarrhea   Pt c/o Bluefin tuna and trout causing diarrhea.   Rice Diarrhea        Medication List        Accurate as of January 31, 2021 11:59 PM. If you have any questions, ask your nurse or doctor.          cholecalciferol 1000 units tablet Commonly known as: VITAMIN D Take 5,000 Units by mouth once a week.   CO Q 10 PO Take by mouth.   fenofibrate 145 MG tablet Commonly known as: Tricor Take 1 tablet (145 mg total) by mouth daily.   levothyroxine 88 MCG tablet Commonly  known as: SYNTHROID Take 1 tablet (88 mcg total) by mouth daily before breakfast.   lisinopril 30 MG tablet Commonly known as: ZESTRIL Take 1 tablet (30 mg total) by mouth daily.   Lumigan 0.01 % Soln Generic drug: bimatoprost   Magnesium Citrate 200 MG Tabs   multivitamin-iron-minerals-folic acid chewable tablet Frequency:   Dosage:0.0     Instructions:  Note:   VITAMIN B 12 PO Take by mouth.        All past medical history, surgical history, allergies, family history, immunizations andmedications were updated in the EMR today and reviewed under the history and medication portions of their EMR.     ROS: Negative, with the exception of above mentioned in HPI   Objective:  BP (!) 136/57   Pulse 75   Temp 98.6 F (37 C) (Oral)   Ht 5\' 1"  (1.549 m)   Wt 115 lb (52.2 kg)   SpO2 100%   BMI 21.73 kg/m  Body mass index is 21.73 kg/m. Gen: Afebrile. No acute distress. Nontoxic, pleasant female.  HENT: AT. [place]. No cough or hoarseness.  Eyes:Pupils Equal Round Reactive to light, Extraocular movements intact,  Conjunctiva without redness, discharge or icterus. Neck/lymp/endocrine: Supple,no lymphadenopathy, no thyromegaly CV: RRR no murmur, no edema, +2/4 P posterior tibialis pulses Chest: CTAB, no wheeze or crackles Skin: no rashes, purpura or petechiae.  Neuro: Normal gait. PERLA. EOMi. Alert. Oriented x3 Psych: Normal affect, dress and demeanor. Normal speech. Normal thought content and judgment.    No results found. No results found. Results for orders placed or performed in visit on 01/31/21 (from the past 24 hour(s))  CBC     Status: Abnormal   Collection Time: 01/31/21  2:59 PM  Result Value Ref Range   WBC 9.1 3.8 - 10.8 Thousand/uL   RBC 3.99 3.80 - 5.10 Million/uL   Hemoglobin 11.6 (L) 11.7 - 15.5 g/dL   HCT 34.6 (L) 35.0 - 45.0 %   MCV 86.7 80.0 - 100.0 fL   MCH 29.1 27.0 - 33.0 pg   MCHC 33.5 32.0 - 36.0 g/dL   RDW 12.1 11.0 - 15.0 %   Platelets 280 140  - 400 Thousand/uL   MPV 10.7 7.5 - 12.5 fL  Comprehensive metabolic panel     Status: Abnormal   Collection Time: 01/31/21  2:59 PM  Result Value Ref Range   Glucose, Bld 95 65 -  99 mg/dL   BUN 31 (H) 7 - 25 mg/dL   Creat 0.90 0.60 - 1.00 mg/dL   BUN/Creatinine Ratio 34 (H) 6 - 22 (calc)   Sodium 142 135 - 146 mmol/L   Potassium 4.1 3.5 - 5.3 mmol/L   Chloride 108 98 - 110 mmol/L   CO2 23 20 - 32 mmol/L   Calcium 9.8 8.6 - 10.4 mg/dL   Total Protein 6.8 6.1 - 8.1 g/dL   Albumin 4.1 3.6 - 5.1 g/dL   Globulin 2.7 1.9 - 3.7 g/dL (calc)   AG Ratio 1.5 1.0 - 2.5 (calc)   Total Bilirubin 0.3 0.2 - 1.2 mg/dL   Alkaline phosphatase (APISO) 47 37 - 153 U/L   AST 24 10 - 35 U/L   ALT 17 6 - 29 U/L  Hemoglobin A1c     Status: None   Collection Time: 01/31/21  2:59 PM  Result Value Ref Range   Hgb A1c MFr Bld 5.6 <5.7 % of total Hgb   Mean Plasma Glucose 114 mg/dL   eAG (mmol/L) 6.3 mmol/L  Lipid panel     Status: Abnormal   Collection Time: 01/31/21  2:59 PM  Result Value Ref Range   Cholesterol 152 <200 mg/dL   HDL 37 (L) > OR = 50 mg/dL   Triglycerides 148 <150 mg/dL   LDL Cholesterol (Calc) 90 mg/dL (calc)   Total CHOL/HDL Ratio 4.1 <5.0 (calc)   Non-HDL Cholesterol (Calc) 115 <130 mg/dL (calc)  TSH     Status: None   Collection Time: 01/31/21  2:59 PM  Result Value Ref Range   TSH 0.63 0.40 - 4.50 mIU/L  Vitamin D (25 hydroxy)     Status: None   Collection Time: 01/31/21  2:59 PM  Result Value Ref Range   Vit D, 25-Hydroxy 37 30 - 100 ng/mL     Assessment/Plan: Cynthia Powell is a 75 y.o. female present for OV for  Hypothyroidism, unspecified type - tolerating levo 88 mcg.  - repeat tsh today.   Stage 3b chronic kidney disease (HCC)/vitamin D deficiency/osteoporosis -Patient was strongly encouraged to increase her hydration.  She does not drink water. -We will attempt to avoid long-term NSAIDs if possible. -GFR stable at 53 CMP today Vitamin D levels collected today>  patient reports she is taking 5000 units daily  Hypertension/hyperlipidemia- statin declined: Stable Continue lisinopril 20 mg QD.  Continue fenofibrate Statin  refused Follow-up 5.5 months  Elevated glucose - Hemoglobin A1c  Reviewed expectations re: course of current medical issues. Discussed self-management of symptoms. Outlined signs and symptoms indicating need for more acute intervention. Patient verbalized understanding and all questions were answered. Patient received an After-Visit Summary.    Orders Placed This Encounter  Procedures   CBC   Comprehensive metabolic panel   Hemoglobin A1c   Lipid panel   TSH   Vitamin D (25 hydroxy)    Meds ordered this encounter  Medications   fenofibrate (TRICOR) 145 MG tablet    Sig: Take 1 tablet (145 mg total) by mouth daily.    Dispense:  90 tablet    Refill:  3    MUST HAVE OV FOR FURTHER REFILLS   levothyroxine (SYNTHROID) 88 MCG tablet    Sig: Take 1 tablet (88 mcg total) by mouth daily before breakfast.    Dispense:  90 tablet    Refill:  3   lisinopril (ZESTRIL) 30 MG tablet    Sig: Take 1 tablet (30 mg total)  by mouth daily.    Dispense:  90 tablet    Refill:  1    Referral Orders  No referral(s) requested today     Note is dictated utilizing voice recognition software. Although note has been proof read prior to signing, occasional typographical errors still can be missed. If any questions arise, please do not hesitate to call for verification.   electronically signed by:  Howard Pouch, DO  [place]

## 2021-01-31 NOTE — Patient Instructions (Signed)
Great to see you today.  I have refilled the medication(s) we provide.   If labs were collected, we will inform you of lab results once received either by echart message or telephone call.   - echart message- for normal results that have been seen by the patient already.   - telephone call: abnormal results or if patient has not viewed results in their echart.  

## 2021-02-01 ENCOUNTER — Telehealth: Payer: Self-pay | Admitting: Family Medicine

## 2021-02-01 ENCOUNTER — Telehealth: Payer: Self-pay | Admitting: *Deleted

## 2021-02-01 DIAGNOSIS — D649 Anemia, unspecified: Secondary | ICD-10-CM | POA: Insufficient documentation

## 2021-02-01 LAB — COMPREHENSIVE METABOLIC PANEL
AG Ratio: 1.5 (calc) (ref 1.0–2.5)
ALT: 17 U/L (ref 6–29)
AST: 24 U/L (ref 10–35)
Albumin: 4.1 g/dL (ref 3.6–5.1)
Alkaline phosphatase (APISO): 47 U/L (ref 37–153)
BUN/Creatinine Ratio: 34 (calc) — ABNORMAL HIGH (ref 6–22)
BUN: 31 mg/dL — ABNORMAL HIGH (ref 7–25)
CO2: 23 mmol/L (ref 20–32)
Calcium: 9.8 mg/dL (ref 8.6–10.4)
Chloride: 108 mmol/L (ref 98–110)
Creat: 0.9 mg/dL (ref 0.60–1.00)
Globulin: 2.7 g/dL (calc) (ref 1.9–3.7)
Glucose, Bld: 95 mg/dL (ref 65–99)
Potassium: 4.1 mmol/L (ref 3.5–5.3)
Sodium: 142 mmol/L (ref 135–146)
Total Bilirubin: 0.3 mg/dL (ref 0.2–1.2)
Total Protein: 6.8 g/dL (ref 6.1–8.1)

## 2021-02-01 LAB — LIPID PANEL
Cholesterol: 152 mg/dL (ref <200)
HDL: 37 mg/dL — ABNORMAL LOW (ref >=50)
LDL Cholesterol (Calc): 90 mg/dL (calc)
Non-HDL Cholesterol (Calc): 115 mg/dL (calc) (ref <130)
Total CHOL/HDL Ratio: 4.1 (calc) (ref <5.0)
Triglycerides: 148 mg/dL (ref <150)

## 2021-02-01 LAB — CBC
HCT: 34.6 % — ABNORMAL LOW (ref 35.0–45.0)
Hemoglobin: 11.6 g/dL — ABNORMAL LOW (ref 11.7–15.5)
MCH: 29.1 pg (ref 27.0–33.0)
MCHC: 33.5 g/dL (ref 32.0–36.0)
MCV: 86.7 fL (ref 80.0–100.0)
MPV: 10.7 fL (ref 7.5–12.5)
Platelets: 280 10*3/uL (ref 140–400)
RBC: 3.99 10*6/uL (ref 3.80–5.10)
RDW: 12.1 % (ref 11.0–15.0)
WBC: 9.1 10*3/uL (ref 3.8–10.8)

## 2021-02-01 LAB — HEMOGLOBIN A1C
Hgb A1c MFr Bld: 5.6 % of total Hgb (ref <5.7)
Mean Plasma Glucose: 114 mg/dL
eAG (mmol/L): 6.3 mmol/L

## 2021-02-01 LAB — VITAMIN D 25 HYDROXY (VIT D DEFICIENCY, FRACTURES): Vit D, 25-Hydroxy: 37 ng/mL (ref 30–100)

## 2021-02-01 LAB — TSH: TSH: 0.63 mIU/L (ref 0.40–4.50)

## 2021-02-01 NOTE — Addendum Note (Signed)
Addended by: Kavin Leech on: 02/01/2021 02:02 PM   Modules accepted: Orders

## 2021-02-01 NOTE — Telephone Encounter (Signed)
Received DEXA results from [facility]. ( Results in Care Everywhere)  Date of Scan: 01/31/2021  Lowest T-score:-2.8  BMD:0.535  Lowest site measured:Left femur neck  Dx: Osteoporosis   Significant changes in BMD and site measured (5% and above):n/a  Current Regimen:Calcium through diet and Vitamin D  Recommendation:Schedule an appointment for patient to discuss treatment options.  Reviewed by:Hazel Sams, PA-C  Next Appointment:  05/22/2021  Patient advised of results and scheduled for an appointment for 02/13/2021 at 10:40 am.

## 2021-02-01 NOTE — Telephone Encounter (Signed)
Please call patient Liver, kidney and thyroid function are normal electrolytes are normal Diabetes screening/A1c is normal  Cholesterol panel looks great and is at goal continue current regimen. Vitamin D levels are normal.  Continue current regimen  Her blood cells are just mildly low in the anemic range.  Her hemoglobin is 11.6, normal is above 12.  She has always been in the 12 range in the past.  Her hematocrit, which is another marker of red blood cells/anemia has dropped as well to 34.6.  Both of these are only mildly lower than normal, but could indicate loss of blood.   -I would like to test her iron panel by lab appointment only.  I would also like her to complete fecal occult cards x3 at home to see if she is losing blood per her colon.  Many people do not witness  blood being lost, it is usually a small amount over time, if it is indeed coming from the colon.     -Completing the cards will tell us if there is any blood currently in her stool.  If so we would need to get her to gastroenterology for further evaluation.   Please place order for FOBTx3

## 2021-02-01 NOTE — Telephone Encounter (Signed)
Spoke with pt regarding labs and instructions.   

## 2021-02-02 ENCOUNTER — Ambulatory Visit (INDEPENDENT_AMBULATORY_CARE_PROVIDER_SITE_OTHER): Payer: Medicare Other

## 2021-02-02 ENCOUNTER — Other Ambulatory Visit: Payer: Self-pay

## 2021-02-02 DIAGNOSIS — D649 Anemia, unspecified: Secondary | ICD-10-CM

## 2021-02-02 NOTE — Progress Notes (Signed)
Pt received blood draw on left median cubital vein. Pt tolerated blood draw well. Pt to return hemoccult kit Monday.

## 2021-02-03 LAB — IRON,TIBC AND FERRITIN PANEL
%SAT: 30 % (ref 16–45)
Ferritin: 89 ng/mL (ref 16–288)
Iron: 138 ug/dL (ref 45–160)
TIBC: 467 ug/dL — ABNORMAL HIGH (ref 250–450)

## 2021-02-07 ENCOUNTER — Telehealth: Payer: Self-pay | Admitting: Family Medicine

## 2021-02-07 LAB — HEMOCCULT SLIDES (X 3 CARDS)
Fecal Occult Blood: NEGATIVE
OCCULT 1: NEGATIVE
OCCULT 2: NEGATIVE
OCCULT 3: NEGATIVE
OCCULT 4: NEGATIVE
OCCULT 5: NEGATIVE

## 2021-02-07 NOTE — Telephone Encounter (Signed)
Please inform patient her Hemoccult cards were  negative for blood. Since her iron panel was normal and her anemia was only very mild anemia, we will continue to monitor condition.  We will collect a CBC again at her follow-up appointment for her blood pressure in 6 months.

## 2021-02-07 NOTE — Telephone Encounter (Signed)
Spoke with pt regarding results/recommendations,voiced understanding. ? ?

## 2021-02-07 NOTE — Telephone Encounter (Signed)
Spoke with pt regarding results/recommendations,voiced understanding. Pt will call tomorrow to schedule a f/u appt with provider.

## 2021-02-07 NOTE — Telephone Encounter (Signed)
Pt called and wanted to know based on the latest lab results what does she need to work on next since some things were corrected now. Please advise. Call back [phone]

## 2021-02-10 NOTE — Progress Notes (Deleted)
Office Visit Note  Patient: Cynthia Powell             Date of Birth: 09-29-1945           MRN: [id number]             PCP: Ma Hillock, DO Referring: Ma Hillock, DO Visit Date: 02/13/2021 Occupation: @GUAROCC @  Subjective:  Discuss DEXA  History of Present Illness: GLENDORIS NODARSE is a 75 y.o. female with history of osteoporosis and osteoarthritis.    Activities of Daily Living:  Patient reports morning stiffness for *** {minute/hour:19697}.   Patient {ACTIONS;DENIES/REPORTS:21021675::"Denies"} nocturnal pain.  Difficulty dressing/grooming: {ACTIONS;DENIES/REPORTS:21021675::"Denies"} Difficulty climbing stairs: {ACTIONS;DENIES/REPORTS:21021675::"Denies"} Difficulty getting out of chair: {ACTIONS;DENIES/REPORTS:21021675::"Denies"} Difficulty using hands for taps, buttons, cutlery, and/or writing: {ACTIONS;DENIES/REPORTS:21021675::"Denies"}  No Rheumatology ROS completed.   PMFS History:  Patient Active Problem List   Diagnosis Date Noted   Anemia 02/01/2021   Memory changes 09/27/2020   Snoring 09/27/2020   Enthesopathy of hip region 04/05/2020   Herpes zoster 04/05/2020   Sciatica 04/05/2020   Polyarthralgia 04/05/2020   B12 deficiency 04/05/2020   CKD (chronic kidney disease) stage 3, GFR 30-59 ml/min (HCC) 11/11/2018   Statin declined 05/01/2018   Vitamin D deficiency 10/29/2017   Sensory hearing loss, 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   Uses hearing aid 03/08/2015   Osteoporosis 03/08/2015   Hypothyroidism 03/02/2015   Essential hypertension 03/02/2015   Hyperlipidemia 03/02/2015   Glaucoma 39/53/2023   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   Hyperlipidemia    using Krill oil; refuses statin   Hypertension    Osteoporosis    Sensory hearing loss, bilateral 2017   Bilateral hearing aids. [facility]   Spider veins of both lower extremities 09/12/2015   Thyroid disease 1980   Trigger finger    Wears hearing aid in both ears     Family History  Problem Relation Age of Onset   Arthritis Mother    Diabetes Mother    Stroke Mother    Hypertension Mother    Fibromyalgia Mother    Heart disease Father 49   Hypertension Father    Colon cancer Father    Pernicious anemia Father    Breast cancer Sister        breast cancer   Melanoma Daughter        melanoma 2009   Diabetes Daughter    Hearing loss Maternal Grandfather    Lymphoma Daughter        Brain mets- on chemo ([facility])   OCD Daughter    Mental illness Daughter    Anxiety disorder Daughter    Past Surgical History:  Procedure Laterality Date   BREAST SURGERY  1990   biopsy   CESAREAN SECTION     x2   [place]   Social History   Social History Narrative   Not on file   Immunization History  Administered Date(s) Administered   Fluad Quad(high Dose 65+)  12/01/2018   Influenza Split 12/26/2007, 10/21/2008, 11/15/2011   Influenza Whole 01/01/2002   Influenza, Quadrivalent, Recombinant, Inj, Pf 11/21/2017   Influenza-Unspecified 01/31/2004, 02/10/2005, 11/20/2013, 10/20/2014, 11/22/2016, 11/29/2019, 11/13/2020   MMR 09/23/2012, 10/22/2012   PFIZER(Purple Top)SARS-COV-2 Vaccination 03/21/2019, 04/18/2019, 12/25/2019, 07/04/2020, 11/09/2020   Pneumococcal Conjugate-13 12/31/2011   Pneumococcal Polysaccharide-23 11/05/2006, 01/28/2017   Td 01/01/2002   Tdap 10/02/2018   Zoster Recombinat (Shingrix) 10/02/2018, 12/10/2018   Zoster, Live 06/25/2013     Objective: Vital Signs: There were no vitals taken for this visit.   Physical Exam Vitals and nursing note  reviewed.  Constitutional:      Appearance: She is well-developed.  HENT:     Head: Normocephalic and atraumatic.  Eyes:     Conjunctiva/sclera: Conjunctivae normal.  Pulmonary:     Effort: Pulmonary effort is normal.  Abdominal:     Palpations: Abdomen is soft.  Musculoskeletal:     Cervical back: Normal range of motion.  Skin:    General: Skin is warm and dry.     Capillary Refill: Capillary refill takes less than 2 seconds.  Neurological:     Mental Status: She is alert and oriented to person, place, and time.  Psychiatric:        Behavior: Behavior normal.     Musculoskeletal Exam: ***  CDAI Exam: CDAI Score: -- Patient Global: --; Provider Global: -- Swollen: --; Tender: -- Joint Exam 02/13/2021   No joint exam has been documented for this visit   There is currently no information documented on the homunculus. Go to the Rheumatology activity and complete the homunculus joint exam.  Investigation: No additional findings.  Imaging: No results found.  Recent Labs: Lab Results  Component Value Date   WBC 9.1 01/31/2021   HGB 11.6 (L) 01/31/2021   PLT 280 01/31/2021   NA 142 01/31/2021   K 4.1 01/31/2021   CL 108 01/31/2021   CO2 23 01/31/2021   GLUCOSE 95 01/31/2021   BUN 31 (H) 01/31/2021   CREATININE 0.90 01/31/2021   BILITOT 0.3 01/31/2021   ALKPHOS 43 04/30/2018   AST 24 01/31/2021   ALT 17 01/31/2021   PROT 6.8 01/31/2021   ALBUMIN 4.6 04/30/2018   CALCIUM 9.8 01/31/2021    Speciality Comments: No specialty comments available.  Procedures:  No procedures performed Allergies: Iodinated diagnostic agents, Corn-containing products, Fish allergy, and Rice   Assessment / Plan:     Visit Diagnoses: No diagnosis found.  Orders: No orders of the defined types were placed in this encounter.  No orders of the defined types were placed in this encounter.   Face-to-face time spent with patient was *** minutes. Greater than 50% of time was spent  in counseling and coordination of care.  Follow-Up Instructions: No follow-ups on file.   Ofilia Neas, PA-C  Note - This record has been created using Dragon software.  Chart creation errors have been sought, but may not always  have been located. Such creation errors do not reflect on  the standard of medical care.

## 2021-02-13 ENCOUNTER — Ambulatory Visit: Payer: Medicare Other | Admitting: Physician Assistant

## 2021-02-13 DIAGNOSIS — E559 Vitamin D deficiency, unspecified: Secondary | ICD-10-CM

## 2021-02-13 DIAGNOSIS — M19041 Primary osteoarthritis, right hand: Secondary | ICD-10-CM

## 2021-02-13 DIAGNOSIS — I8393 Asymptomatic varicose veins of bilateral lower extremities: Secondary | ICD-10-CM

## 2021-02-13 DIAGNOSIS — I10 Essential (primary) hypertension: Secondary | ICD-10-CM

## 2021-02-13 DIAGNOSIS — L280 Lichen simplex chronicus: Secondary | ICD-10-CM

## 2021-02-13 DIAGNOSIS — R5383 Other fatigue: Secondary | ICD-10-CM

## 2021-02-13 DIAGNOSIS — Z8639 Personal history of other endocrine, nutritional and metabolic disease: Secondary | ICD-10-CM

## 2021-02-13 DIAGNOSIS — E538 Deficiency of other specified B group vitamins: Secondary | ICD-10-CM

## 2021-02-13 DIAGNOSIS — H903 Sensorineural hearing loss, bilateral: Secondary | ICD-10-CM

## 2021-02-13 DIAGNOSIS — Z8669 Personal history of other diseases of the nervous system and sense organs: Secondary | ICD-10-CM

## 2021-02-13 DIAGNOSIS — R768 Other specified abnormal immunological findings in serum: Secondary | ICD-10-CM

## 2021-02-13 DIAGNOSIS — M7542 Impingement syndrome of left shoulder: Secondary | ICD-10-CM

## 2021-02-13 DIAGNOSIS — M810 Age-related osteoporosis without current pathological fracture: Secondary | ICD-10-CM

## 2021-02-13 DIAGNOSIS — N1832 Chronic kidney disease, stage 3b: Secondary | ICD-10-CM

## 2021-02-26 DIAGNOSIS — Z20822 Contact with and (suspected) exposure to covid-19: Secondary | ICD-10-CM | POA: Diagnosis not present

## 2021-04-10 DIAGNOSIS — H401131 Primary open-angle glaucoma, bilateral, mild stage: Secondary | ICD-10-CM | POA: Diagnosis not present

## 2021-04-21 ENCOUNTER — Other Ambulatory Visit: Payer: Self-pay

## 2021-04-21 ENCOUNTER — Ambulatory Visit (INDEPENDENT_AMBULATORY_CARE_PROVIDER_SITE_OTHER): Payer: Medicare Other | Admitting: Family Medicine

## 2021-04-21 ENCOUNTER — Encounter: Payer: Self-pay | Admitting: Family Medicine

## 2021-04-21 VITALS — BP 127/72 | HR 75 | Temp 98.4°F | Ht 61.0 in | Wt 115.0 lb

## 2021-04-21 DIAGNOSIS — R0989 Other specified symptoms and signs involving the circulatory and respiratory systems: Secondary | ICD-10-CM

## 2021-04-21 DIAGNOSIS — R413 Other amnesia: Secondary | ICD-10-CM

## 2021-04-21 DIAGNOSIS — D649 Anemia, unspecified: Secondary | ICD-10-CM | POA: Diagnosis not present

## 2021-04-21 DIAGNOSIS — Z5320 Procedure and treatment not carried out because of patient's decision for unspecified reasons: Secondary | ICD-10-CM

## 2021-04-21 NOTE — Progress Notes (Signed)
This visit occurred during the SARS-CoV-2 public health emergency.  Safety protocols were in place, including screening questions prior to the visit, additional usage of staff PPE, and extensive cleaning of exam room while observing appropriate contact time as indicated for disinfecting solutions.    Cynthia Powell , 12/08/1945, 76 y.o., female MRN: [id number] Patient Care Team    Relationship Specialty Notifications Start End  Ma Hillock, DO PCP - General Family Medicine  03/02/15   Odette Fraction  Optometry  09/30/18   [facility]  Audiology  09/30/18     Chief Complaint  Patient presents with   Hypertension    [facility]; pt is not fasting     Subjective: Cynthia Powell is a 76 y.o. female present for [facility] Hypertension/hyperlipidemia- statin declined:Patient reports compliance with lisinopril 30 mg QD. Patient denies chest pain, shortness of breath, dizziness or lower extremity edema.  Pt does  take daily baby ASA. Pt is not prescribed statin (refuses statin). She is taking fenofibrate and is tolerating.  Diet: does not closely monitor.  Exercise: does not routinely exercise.  RF: HTN, HLD, FHX stroke (mother)   Hypothyroid:Pt reports compliance  with 88 mcg synthroid daily on an empty stomach. Denies symptoms    Vit d deficiency/osteoporosis: Pt continues to take 5000 u vit d once a week.  She has declined bisphosphonate start.  Memory: She is followed up with neurology on her memory deficits.  She feels like there is maybe a little improvement in her memory and much of it was to do with the stress in her life.  Mild anemia: Last labs showed very mild anemia with normal iron and negative FOBT.  She is due for CBC today to ensure stable  Depression screen [facility] 2/9 01/31/2021 04/05/2020 11/25/2019 09/30/2018 04/30/2018  Decreased Interest 0 0 0 0 0  Down, Depressed, Hopeless 0 0 0 0 0  PHQ - 2 Score 0 0 0 0 0    Allergies  Allergen Reactions   Iodinated  Contrast Media     Other reaction(s): UNCONSCIOUSNESS   Corn-Containing Products Diarrhea   Fish Allergy Diarrhea    Pt c/o Bluefin tuna and trout causing diarrhea.   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   Hyperlipidemia    using Krill oil; refuses statin   Hypertension    Osteoporosis    Sensory hearing loss, bilateral 2017   Bilateral hearing aids. [facility]   Spider veins of both lower extremities 09/12/2015   Thyroid disease 1980   Trigger finger    Wears hearing aid in both ears    Past Surgical History:  Procedure Laterality Date   BREAST SURGERY  1990   biopsy   CESAREAN SECTION     x2   COLPOSCOPY     TONSILLECTOMY  1954   Family History  Problem Relation Age of Onset   Arthritis Mother  Diabetes Mother    Stroke Mother    Hypertension Mother    Fibromyalgia Mother    Heart disease Father 10   Hypertension Father    Colon cancer Father    Pernicious anemia Father    Breast cancer Sister        breast cancer   Melanoma Daughter        melanoma 2009   Diabetes Daughter    Hearing loss Maternal Grandfather    Lymphoma Daughter        Brain mets- on chemo ([facility])   OCD Daughter    Mental illness Daughter    Anxiety disorder Daughter    Allergies as of 04/21/2021       Reactions   Iodinated Contrast Media    Other reaction(s): UNCONSCIOUSNESS   Corn-containing Products Diarrhea   Fish Allergy Diarrhea   Pt c/o Bluefin tuna and trout causing diarrhea.   Rice Diarrhea        Medication List        Accurate as of April 21, 2021  1:29 PM. If you have any questions, ask your nurse or doctor.          cholecalciferol 1000 units tablet Commonly known as: VITAMIN D Take 5,000 Units by mouth once a week.   CO Q 10 PO Take by mouth.   fenofibrate 145 MG tablet Commonly known as: Tricor Take 1 tablet (145 mg total) by mouth daily.   levothyroxine 88 MCG tablet Commonly known as: SYNTHROID Take 1 tablet (88 mcg total) by mouth daily before breakfast.   lisinopril 30 MG tablet Commonly known as: ZESTRIL Take 1 tablet (30 mg total) by mouth daily.   Lumigan 0.01 % Soln Generic drug: bimatoprost   Magnesium Citrate 200 MG Tabs   multivitamin-iron-minerals-folic acid chewable tablet Frequency:   Dosage:0.0     Instructions:  Note:   VITAMIN B 12 PO Take by mouth.        All past medical history, surgical history, allergies, family history, immunizations andmedications were updated in the EMR today and reviewed under the history and medication portions of their EMR.     ROS: Negative, with the exception of above mentioned in HPI   Objective:  BP 127/72    Pulse 75    Temp 98.4 F (36.9 C) (Oral)    Ht 5\' 1"  (1.549 m)    Wt 115 lb (52.2 kg)    SpO2 100%    BMI 21.73 kg/m  Body mass index is 21.73 kg/m. Physical Exam Vitals and nursing note reviewed.  Constitutional:      General: She is not in acute distress.    Appearance: Normal appearance. She is not ill-appearing, toxic-appearing or diaphoretic.  HENT:     Head: Normocephalic and atraumatic.  Eyes:     General: No scleral icterus.       Right eye: No discharge.        Left eye: No discharge.     Extraocular Movements: Extraocular movements intact.     Conjunctiva/sclera: Conjunctivae normal.     Pupils: Pupils are equal, round, and reactive to light.  Cardiovascular:     Rate and Rhythm: Normal rate and regular rhythm.     Heart sounds: No murmur heard.   No Powell.  Pulmonary:     Effort: Pulmonary effort is normal. No  respiratory distress.     Breath sounds: Normal breath sounds. No wheezing, rhonchi or rales.  Musculoskeletal:  Cervical back: Neck supple. No tenderness.     Right lower leg: No edema.     Left lower leg: No edema.  Lymphadenopathy:     Cervical: No cervical adenopathy.  Skin:    General: Skin is warm and dry.     Coloration: Skin is not jaundiced or pale.     Findings: No erythema or rash.  Neurological:     Mental Status: She is alert and oriented to person, place, and time. Mental status is at baseline.     Motor: No weakness.     Gait: Gait normal.  Psychiatric:        Mood and Affect: Mood normal.        Behavior: Behavior normal.        Thought Content: Thought content normal.        Judgment: Judgment normal.    No results found. No results found. No results found for this or any previous visit (from the past 24 hour(s)).  Assessment/Plan: Cynthia Powell is a 76 y.o. female present for OV for  Hypothyroidism, unspecified type - tolerating levo 88 mcg.  -Lab up-to-date  Stage 3b chronic kidney disease (HCC)/vitamin D deficiency/osteoporosis -Patient was strongly encouraged to increase her hydration.  She does not drink water. -We will attempt to avoid long-term NSAIDs if possible. -GFR stable at 53 Vitamin D levels up-to-date  Hypertension/hyperlipidemia- statin declined: Stable.  Continue lisinopril 30 mg QD.  Continue fenofibrate Statin  refused Follow-up 5.5 months  Anemia, unspecified type OBT negative.  Iron panel normal - CBC w/Diff  Right carotid bruit/memory changes Noted today, with memory loss will obtain carotid Doppler studies. - US Carotid Duplex Bilateral; Future   Reviewed expectations re: course of current medical issues. Discussed self-management of symptoms. Outlined signs and symptoms indicating need for more acute intervention. Patient verbalized understanding and all questions were answered. Patient received an After-Visit  Summary.    No orders of the defined types were placed in this encounter.   No orders of the defined types were placed in this encounter.   Referral Orders  No referral(s) requested today     Note is dictated utilizing voice recognition software. Although note has been proof read prior to signing, occasional typographical errors still can be missed. If any questions arise, please do not hesitate to call for verification.   electronically signed by:  Howard Pouch, DO  [place]

## 2021-04-22 LAB — CBC WITH DIFFERENTIAL/PLATELET
Absolute Monocytes: 575 cells/uL (ref 200–950)
Basophils Absolute: 57 cells/uL (ref 0–200)
Basophils Relative: 0.7 %
Eosinophils Absolute: 308 cells/uL (ref 15–500)
Eosinophils Relative: 3.8 %
HCT: 32.4 % — ABNORMAL LOW (ref 35.0–45.0)
Hemoglobin: 10.8 g/dL — ABNORMAL LOW (ref 11.7–15.5)
Lymphs Abs: 2560 cells/uL (ref 850–3900)
MCH: 28.9 pg (ref 27.0–33.0)
MCHC: 33.3 g/dL (ref 32.0–36.0)
MCV: 86.6 fL (ref 80.0–100.0)
MPV: 10.7 fL (ref 7.5–12.5)
Monocytes Relative: 7.1 %
Neutro Abs: 4601 cells/uL (ref 1500–7800)
Neutrophils Relative %: 56.8 %
Platelets: 270 10*3/uL (ref 140–400)
RBC: 3.74 10*6/uL — ABNORMAL LOW (ref 3.80–5.10)
RDW: 12.4 % (ref 11.0–15.0)
Total Lymphocyte: 31.6 %
WBC: 8.1 10*3/uL (ref 3.8–10.8)

## 2021-04-24 ENCOUNTER — Telehealth: Payer: Self-pay | Admitting: Family Medicine

## 2021-04-24 DIAGNOSIS — K635 Polyp of colon: Secondary | ICD-10-CM | POA: Insufficient documentation

## 2021-04-24 DIAGNOSIS — D649 Anemia, unspecified: Secondary | ICD-10-CM

## 2021-04-24 HISTORY — DX: Polyp of colon: K63.5

## 2021-04-24 NOTE — Telephone Encounter (Signed)
Spoke with pt regarding labs and instructions.   

## 2021-04-24 NOTE — Telephone Encounter (Signed)
Please inform patient the following information: Her hemoglobin continues to mildly decrease again now down to 10.8.  I suspect she may have a slow loss of blood via her GI/colon. I do recommend she be evaluated by gastroenterology, since her last colonoscopy was 2014, and she had a removal of cecal polyp at that time. New found anemia can be a sign of a bleeding/changing polyp ,  precancerous polyp, or even early colon cancer.  I have referred her back to her GI team.  Documentation only: Last GI: Dr. Shana Chute, [facility] ([facility])   Impression  -Cecal polyp, status post cold biopsy polypectomy  -Sigmoid diverticulosis  -Repeat 3-5 years Specimen Collected: -- Last Resulted: --  Date: 06/23/12   Received From: [place]     DIAGNOSIS:    CECAL POLYP BIOPSY:    SESSILE SERRATED POLYP.

## 2021-04-29 ENCOUNTER — Other Ambulatory Visit: Payer: Self-pay

## 2021-04-29 ENCOUNTER — Ambulatory Visit (HOSPITAL_BASED_OUTPATIENT_CLINIC_OR_DEPARTMENT_OTHER)
Admission: RE | Admit: 2021-04-29 | Discharge: 2021-04-29 | Disposition: A | Discharge status: Home / Self Care | Payer: Medicare Other | Source: Ambulatory Visit | Attending: Family Medicine | Admitting: Family Medicine

## 2021-04-29 DIAGNOSIS — R0989 Other specified symptoms and signs involving the circulatory and respiratory systems: Secondary | ICD-10-CM | POA: Insufficient documentation

## 2021-04-29 DIAGNOSIS — R413 Other amnesia: Secondary | ICD-10-CM | POA: Diagnosis not present

## 2021-04-29 DIAGNOSIS — I6523 Occlusion and stenosis of bilateral carotid arteries: Secondary | ICD-10-CM | POA: Diagnosis not present

## 2021-05-01 ENCOUNTER — Encounter: Payer: Self-pay | Admitting: Family Medicine

## 2021-05-01 ENCOUNTER — Telehealth: Payer: Self-pay | Admitting: Family Medicine

## 2021-05-01 DIAGNOSIS — I6523 Occlusion and stenosis of bilateral carotid arteries: Secondary | ICD-10-CM | POA: Insufficient documentation

## 2021-05-01 MED ORDER — ATORVASTATIN CALCIUM 10 MG PO TABS: 10.0000 mg | ORAL_TABLET | Freq: Every day | ORAL | 3 refills | Status: DC

## 2021-05-01 NOTE — Telephone Encounter (Signed)
Spoke with pt regarding labs and instructions. Pt states she will like to start a low dose statin to [facility].  ?

## 2021-05-01 NOTE — Addendum Note (Signed)
Addended by: Howard Pouch A on: 05/01/2021 04:00 PM ? ? Modules accepted: Orders ? ?

## 2021-05-01 NOTE — Telephone Encounter (Signed)
Please inform patient her carotid artery Dopplers resulted with mild carotid artery calcifications/stenosis bilaterally. ?Nothing additionally needs to be done at this time however medical management is encouraged along with monitoring to ensure does not progress. ?This includes diet low in saturated fats, routine exercise and monitoring cholesterol levels.  The statin medications are usually encouraged since they have cardiovascular protection.  She has declined these in the past. ?Screening on carotid Dopplers every 1-2 years is encouraged. ?

## 2021-05-09 ENCOUNTER — Other Ambulatory Visit: Payer: Self-pay

## 2021-05-09 NOTE — Progress Notes (Signed)
? ?Office Visit Note ? ?Patient: Cynthia Powell             ?Date of Birth: 02-13-1946           ?MRN: [id number]             ?PCP: Howard Pouch A, DO ?Referring: Howard Pouch A, DO ?Visit Date: 05/23/2021 ?Occupation: @GUAROCC @ ? ?Subjective:  ?Discuss bone density results, pain in both hands ? ?History of Present Illness: Cynthia Powell is a 76 y.o. female with history of osteoarthritis and osteoporosis.  She states that she continues to have pain and discomfort in her bilateral hands.  She points to her right first MCP joint as the most painful joint.  She continues to have discomfort in her right trochanteric bursa.  She had been going to Dr. Ninfa Linden for left shoulder pain.  She states the shoulder joint impingement has improved.  She denies any history of oral ulcers, nasal ulcers, malar rash, photosensitivity, Raynaud's phenomenon, lymphadenopathy or rash.  There is no history of inflammatory arthritis. ? ?Activities of Daily Living:  ?Patient reports morning stiffness for 2 hours.   ?Patient Denies nocturnal pain.  ?Difficulty dressing/grooming: Denies ?Difficulty climbing stairs: Denies ?Difficulty getting out of chair: Denies ?Difficulty using hands for taps, buttons, cutlery, and/or writing: Reports ? ?Review of Systems  ?Constitutional:  Positive for fatigue.  ?HENT:  Negative for mouth sores, mouth dryness and nose dryness.   ?Eyes:  Positive for itching. Negative for pain and dryness.  ?Respiratory:  Negative for shortness of breath and difficulty breathing.   ?Cardiovascular:  Negative for chest pain and palpitations.  ?Gastrointestinal:  Negative for blood in stool, constipation and diarrhea.  ?Endocrine: Negative for increased urination.  ?Genitourinary:  Negative for difficulty urinating.  ?Musculoskeletal:  Positive for joint pain, joint pain and morning stiffness. Negative for joint swelling, myalgias, muscle tenderness and myalgias.  ?Skin:  Negative for color change, rash, redness and sensitivity  to sunlight.  ?Allergic/Immunologic: Negative for susceptible to infections.  ?Neurological:  Positive for dizziness and memory loss. Negative for numbness, headaches and weakness.  ?Hematological:  Positive for bruising/bleeding tendency. Negative for swollen glands.  ?Psychiatric/Behavioral:  Negative for depressed mood, confusion and sleep disturbance. The patient is not nervous/anxious.   ? ?PMFS History:  ?Patient Active Problem List  ? Diagnosis Date Noted  ? Carotid artery calcification, bilateral- mild 05/01/2021  ? Cecal polyp 04/24/2021  ? Anemia 02/01/2021  ? Memory changes 09/27/2020  ? Enthesopathy of hip region 04/05/2020  ? Polyarthralgia 04/05/2020  ? B12 deficiency 04/05/2020  ? CKD (chronic kidney disease) stage 3, GFR 30-59 ml/min (HCC) 11/11/2018  ? Vitamin D deficiency 10/29/2017  ? Sensory hearing loss, 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  ? Uses hearing aid 03/08/2015  ? Osteoporosis 03/08/2015  ? Hypothyroidism 03/02/2015  ? Essential hypertension 03/02/2015  ? Hyperlipidemia 03/02/2015  ? Glaucoma 06/17/2013  ? 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  ?Past Medical History:  ?Diagnosis Date  ? Abnormal Pap smear of vagina 1995,2007  ? pt had h/o abnl pap; does not desire future screening/PAP  ? Allergy   ? seasonal and food  ? Anemia recently (2023)  ? Arthritis   ? osteoarthritis  ? Benign neoplasm of colon   ? Cancer ([place]) 1995  ? cervical (cone bx)  ? Glaucoma   ? Herpes zoster 04/05/2020  ? A: given hx and physical findings as well as description of pain I do not think it is sciatica, pt also  has vesicles on LE so may be that she is starting an outbreak P:--valtrex 1000 mg po tid x 7 d --fu if worsens or no better with meds  ? History of chickenpox   ? History of shingles   ? above her right knee, gets frequently.  ? Hyperlipidemia   ? using Krill oil; refuses statin  ? Hypertension   ? Osteoporosis   ? Sensory hearing loss, bilateral 2017  ? Bilateral hearing aids. [facility]  ?  Snoring 09/27/2020  ? Spider veins of both lower extremities 09/12/2015  ? Thyroid disease 1980  ? Trigger finger   ? Wears hearing aid in both ears   ?  ?Family History  ?Problem Relation Age of Onset  ? Arthritis Mother   ? Diabetes Mother   ? Stroke Mother   ?     dementia after  ? Hypertension Mother   ? Fibromyalgia Mother   ? Varicose Veins Mother   ? Heart disease Father   ? Hypertension Father   ? Colon cancer Father   ?     mets liver  ? Pernicious anemia Father   ? Cancer Father   ? Breast cancer Sister   ?     breast cancer  ? Hearing loss Maternal Grandfather   ? Melanoma Daughter   ?     melanoma 2009  ? Diabetes Daughter   ? ADD / ADHD Daughter   ? Anxiety disorder Daughter   ? Learning disabilities Daughter   ? Lymphoma Daughter   ?     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 OCD Daughter   ? Mental illness Daughter   ? Anxiety disorder Daughter   ? Asthma Daughter   ? Cancer Daughter   ? ADD / ADHD Daughter   ? Cancer Daughter   ? Diabetes Daughter   ? Learning disabilities Daughter   ? ?Past Surgical History:  ?Procedure Laterality Date  ? BREAST SURGERY  1990  ? biopsy  ? CESAREAN SECTION    ? x2  ? COLPOSCOPY    ? EYE SURGERY  cataracts removed 2015  ? TONSILLECTOMY  1954  ? TUBAL LIGATION  1973  ? ?Social History  ? ?Social History Narrative  ? Not on file  ? ?Immunization History  ?Administered Date(s) Administered  ? Fluad Quad(high Dose 65+) 12/01/2018  ? Influenza Split 12/26/2007, 10/21/2008, 11/15/2011  ? Influenza Whole 01/01/2002  ? Influenza, Quadrivalent, Recombinant, Inj, Pf 11/21/2017  ? Influenza-Unspecified 01/31/2004, 02/10/2005, 11/20/2013, 10/20/2014, 11/22/2016, 11/29/2019, 11/13/2020  ? MMR 09/23/2012, 10/22/2012  ? PFIZER(Purple Top)SARS-COV-2 Vaccination 03/21/2019, 04/18/2019, 12/25/2019, 07/04/2020, 11/09/2020  ? Pneumococcal Conjugate-13 12/31/2011  ? Pneumococcal Polysaccharide-23 11/05/2006, 01/28/2017  ? Td 01/01/2002  ? Tdap 10/02/2018  ? Zoster Recombinat (Shingrix) 10/02/2018,  12/10/2018  ? Zoster, Live 06/25/2013  ?  ? ?Objective: ?Vital Signs: BP 137/74 (BP Location: Left Arm, Patient Position: Sitting, Cuff Size: Normal)   Pulse 73   Ht 5' 0.5" (1.537 m)   Wt 114 lb 6.4 oz (51.9 kg)   BMI 21.97 kg/m?   ? ?Physical Exam ?Vitals and nursing note reviewed.  ?Constitutional:   ?   Appearance: She is well-developed.  ?HENT:  ?   Head: Normocephalic and atraumatic.  ?Eyes:  ?   Conjunctiva/sclera: Conjunctivae normal.  ?Cardiovascular:  ?   Rate and Rhythm: Normal rate and regular rhythm.  ?   Heart sounds: Normal heart sounds.  ?Pulmonary:  ?   Effort: Pulmonary effort is normal.  ?  Breath sounds: Normal breath sounds.  ?Abdominal:  ?   General: Bowel sounds are normal.  ?   Palpations: Abdomen is soft.  ?Musculoskeletal:  ?   Cervical back: Normal range of motion.  ?Lymphadenopathy:  ?   Cervical: No cervical adenopathy.  ?Skin: ?   General: Skin is warm and dry.  ?   Capillary Refill: Capillary refill takes less than 2 seconds.  ?Neurological:  ?   Mental Status: She is alert and oriented to person, place, and time.  ?Psychiatric:     ?   Behavior: Behavior normal.  ?  ? ?Musculoskeletal Exam: C-spine was in good range of motion.  She has postural kyphosis.  Shoulder joints, elbow joints, wrist joints with good range of motion.  She had bilateral first MCP thickening.  PIP and DIP thickening with no synovitis was noted.  Hip joints with good range of motion.  She had no warmth swelling or effusion in her knee joints with full range of motion.  There was no tenderness over ankles or MTPs. ? ?CDAI Exam: ?CDAI Score: -- ?Patient Global: --; Provider Global: -- ?Swollen: --; Tender: -- ?Joint Exam 05/23/2021  ? ?No joint exam has been documented for this visit  ? ?There is currently no information documented on the homunculus. Go to the Rheumatology activity and complete the homunculus joint exam. ? ?Investigation: ?No additional findings. ? ?Imaging: ?US Carotid Duplex  Bilateral ? ?Result Date: 04/29/2021 ?CLINICAL DATA:  Right carotid bruit on physical exam EXAM: BILATERAL CAROTID DUPLEX ULTRASOUND TECHNIQUE: Pearline Cables scale imaging, color Doppler and duplex ultrasound were performed of bil

## 2021-05-10 ENCOUNTER — Ambulatory Visit (INDEPENDENT_AMBULATORY_CARE_PROVIDER_SITE_OTHER): Payer: Medicare Other | Admitting: Family Medicine

## 2021-05-10 ENCOUNTER — Encounter: Payer: Self-pay | Admitting: Family Medicine

## 2021-05-10 VITALS — BP 126/67 | HR 77 | Temp 98.4°F | Ht 61.0 in | Wt 113.0 lb

## 2021-05-10 DIAGNOSIS — E782 Mixed hyperlipidemia: Secondary | ICD-10-CM | POA: Diagnosis not present

## 2021-05-10 DIAGNOSIS — I6523 Occlusion and stenosis of bilateral carotid arteries: Secondary | ICD-10-CM | POA: Diagnosis not present

## 2021-05-10 NOTE — Patient Instructions (Signed)
? ? ?Preventing High Cholesterol ?Cholesterol is a white, waxy substance similar to fat that the human body needs to help build cells. The liver makes all the cholesterol that a person's body needs. Having high cholesterol (hypercholesterolemia) increases your risk for heart disease and stroke. Extra or excess cholesterol comes from the food that you eat. ?High cholesterol can often be prevented with diet and lifestyle changes. If you already have high cholesterol, you can control it with diet, lifestyle changes, and medicines. ?How can high cholesterol affect me? ?If you have high cholesterol, fatty deposits (plaques) may build up on the walls of your blood vessels. The blood vessels that carry blood away from your heart are called arteries. Plaques make the arteries narrower and stiffer. This in turn can: ?Restrict or block blood flow and cause blood clots to form. ?Increase your risk for heart attack and stroke. ?What can increase my risk for high cholesterol? ?This condition is more likely to develop in people who: ?Eat foods that are high in saturated fat or cholesterol. Saturated fat is mostly found in foods that come from animal sources. ?Are overweight. ?Are not getting enough exercise. ?Use products that contain nicotine or tobacco, such as cigarettes, e-cigarettes, and chewing tobacco. ?Have a family history of high cholesterol (familial hypercholesterolemia). ?What actions can I take to prevent this? ?Nutrition ? ?Eat less saturated fat. ?Avoid trans fats (partially hydrogenated oils). These are often found in margarine and in some baked goods, fried foods, and snacks bought in packages. ?Avoid precooked or cured meat, such as bacon, sausages, or meat loaves. ?Avoid foods and drinks that have added sugars. ?Eat more fruits, vegetables, and whole grains. ?Choose healthy sources of protein, such as fish, poultry, lean cuts of red meat, beans, peas, lentils, and nuts. ?Choose healthy sources of fat, such  as: ?Nuts. ?Vegetable oils, especially olive oil. ?Fish that have healthy fats, such as omega-3 fatty acids. These fish include mackerel or salmon. ?Lifestyle ?Lose weight if you are overweight. Maintaining a healthy body mass index (BMI) can help prevent or control high cholesterol. It can also lower your risk for diabetes and high blood pressure. Ask your health care provider to help you with a diet and exercise plan to lose weight safely. ?Do not use any products that contain nicotine or tobacco. These products include cigarettes, chewing tobacco, and vaping devices, such as e-cigarettes. If you need help quitting, ask your health care provider. ?Alcohol use ?Do not drink alcohol if: ?Your health care provider tells you not to drink. ?You are pregnant, may be pregnant, or are planning to become pregnant. ?If you drink alcohol: ?Limit how much you have to: ?0-1 drink a day for women. ?0-2 drinks a day for men. ?Know how much alcohol is in your drink. In the [place] one drink equals one 12 oz bottle of beer (355 mL), one 5 oz glass of wine (148 mL), or one 1? oz glass of hard liquor (44 mL). ?Activity ? ?Get enough exercise. Do exercises as told by your health care provider. ?Each week, do at least 150 minutes of exercise that takes a medium level of effort (moderate-intensity exercise). This kind of exercise: ?Makes your heart beat faster while allowing you to still be able to talk. ?Can be done in short sessions several times a day or longer sessions a few times a week. For example, on 5 days each week, you could walk fast or ride your bike 3 times a day for 10 minutes each time. ?  Medicines ?Your health care provider may recommend medicines to help lower cholesterol. This may be a medicine to lower the amount of cholesterol that your liver makes. You may need medicine if: ?Diet and lifestyle changes have not lowered your cholesterol enough. ?You have high cholesterol and other risk factors for heart disease or  stroke. ?Take over-the-counter and prescription medicines only as told by your health care provider. ?General information ?Manage your risk factors for high cholesterol. Talk with your health care provider about all your risk factors and how to lower your risk. ?Manage other conditions that you have, such as diabetes or high blood pressure (hypertension). ?Have blood tests to check your cholesterol levels at regular points in time as told by your health care provider. ?Keep all follow-up visits. This is important. ?Where to find more information ?American Heart Association: www.heart.org ?National Heart, Lung, and Blood Institute: https://wilson-eaton.com/ ?Summary ?High cholesterol increases your risk for heart disease and stroke. By keeping your cholesterol level low, you can reduce your risk for these conditions. ?High cholesterol can often be prevented with diet and lifestyle changes. ?Work with your health care provider to manage your risk factors, and have your blood tested regularly. ?This information is not intended to replace advice given to you by your health care provider. Make sure you discuss any questions you have with your health care provider. ?Document Revised: 04/18/2020 Document Reviewed: 04/18/2020 ?Elsevier Patient Education ? [place]. ? ?

## 2021-05-10 NOTE — Progress Notes (Signed)
? ? ? ?This visit occurred during the SARS-CoV-2 public health emergency.  Safety protocols were in place, including screening questions prior to the visit, additional usage of staff PPE, and extensive cleaning of exam room while observing appropriate contact time as indicated for disinfecting solutions.  ? ? ?Cynthia Powell , Jun 02, 1945, 76 y.o., female ?MRN: [id number] ?Patient Care Team  ?  Relationship Specialty Notifications Start End  ?Ma Hillock, DO PCP - General Family Medicine  03/02/15   ?Odette Fraction  Optometry  09/30/18   ?[facility]  Audiology  09/30/18   ? ? ?Chief Complaint  ?Patient presents with  ? Hyperlipidemia  ?  Pt is not fasting  ? ?  ?Subjective: Cynthia Powell is a 76 y.o. female present for patient presents to discuss her carotid artery duplex study and her hyperlipidemia.  She is looking for more detailed advice on dietary and exercise changes she could make.  She has concerns because her mother has a history of a stroke and dementia.  Her father also reportedly had a heart attack.  She states that she only eats chicken, fish, fruits and vegetables.  She does not eat fatty meats or red meats.  She does not routinely exercise.  She uses all of oil usually but recently switched to coconut oil. Body mass index is 21.35 kg/m?. ? ? ? ?Depression screen [facility] 2/9 05/10/2021 01/31/2021 04/05/2020 11/25/2019 09/30/2018  ?Decreased Interest 0 0 0 0 0  ?Down, Depressed, Hopeless 0 0 0 0 0  ?PHQ - 2 Score 0 0 0 0 0  ? ? ?Allergies  ?Allergen Reactions  ? Iodinated Contrast Media   ?  Other reaction(s): UNCONSCIOUSNESS  ? Corn-Containing Products Diarrhea  ? Fish Allergy Diarrhea  ?  Pt c/o Bluefin tuna and trout causing diarrhea.  ? Rice Diarrhea  ? ?Social History  ? ?Social History Narrative  ? Not on file  ? ?Past Medical History:  ?Diagnosis Date  ? Abnormal Pap smear of vagina 1995,2007  ? pt had h/o abnl pap; does not desire future screening/PAP  ? Allergy   ? seasonal and  food  ? Arthritis   ? osteoarthritis  ? Benign neoplasm of colon   ? Cancer ([place]) 1995  ? cervical (cone bx)  ? Glaucoma   ? Herpes zoster 04/05/2020  ? A: given hx and physical findings as well as description of pain I do not think it is sciatica, pt also has vesicles on LE so may be that she is starting an outbreak P:--valtrex 1000 mg po tid x 7 d --fu if worsens or no better with meds  ? History of chickenpox   ? History of shingles   ? above her right knee, gets frequently.  ? Hyperlipidemia   ? using Krill oil; refuses statin  ? Hypertension   ? Osteoporosis   ? Sensory hearing loss, bilateral 2017  ? Bilateral hearing aids. [facility]  ? Snoring 09/27/2020  ? Spider veins of both lower extremities 09/12/2015  ? Thyroid disease 1980  ? Trigger finger   ? Wears hearing aid in both ears   ? ?Past Surgical History:  ?Procedure Laterality Date  ? BREAST SURGERY  1990  ? biopsy  ? CESAREAN SECTION    ? x2  ? COLPOSCOPY    ? TONSILLECTOMY  1954  ? ?Family History  ?Problem Relation Age of Onset  ? Arthritis Mother   ? Diabetes Mother   ?  Stroke Mother   ?     dementia after  ? Hypertension Mother   ? Fibromyalgia Mother   ? Heart disease Father 31  ? Hypertension Father   ? Colon cancer Father   ?     mets liver  ? Pernicious anemia Father   ? Breast cancer Sister   ?     breast cancer  ? Hearing loss Maternal Grandfather   ? Melanoma Daughter   ?     melanoma 2009  ? Diabetes Daughter   ? Lymphoma Daughter   ?     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 OCD Daughter   ? Mental illness Daughter   ? Anxiety disorder Daughter   ? ?Allergies as of 05/10/2021   ? ?   Reactions  ? Iodinated Contrast Media   ? Other reaction(s): UNCONSCIOUSNESS  ? Corn-containing Products Diarrhea  ? Fish Allergy Diarrhea  ? Pt c/o Bluefin tuna and trout causing diarrhea.  ? Rice Diarrhea  ? ?  ? ?  ?Medication List  ?  ? ?  ? Accurate as of May 10, 2021  4:52 PM. If you have any questions, ask your nurse or doctor.  ?  ?  ? ?  ? ?atorvastatin 10 MG  tablet ?Commonly known as: LIPITOR ?Take 1 tablet (10 mg total) by mouth daily. ?  ?cholecalciferol 1000 units tablet ?Commonly known as: VITAMIN D ?Take 5,000 Units by mouth once a week. ?  ?CO Q 10 PO ?Take by mouth. ?  ?fenofibrate 145 MG tablet ?Commonly known as: Tricor ?Take 1 tablet (145 mg total) by mouth daily. ?  ?levothyroxine 88 MCG tablet ?Commonly known as: SYNTHROID ?Take 1 tablet (88 mcg total) by mouth daily before breakfast. ?  ?lisinopril 30 MG tablet ?Commonly known as: ZESTRIL ?Take 1 tablet (30 mg total) by mouth daily. ?  ?Lumigan 0.01 % Soln ?Generic drug: bimatoprost ?  ?Magnesium Citrate 200 MG Tabs ?  ?multivitamin-iron-minerals-folic acid chewable tablet ?Frequency:   Dosage:0.0     Instructions:  Note: ?  ?VITAMIN B 12 PO ?Take by mouth. ?  ? ?  ? ? ?All past medical history, surgical history, allergies, family history, immunizations andmedications were updated in the EMR today and reviewed under the history and medication portions of their EMR.    ? ?ROS: Negative, with the exception of above mentioned in HPI ? ?Objective:  ?BP 126/67   Pulse 77   Temp 98.4 ?F (36.9 ?C) (Oral)   Ht '5\' 1"'$  (1.549 m)   Wt 113 lb (51.3 kg)   SpO2 100%   BMI 21.35 kg/m?  ?Body mass index is 21.35 kg/m?Marland Kitchen ?Physical Exam ?Vitals and nursing note reviewed.  ?Constitutional:   ?   General: She is not in acute distress. ?   Appearance: Normal appearance. She is not ill-appearing, toxic-appearing or diaphoretic.  ?HENT:  ?   Head: Normocephalic and atraumatic.  ?Eyes:  ?   General: No scleral icterus.    ?   Right eye: No discharge.     ?   Left eye: No discharge.  ?   Extraocular Movements: Extraocular movements intact.  ?   Conjunctiva/sclera: Conjunctivae normal.  ?   Pupils: Pupils are equal, round, and reactive to light.  ?Cardiovascular:  ?   Rate and Rhythm: Normal rate and regular rhythm.  ?   Heart sounds: No murmur heard. ?  No gallop.  ?Pulmonary:  ?   Effort: Pulmonary effort is  normal. No  respiratory distress.  ?   Breath sounds: Normal breath sounds. No wheezing, rhonchi or rales.  ?Musculoskeletal:  ?   Cervical back: Neck supple. No tenderness.  ?   Right lower leg: No edema.  ?   Left lower leg: No edema.  ?Lymphadenopathy:  ?   Cervical: No cervical adenopathy.  ?Skin: ?   General: Skin is warm and dry.  ?   Coloration: Skin is not jaundiced.  ?   Findings: No rash.  ?Neurological:  ?   Mental Status: She is alert and oriented to person, place, and time. Mental status is at baseline.  ?   Motor: No weakness.  ?   Gait: Gait normal.  ?Psychiatric:     ?   Mood and Affect: Mood normal.     ?   Behavior: Behavior normal.     ?   Thought Content: Thought content normal.     ?   Judgment: Judgment normal.  ? ? ?No results found. ?No results found. ?No results found for this or any previous visit (from the past 24 hour(s)). ? ?Assessment/Plan: ?Cynthia Powell is a 76 y.o. female present for OV for  ?Hypertension/hyperlipidemia ?Stable  ?Continue lisinopril 30 mg QD.  ?Continue fenofibrate ?She has now started Lipitor 10 mg nightly and tolerating ?Carotid Dopplers 04/29/2021: 1-49% bilateral carotid stenosis.  Repeat studies every 2 years recommended. ?Discussed dietary modifications in detail.  Discussed exercise at least 150 minutes a week. ?AVS on cholesterol provided. ?Follow-up 5.5 months ? ?Reviewed expectations re: course of current medical issues. ?Discussed self-management of symptoms. ?Outlined signs and symptoms indicating need for more acute intervention. ?Patient verbalized understanding and all questions were answered. ?Patient received an After-Visit Summary. ? ? ?No orders of the defined types were placed in this encounter. ? ? ?No orders of the defined types were placed in this encounter. ? ? ?Referral Orders  ?No referral(s) requested today  ? ? ? ?Note is dictated utilizing voice recognition software. Although note has been proof read prior to signing, occasional typographical errors  still can be missed. If any questions arise, please do not hesitate to call for verification.  ? ?electronically signed by: ? ?Howard Pouch, DO  ?[facility] [place] ? ? ? ? ?

## 2021-05-12 ENCOUNTER — Ambulatory Visit (INDEPENDENT_AMBULATORY_CARE_PROVIDER_SITE_OTHER): Payer: Medicare Other

## 2021-05-12 ENCOUNTER — Other Ambulatory Visit: Payer: Self-pay

## 2021-05-12 DIAGNOSIS — Z0000 Encounter for general adult medical examination without abnormal findings: Secondary | ICD-10-CM | POA: Diagnosis not present

## 2021-05-12 NOTE — Progress Notes (Signed)
? ?Subjective:  ? Cynthia Powell is a 76 y.o. female who presents for Medicare Annual (Subsequent) preventive examination.I connected with  GRISEL BLUMENSTOCK on 05/12/21 by a audio enabled telemedicine application and verified that I am speaking with the correct person using two identifiers. ? ?Patient Location: Home ? ?Provider Location: Office/Clinic ? ?I discussed the limitations of evaluation and management by telemedicine. The patient expressed understanding and agreed to proceed. ? ? ?Review of Systems    ?Defer to PCP ?Cardiac Risk Factors include: advanced age (>86mn, >>9women);dyslipidemia ? ?   ?Objective:  ?  ?There were no vitals filed for this visit. ?There is no height or weight on file to calculate BMI. ? ?Advanced Directives 05/12/2021 11/25/2019 09/30/2018 01/28/2017  ?Does Patient Have a Medical Advance Directive? Yes Yes Yes Yes  ?Type of AParamedicof AFriendshipLiving will HLibertyLiving will [place]em[place] TerraceLiving will [place]Living will  ?Does patient want to make changes to medical advance directive? No - Patient declined - - -  ?Copy of HArgylein Chart? No - copy requested No - copy requested Yes - validated most recent copy scanned in chart (See row information) No - copy requested  ? ? ?Current Medications (verified) ?Outpatient Encounter Medications as of 05/12/2021  ?Medication Sig  ? atorvastatin (LIPITOR) 10 MG tablet Take 1 tablet (10 mg total) by mouth daily.  ? cholecalciferol (VITAMIN D) 1000 units tablet Take 5,000 Units by mouth once a week.   ? Coenzyme Q10 (CO Q 10 PO) Take by mouth.  ? Cyanocobalamin (VITAMIN B 12 PO) Take by mouth.  ? fenofibrate (TRICOR) 145 MG tablet Take 1 tablet (145 mg total) by mouth daily.  ? levothyroxine (SYNTHROID) 88 MCG tablet Take 1 tablet (88 mcg total) by mouth daily before breakfast.  ? lisinopril (ZESTRIL) 30 MG tablet Take 1 tablet (30 mg total) by  mouth daily.  ? LUMIGAN 0.01 % SOLN   ? Magnesium Citrate 200 MG TABS   ? multivitamin-iron-minerals-folic acid (CENTRUM) chewable tablet Frequency:   Dosage:0.0     Instructions:  Note:  ? ?No facility-administered encounter medications on file as of 05/12/2021.  ? ? ?Allergies (verified) ?Iodinated contrast media, Corn-containing products, Fish allergy, and Rice  ? ?History: ?Past Medical History:  ?Diagnosis Date  ? Abnormal Pap smear of vagina 1995,2007  ? pt had h/o abnl pap; does not desire future screening/PAP  ? Allergy   ? seasonal and food  ? Anemia recently (2023)  ? Arthritis   ? osteoarthritis  ? Benign neoplasm of colon   ? [place]  ? cervical (cone bx)  ? Glaucoma   ? Herpes zoster 04/05/2020  ? A: given hx and physical findings as well as description of pain I do not think it is sciatica, pt also has vesicles on LE so may be that she is starting an outbreak P:--valtrex 1000 mg po tid x 7 d --fu if worsens or no better with meds  ? History of chickenpox   ? History of shingles   ? above her right knee, gets frequently.  ? Hyperlipidemia   ? using Krill oil; refuses statin  ? Hypertension   ? Osteoporosis   ? Sensory hearing loss, bilateral 2017  ? Bilateral hearing aids. [facility]  ? Snoring 09/27/2020  ? Spider veins of both lower extremities 09/12/2015  ? Thyroid disease 1980  ? Trigger finger   ?  Wears hearing aid in both ears   ? ?Past Surgical History:  ?Procedure Laterality Date  ? BREAST SURGERY  1990  ? biopsy  ? CESAREAN SECTION    ? x2  ? COLPOSCOPY    ? EYE SURGERY  cataracts removed 2015  ? TONSILLECTOMY  1954  ? TUBAL LIGATION  1973  ? ?Family History  ?Problem Relation Age of Onset  ? Arthritis Mother   ? Diabetes Mother   ? Stroke Mother   ?     dementia after  ? Hypertension Mother   ? Fibromyalgia Mother   ? Varicose Veins Mother   ? Heart disease Father   ? Hypertension Father   ? Colon cancer Father   ?     mets liver  ? Pernicious anemia Father   ? Cancer Father   ?  Breast cancer Sister   ?     breast cancer  ? Hearing loss Maternal Grandfather   ? Melanoma Daughter   ?     melanoma 2009  ? Diabetes Daughter   ? ADD / ADHD Daughter   ? Anxiety disorder Daughter   ? Learning disabilities Daughter   ? Lymphoma Daughter   ?     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 OCD Daughter   ? Mental illness Daughter   ? Anxiety disorder Daughter   ? Asthma Daughter   ? Cancer Daughter   ? ADD / ADHD Daughter   ? Cancer Daughter   ? Diabetes Daughter   ? Learning disabilities Daughter   ? ?Social History  ? ?Socioeconomic History  ? Marital status: Married  ?  Spouse name: Not on file  ? Number of children: Not on file  ? Years of education: Not on file  ? Highest education level: Bachelor's degree (e.g., BA, AB, BS)  ?Occupational History  ? Not on file  ?Tobacco Use  ? Smoking status: Never  ?  Passive exposure: Yes  ? Smokeless tobacco: Never  ? Tobacco comments:  ?  Parents smoked a lot, I tried but quit when young  ?Vaping Use  ? Vaping Use: Never used  ?Substance and Sexual Activity  ? Alcohol use: Not Currently  ?  Comment: rarely glass of wine a year but not for several years  ? Drug use: Never  ? Sexual activity: Yes  ?  Birth control/protection: None  ?  Comment: not needed  ?Other Topics Concern  ? Not on file  ?Social History Narrative  ? Not on file  ? ?Social Determinants of Health  ? ?Financial Resource Strain: Low Risk   ? Difficulty of Paying Living Expenses: Not hard at all  ?Food Insecurity: No Food Insecurity  ? Worried About Charity fundraiser in the Last Year: Never true  ? Ran Out of Food in the Last Year: Never true  ?Transportation Needs: No Transportation Needs  ? Lack of Transportation (Medical): No  ? Lack of Transportation (Non-Medical): No  ?Physical Activity: Inactive  ? Days of Exercise per Week: 0 days  ? Minutes of Exercise per Session: 0 min  ?Stress: No Stress Concern Present  ? Feeling of Stress : Only a little  ?Social Connections: Socially Integrated  ?  Frequency of Communication with Friends and Family: More than three times a week  ? Frequency of Social Gatherings with Friends and Family: More than three times a week  ? Attends Religious Services: 1 to 4 times per year  ? Active  Member of Clubs or Organizations: Yes  ? Attends Archivist Meetings: 1 to 4 times per year  ? Marital Status: Married  ? ? ?Tobacco Counseling ?Counseling given: Not Answered ?Tobacco comments: Parents smoked a lot, I tried but quit when young ? ? ?Clinical Intake: ? ?Pre-visit preparation completed: Yes ? ?Pain : No/denies pain ? ?  ? ?Nutritional Risks: None ?Diabetes: No ? ?How often do you need to have someone help you when you read instructions, pamphlets, or other written materials from your doctor or pharmacy?: 1 - Never ?What is the last grade level you completed in school?: college ? ?Diabetic?No ? ?Interpreter Needed?: No ? ?  ? ? ?Activities of Daily Living ?In your present state of health, do you have any difficulty performing the following activities: 05/12/2021  ?Hearing? Y  ?Vision? N  ?Difficulty concentrating or making decisions? Y  ?Walking or climbing stairs? N  ?Dressing or bathing? N  ?Doing errands, shopping? N  ?Preparing Food and eating ? N  ?Using the Toilet? N  ?In the past six months, have you accidently leaked urine? Y  ?Do you have problems with loss of bowel control? N  ?Managing your Medications? N  ?Managing your Finances? N  ?Housekeeping or managing your Housekeeping? N  ?Some recent data might be hidden  ? ? ?Patient Care Team: ?Ma Hillock, DO as PCP - General (Family Medicine) ?Odette Fraction [facility]) ?[facility] (Audiology) ? ?Indicate any recent Medical Services you may have received from other than [facility] providers in the past year (date may be approximate). ? ?   ?Assessment:  ? This is a routine wellness examination for Kadience. ? ?Hearing/Vision screen ?No results found. ? ?Dietary issues and exercise  activities discussed: ?Current Exercise Habits: The patient does not participate in regular exercise at present, Exercise limited by: None identified ? ? Goals Addressed   ?None ?  ?Depression Screen ?PHQ 2/9 Scores

## 2021-05-16 DIAGNOSIS — Z800 Family history of malignant neoplasm of digestive organs: Secondary | ICD-10-CM

## 2021-05-16 DIAGNOSIS — D649 Anemia, unspecified: Secondary | ICD-10-CM | POA: Diagnosis not present

## 2021-05-16 DIAGNOSIS — Z860100 Personal history of colon polyps, unspecified: Secondary | ICD-10-CM

## 2021-05-16 HISTORY — DX: Personal history of colon polyps, unspecified: Z86.0100

## 2021-05-16 HISTORY — DX: Family history of malignant neoplasm of digestive organs: Z80.0

## 2021-05-16 HISTORY — PX: COLONOSCOPY: SHX174

## 2021-05-16 HISTORY — DX: Personal history of colonic polyps: Z86.010

## 2021-05-22 ENCOUNTER — Ambulatory Visit: Payer: Medicare Other | Admitting: Rheumatology

## 2021-05-23 ENCOUNTER — Encounter: Payer: Self-pay | Admitting: Rheumatology

## 2021-05-23 ENCOUNTER — Other Ambulatory Visit: Payer: Self-pay

## 2021-05-23 ENCOUNTER — Ambulatory Visit (INDEPENDENT_AMBULATORY_CARE_PROVIDER_SITE_OTHER): Payer: Medicare Other | Admitting: Rheumatology

## 2021-05-23 VITALS — BP 137/74 | HR 73 | Ht 60.5 in | Wt 114.4 lb

## 2021-05-23 DIAGNOSIS — I8393 Asymptomatic varicose veins of bilateral lower extremities: Secondary | ICD-10-CM

## 2021-05-23 DIAGNOSIS — F439 Reaction to severe stress, unspecified: Secondary | ICD-10-CM | POA: Diagnosis not present

## 2021-05-23 DIAGNOSIS — E538 Deficiency of other specified B group vitamins: Secondary | ICD-10-CM

## 2021-05-23 DIAGNOSIS — Z8639 Personal history of other endocrine, nutritional and metabolic disease: Secondary | ICD-10-CM

## 2021-05-23 DIAGNOSIS — E559 Vitamin D deficiency, unspecified: Secondary | ICD-10-CM

## 2021-05-23 DIAGNOSIS — I10 Essential (primary) hypertension: Secondary | ICD-10-CM | POA: Diagnosis not present

## 2021-05-23 DIAGNOSIS — N1832 Chronic kidney disease, stage 3b: Secondary | ICD-10-CM

## 2021-05-23 DIAGNOSIS — M19041 Primary osteoarthritis, right hand: Secondary | ICD-10-CM

## 2021-05-23 DIAGNOSIS — R7689 Other specified abnormal immunological findings in serum: Secondary | ICD-10-CM

## 2021-05-23 DIAGNOSIS — H903 Sensorineural hearing loss, bilateral: Secondary | ICD-10-CM | POA: Diagnosis not present

## 2021-05-23 DIAGNOSIS — M25551 Pain in right hip: Secondary | ICD-10-CM

## 2021-05-23 DIAGNOSIS — Z8669 Personal history of other diseases of the nervous system and sense organs: Secondary | ICD-10-CM | POA: Diagnosis not present

## 2021-05-23 DIAGNOSIS — R5383 Other fatigue: Secondary | ICD-10-CM | POA: Diagnosis not present

## 2021-05-23 DIAGNOSIS — R768 Other specified abnormal immunological findings in serum: Secondary | ICD-10-CM | POA: Diagnosis not present

## 2021-05-23 DIAGNOSIS — M7542 Impingement syndrome of left shoulder: Secondary | ICD-10-CM | POA: Diagnosis not present

## 2021-05-23 DIAGNOSIS — M810 Age-related osteoporosis without current pathological fracture: Secondary | ICD-10-CM

## 2021-05-23 DIAGNOSIS — I6523 Occlusion and stenosis of bilateral carotid arteries: Secondary | ICD-10-CM

## 2021-05-23 DIAGNOSIS — M19042 Primary osteoarthritis, left hand: Secondary | ICD-10-CM

## 2021-05-23 DIAGNOSIS — L280 Lichen simplex chronicus: Secondary | ICD-10-CM

## 2021-05-23 DIAGNOSIS — M7061 Trochanteric bursitis, right hip: Secondary | ICD-10-CM | POA: Diagnosis not present

## 2021-05-23 NOTE — Patient Instructions (Signed)
Alendronate Tablets What is this medication? ALENDRONATE (a LEN droe nate) prevents and treats osteoporosis. It may also be used to treat Paget disease of the bone. It works by making your bones stronger and less likely to break (fracture). It belongs to a group of medications called bisphosphonates. This medicine may be used for other purposes; ask your health care provider or pharmacist if you have questions. COMMON BRAND NAME(S): Fosamax What should I tell my care team before I take this medication? They need to know if you have any of these conditions: Bleeding disorder Cancer Dental disease Difficulty swallowing Infection (fever, chills, cough, sore throat, pain or trouble passing urine) Kidney disease Low levels of calcium or other minerals in the blood Low red blood cell counts Receiving steroids like dexamethasone or prednisone Stomach or intestine problems Trouble sitting or standing for 30 minutes An unusual or allergic reaction to alendronate, other medications, foods, dyes or preservatives Pregnant or trying to get pregnant Breast-feeding How should I use this medication? Take this medication by mouth with a full glass of water. Take it as directed on the prescription label at the same time every day. Take the dose right after waking up. Do not eat or drink anything before taking it. Do not take it with any other drink except water. Do not chew or crush the tablet. After taking it, do not eat breakfast, drink, or take any other medications or vitamins for at least 30 minutes. Sit or stand up for at least 30 minutes after you take it. Do not lie down. Keep taking it unless your care team tells you to stop. A special MedGuide will be given to you by the pharmacist with each prescription and refill. Be sure to read this information carefully each time. Talk to your care team about the use of this medication in children. Special care may be needed. Overdosage: If you think you have  taken too much of this medicine contact a poison control center or emergency room at once. NOTE: This medicine is only for you. Do not share this medicine with others. What if I miss a dose? If you take your medication once a day, skip it. Take your next dose at the scheduled time the next morning. Do not take two doses on the same day. If you take your medication once a week, take the missed dose on the morning after you remember. Do not take two doses on the same day. What may interact with this medication? Aluminum hydroxide Antacids Aspirin Calcium supplements Medications for inflammation like ibuprofen, naproxen, and others Iron supplements Magnesium supplements Vitamins with minerals This list may not describe all possible interactions. Give your health care provider a list of all the medicines, herbs, non-prescription drugs, or dietary supplements you use. Also tell them if you smoke, drink alcohol, or use illegal drugs. Some items may interact with your medicine. What should I watch for while using this medication? Visit your care team for regular checks on your progress. It may be some time before you see the benefit from this medication. Some people who take this medication have severe bone, joint, or muscle pain. This medication may also increase your risk for jaw problems or a broken thigh bone. Tell your care team right away if you have severe pain in your jaw, bones, joints, or muscles. Tell you care team if you have any pain that does not go away or that gets worse. Tell your dentist and dental surgeon that you are   taking this medication. You should not have major dental surgery while on this medication. See your dentist to have a dental exam and fix any dental problems before starting this medication. Take good care of your teeth while on this medication. Make sure you see your dentist for regular follow-up appointments. You should make sure you get enough calcium and vitamin D  while you are taking this medication. Discuss the foods you eat and the vitamins you take with your care team. You may need blood work done while you are taking this medication. What side effects may I notice from receiving this medication? Side effects that you should report to your care team as soon as possible: Allergic reactions--skin rash, itching, hives, swelling of the face, lips, tongue, or throat Low calcium level--muscle pain or cramps, confusion, tingling, or numbness in the hands or feet Osteonecrosis of the jaw--pain, swelling, or redness in the mouth, numbness of the jaw, poor healing after dental work, unusual discharge from the mouth, visible bones in the mouth Pain or trouble swallowing Severe bone, joint, or muscle pain Stomach bleeding--bloody or black, tar-like stools, vomiting blood or brown material that looks like coffee grounds Side effects that usually do not require medical attention (report to your care team if they continue or are bothersome): Constipation Diarrhea Nausea Stomach pain This list may not describe all possible side effects. Call your doctor for medical advice about side effects. You may report side effects to FDA at 1-800-FDA-1088. Where should I keep my medication? Keep out of the reach of children and pets. Store at room temperature between 15 and 30 degrees C (59 and 86 degrees F). Throw away any unused medication after the expiration date. NOTE: This sheet is a summary. It may not cover all possible information. If you have questions about this medicine, talk to your doctor, pharmacist, or health care provider.  2022 Elsevier/Gold Standard (2020-02-25 00:00:00)  

## 2021-05-29 DIAGNOSIS — M7061 Trochanteric bursitis, right hip: Secondary | ICD-10-CM | POA: Diagnosis not present

## 2021-05-31 DIAGNOSIS — M7061 Trochanteric bursitis, right hip: Secondary | ICD-10-CM | POA: Diagnosis not present

## 2021-06-06 DIAGNOSIS — M7061 Trochanteric bursitis, right hip: Secondary | ICD-10-CM | POA: Diagnosis not present

## 2021-06-12 ENCOUNTER — Encounter: Payer: Self-pay | Admitting: Family Medicine

## 2021-06-12 ENCOUNTER — Ambulatory Visit (INDEPENDENT_AMBULATORY_CARE_PROVIDER_SITE_OTHER): Payer: Medicare Other | Admitting: Family Medicine

## 2021-06-12 VITALS — BP 116/69 | HR 76 | Temp 97.6°F | Ht 60.5 in | Wt 114.0 lb

## 2021-06-12 DIAGNOSIS — J329 Chronic sinusitis, unspecified: Secondary | ICD-10-CM | POA: Diagnosis not present

## 2021-06-12 DIAGNOSIS — B9689 Other specified bacterial agents as the cause of diseases classified elsewhere: Secondary | ICD-10-CM

## 2021-06-12 DIAGNOSIS — J029 Acute pharyngitis, unspecified: Secondary | ICD-10-CM

## 2021-06-12 DIAGNOSIS — R051 Acute cough: Secondary | ICD-10-CM

## 2021-06-12 LAB — POCT RAPID STREP A (OFFICE): Rapid Strep A Screen: NEGATIVE

## 2021-06-12 MED ORDER — FLUTICASONE PROPIONATE 50 MCG/ACT NA SUSP
2.0000 | Freq: Every day | NASAL | 6 refills | Status: DC
Start: 2021-06-12 — End: 2021-10-11

## 2021-06-12 MED ORDER — AMOXICILLIN-POT CLAVULANATE 875-125 MG PO TABS: 1.0000 | ORAL_TABLET | Freq: Two times a day (BID) | ORAL | 0 refills | Status: DC

## 2021-06-12 MED ORDER — IPRATROPIUM BROMIDE 0.06 % NA SOLN: 2.0000 | Freq: Four times a day (QID) | NASAL | 2 refills | Status: DC

## 2021-06-12 NOTE — Patient Instructions (Addendum)
?Start allegra ?Continue mucinex DM ? ?Prescribed: augmentin antibiotic and nasal sprays.  ? ? ?Sinus Infection, Adult ?A sinus infection, also called sinusitis, is inflammation of your sinuses. Sinuses are hollow spaces in the bones around your face. Your sinuses are located: ?Around your eyes. ?In the middle of your forehead. ?Behind your nose. ?In your cheekbones. ?Mucus normally drains out of your sinuses. When your nasal tissues become inflamed or swollen, mucus can become trapped or blocked. This allows bacteria, viruses, and fungi to grow, which leads to infection. Most infections of the sinuses are caused by a virus. ?A sinus infection can develop quickly. It can last for up to 4 weeks (acute) or for more than 12 weeks (chronic). A sinus infection often develops after a cold. ?What are the causes? ?This condition is caused by anything that creates swelling in the sinuses or stops mucus from draining. This includes: ?Allergies. ?Asthma. ?Infection from bacteria or viruses. ?Deformities or blockages in your nose or sinuses. ?Abnormal growths in the nose (nasal polyps). ?Pollutants, such as chemicals or irritants in the air. ?Infection from fungi. This is rare. ?What increases the risk? ?You are more likely to develop this condition if you: ?Have a weak body defense system (immune system). ?Do a lot of swimming or diving. ?Overuse nasal sprays. ?Smoke. ?What are the signs or symptoms? ?The main symptoms of this condition are pain and a feeling of pressure around the affected sinuses. Other symptoms include: ?Stuffy nose or congestion that makes it difficult to breathe through your nose. ?Thick yellow or greenish drainage from your nose. ?Tenderness, swelling, and warmth over the affected sinuses. ?A cough that may get worse at night. ?Decreased sense of smell and taste. ?Extra mucus that collects in the throat or the back of the nose (postnasal drip) causing a sore throat or bad breath. ?Tiredness  (fatigue). ?Fever. ?How is this diagnosed? ?This condition is diagnosed based on: ?Your symptoms. ?Your medical history. ?A physical exam. ?Tests to find out if your condition is acute or chronic. This may include: ?Checking your nose for nasal polyps. ?Viewing your sinuses using a device that has a light (endoscope). ?Testing for allergies or bacteria. ?Imaging tests, such as an MRI or CT scan. ?In rare cases, a bone biopsy may be done to rule out more serious types of fungal sinus disease. ?How is this treated? ?Treatment for a sinus infection depends on the cause and whether your condition is chronic or acute. ?If caused by a virus, your symptoms should go away on their own within 10 days. You may be given medicines to relieve symptoms. They include: ?Medicines that shrink swollen nasal passages (decongestants). ?A spray that eases inflammation of the nostrils (topical intranasal corticosteroids). ?Rinses that help get rid of thick mucus in your nose (nasal saline washes). ?Medicines that treat allergies (antihistamines). ?Over-the-counter pain relievers. ?If caused by bacteria, your health care provider may recommend waiting to see if your symptoms improve. Most bacterial infections will get better without antibiotic medicine. You may be given antibiotics if you have: ?A severe infection. ?A weak immune system. ?If caused by narrow nasal passages or nasal polyps, surgery may be needed. ?Follow these instructions at home: ?Medicines ?Take, use, or apply over-the-counter and prescription medicines only as told by your health care provider. These may include nasal sprays. ?If you were prescribed an antibiotic medicine, take it as told by your health care provider. Do not stop taking the antibiotic even if you start to feel better. ?Hydrate and  humidify ? ?Drink enough fluid to keep your urine pale yellow. Staying hydrated will help to thin your mucus. ?Use a cool mist humidifier to keep the humidity level in your  home above 50%. ?Inhale steam for 10-15 minutes, 3-4 times a day, or as told by your health care provider. You can do this in the bathroom while a hot shower is running. ?Limit your exposure to cool or dry air. ?Rest ?Rest as much as possible. ?Sleep with your head raised (elevated). ?Make sure you get enough sleep each night. ?General instructions ? ?Apply a warm, moist washcloth to your face 3-4 times a day or as told by your health care provider. This will help with discomfort. ?Use nasal saline washes as often as told by your health care provider. ?Wash your hands often with soap and water to reduce your exposure to germs. If soap and water are not available, use hand sanitizer. ?Do not smoke. Avoid being around people who are smoking (secondhand smoke). ?Keep all follow-up visits. This is important. ?Contact a health care provider if: ?You have a fever. ?Your symptoms get worse. ?Your symptoms do not improve within 10 days. ?Get help right away if: ?You have a severe headache. ?You have persistent vomiting. ?You have severe pain or swelling around your face or eyes. ?You have vision problems. ?You develop confusion. ?Your neck is stiff. ?You have trouble breathing. ?These symptoms may be an emergency. Get help right away. Call 911. ?Do not wait to see if the symptoms will go away. ?Do not drive yourself to the hospital. ?Summary ?A sinus infection is soreness and inflammation of your sinuses. Sinuses are hollow spaces in the bones around your face. ?This condition is caused by nasal tissues that become inflamed or swollen. The swelling traps or blocks the flow of mucus. This allows bacteria, viruses, and fungi to grow, which leads to infection. ?If you were prescribed an antibiotic medicine, take it as told by your health care provider. Do not stop taking the antibiotic even if you start to feel better. ?Keep all follow-up visits. This is important. ?This information is not intended to replace advice given to  you by your health care provider. Make sure you discuss any questions you have with your health care provider. ?Document Revised: 01/17/2021 Document Reviewed: 01/17/2021 ?Elsevier Patient Education ? [place]. ? ?

## 2021-06-12 NOTE — Progress Notes (Signed)
? ? ? ?This visit occurred during the SARS-CoV-2 public health emergency.  Safety protocols were in place, including screening questions prior to the visit, additional usage of staff PPE, and extensive cleaning of exam room while observing appropriate contact time as indicated for disinfecting solutions.  ? ? ?Cynthia Powell , June 24, 1945, 76 y.o., female ?MRN: [id number] ?Patient Care Team  ?  Relationship Specialty Notifications Start End  ?Ma Hillock, DO PCP - General Family Medicine  03/02/15   ?Odette Fraction  Optometry  09/30/18   ?[facility]  Audiology  09/30/18   ? ? ?Chief Complaint  ?Patient presents with  ? Cough  ?  Pt c/o cough, post nasal drip, nasal congestion, sore throat x 2 week;   ? ?  ?Subjective: Pt presents for an OV with complaints of cough and congestion  of 1 week  duration.  Associated symptoms include sinus pressure, PND, nasal congestion, sore throat ?Pt has tried sinusex spray,  mucinex dm, halls to ease their symptoms.  ? ? ?  05/12/2021  ?  5:50 PM 05/10/2021  ?  1:57 PM 01/31/2021  ?  1:50 PM 04/05/2020  ?  1:03 PM 11/25/2019  ?  1:00 PM  ?Depression screen PHQ 2/9  ?Decreased Interest 0 0 0 0 0  ?Down, Depressed, Hopeless 0 0 0 0 0  ?PHQ - 2 Score 0 0 0 0 0  ? ? ?Allergies  ?Allergen Reactions  ? Iodinated Contrast Media   ?  Other reaction(s): UNCONSCIOUSNESS  ? Spinach   ? Corn-Containing Products Diarrhea  ? Fish Allergy Diarrhea  ?  Pt c/o Bluefin tuna and trout causing diarrhea.  ? Rice Diarrhea  ? ?Social History  ? ?Social History Narrative  ? Not on file  ? ?Past Medical History:  ?Diagnosis Date  ? Abnormal Pap smear of vagina 1995,2007  ? pt had h/o abnl pap; does not desire future screening/PAP  ? Allergy   ? seasonal and food  ? Anemia recently (2023)  ? Arthritis   ? osteoarthritis  ? Benign neoplasm of colon   ? Cancer ([place]) 1995  ? cervical (cone bx)  ? Glaucoma   ? Herpes zoster 04/05/2020  ? A: given hx and physical findings as well as description  of pain I do not think it is sciatica, pt also has vesicles on LE so may be that she is starting an outbreak P:--valtrex 1000 mg po tid x 7 d --fu if worsens or no better with meds  ? History of chickenpox   ? History of shingles   ? above her right knee, gets frequently.  ? Hyperlipidemia   ? using Krill oil; refuses statin  ? Hypertension   ? Osteoporosis   ? Sensory hearing loss, bilateral 2017  ? Bilateral hearing aids. [facility]  ? Snoring 09/27/2020  ? Spider veins of both lower extremities 09/12/2015  ? Thyroid disease 1980  ? Trigger finger   ? Wears hearing aid in both ears   ? ?Past Surgical History:  ?Procedure Laterality Date  ? BREAST SURGERY  1990  ? biopsy  ? CESAREAN SECTION    ? x2  ? COLPOSCOPY    ? EYE SURGERY  cataracts removed 2015  ? TONSILLECTOMY  1954  ? TUBAL LIGATION  1973  ? ?Family History  ?Problem Relation Age of Onset  ? Arthritis Mother   ? Diabetes Mother   ? Stroke Mother   ?  dementia after  ? Hypertension Mother   ? Fibromyalgia Mother   ? Varicose Veins Mother   ? Heart disease Father   ? Hypertension Father   ? Colon cancer Father   ?     mets liver  ? Pernicious anemia Father   ? Cancer Father   ? Breast cancer Sister   ?     breast cancer  ? Hearing loss Maternal Grandfather   ? Melanoma Daughter   ?     melanoma 2009  ? Diabetes Daughter   ? ADD / ADHD Daughter   ? Anxiety disorder Daughter   ? Learning disabilities Daughter   ? Lymphoma Daughter   ?     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 OCD Daughter   ? Mental illness Daughter   ? Anxiety disorder Daughter   ? Asthma Daughter   ? Cancer Daughter   ? ADD / ADHD Daughter   ? Cancer Daughter   ? Diabetes Daughter   ? Learning disabilities Daughter   ? ?Allergies as of 06/12/2021   ? ?   Reactions  ? Iodinated Contrast Media   ? Other reaction(s): UNCONSCIOUSNESS  ? Spinach   ? Corn-containing Products Diarrhea  ? Fish Allergy Diarrhea  ? Pt c/o Bluefin tuna and trout causing diarrhea.  ? Rice Diarrhea  ? ?  ? ?  ?Medication  List  ?  ? ?  ? Accurate as of June 12, 2021  2:14 PM. If you have any questions, ask your nurse or doctor.  ?  ?  ? ?  ? ?amoxicillin-clavulanate 875-125 MG tablet ?Commonly known as: AUGMENTIN ?Take 1 tablet by mouth 2 (two) times daily. ?Started by: Howard Pouch, DO ?  ?atorvastatin 10 MG tablet ?Commonly known as: LIPITOR ?Take 1 tablet (10 mg total) by mouth daily. ?  ?cholecalciferol 1000 units tablet ?Commonly known as: VITAMIN D ?Take 5,000 Units by mouth once a week. ?  ?CO Q 10 PO ?Take by mouth. ?  ?fenofibrate 145 MG tablet ?Commonly known as: Tricor ?Take 1 tablet (145 mg total) by mouth daily. ?  ?fluticasone 50 MCG/ACT nasal spray ?Commonly known as: FLONASE ?Place 2 sprays into both nostrils daily. ?Started by: Howard Pouch, DO ?  ?ipratropium 0.06 % nasal spray ?Commonly known as: ATROVENT ?Place 2 sprays into both nostrils 4 (four) times daily. ?Started by: Howard Pouch, DO ?  ?levothyroxine 88 MCG tablet ?Commonly known as: SYNTHROID ?Take 1 tablet (88 mcg total) by mouth daily before breakfast. ?  ?lisinopril 30 MG tablet ?Commonly known as: ZESTRIL ?Take 1 tablet (30 mg total) by mouth daily. ?  ?Lumigan 0.01 % Soln ?Generic drug: bimatoprost ?  ?Magnesium Citrate 200 MG Tabs ?  ?multivitamin-iron-minerals-folic acid chewable tablet ?Frequency:   Dosage:0.0     Instructions:  Note: ?  ?QC TUMERIC COMPLEX PO ?Take by mouth. ?  ?VITAMIN B 12 PO ?Take by mouth. ?  ? ?  ? ? ?All past medical history, surgical history, allergies, family history, immunizations andmedications were updated in the EMR today and reviewed under the history and medication portions of their EMR.    ? ?Review of Systems  ?Constitutional:  Positive for malaise/fatigue. Negative for chills and fever.  ?HENT:  Positive for congestion, sinus pain and sore throat. Negative for ear discharge, ear pain and nosebleeds.   ?Eyes:  Negative for pain, discharge and redness.  ?Respiratory:  Positive for cough and sputum production.  Negative for shortness of breath,  wheezing and stridor.   ?Gastrointestinal:  Negative for abdominal pain, diarrhea, nausea and vomiting.  ?Skin:  Negative for rash.  ?Neurological:  Negative for headaches.  ?Negative, with the exception of above mentioned in HPI ? ? ?Objective:  ?BP 116/69   Pulse 76   Temp 97.6 ?F (36.4 ?C) (Oral)   Ht 5' 0.5" (1.537 m)   Wt 114 lb (51.7 kg)   SpO2 94%   BMI 21.90 kg/m?  ?Body mass index is 21.9 kg/m?Marland Kitchen ?Physical Exam ?Vitals and nursing note reviewed.  ?Constitutional:   ?   General: She is not in acute distress. ?   Appearance: Normal appearance. She is normal weight. She is not ill-appearing or toxic-appearing.  ?Eyes:  ?   Extraocular Movements: Extraocular movements intact.  ?   Conjunctiva/sclera: Conjunctivae normal.  ?   Pupils: Pupils are equal, round, and reactive to light.  ?Neurological:  ?   Mental Status: She is alert and oriented to person, place, and time. Mental status is at baseline.  ?Psychiatric:     ?   Mood and Affect: Mood normal.     ?   Behavior: Behavior normal.     ?   Thought Content: Thought content normal.     ?   Judgment: Judgment normal.  ? ? ?No results found. ?No results found. ?Results for orders placed or performed in visit on 06/12/21 (from the past 24 hour(s))  ?POCT rapid strep A     Status: None  ? Collection Time: 06/12/21  2:12 PM  ?Result Value Ref Range  ? Rapid Strep A Screen Negative Negative  ? ? ?Assessment/Plan: ?NAIDELYN PARRELLA is a 76 y.o. female present for OV for  ?Acute cough/Sore throat/Bacterial sinusitis ?- POCT rapid strep A> negative ?Rest, hydrate.  ?+/- flonase, mucinex (DM if cough), nettie pot or nasal saline.  ?Augmentin (and flonase and atrovent) prescribed, take until completed.  ?If cough present it can last up to 6-8 weeks.  ?F/U 2 weeks of not improved.  ? ?Reviewed expectations re: course of current medical issues. ?Discussed self-management of symptoms. ?Outlined signs and symptoms indicating need for more  acute intervention. ?Patient verbalized understanding and all questions were answered. ?Patient received an After-Visit Summary. ? ? ? ?Orders Placed This Encounter  ?Procedures  ? POCT rapid strep A  ? ?Meds ord

## 2021-06-27 DIAGNOSIS — M7061 Trochanteric bursitis, right hip: Secondary | ICD-10-CM | POA: Diagnosis not present

## 2021-06-29 DIAGNOSIS — Z800 Family history of malignant neoplasm of digestive organs: Secondary | ICD-10-CM | POA: Diagnosis not present

## 2021-06-29 DIAGNOSIS — K640 First degree hemorrhoids: Secondary | ICD-10-CM | POA: Diagnosis not present

## 2021-06-29 DIAGNOSIS — Z1211 Encounter for screening for malignant neoplasm of colon: Secondary | ICD-10-CM | POA: Diagnosis not present

## 2021-06-29 DIAGNOSIS — Z86010 Personal history of colonic polyps: Secondary | ICD-10-CM | POA: Diagnosis not present

## 2021-06-29 DIAGNOSIS — K5730 Diverticulosis of large intestine without perforation or abscess without bleeding: Secondary | ICD-10-CM | POA: Diagnosis not present

## 2021-06-29 DIAGNOSIS — Z8719 Personal history of other diseases of the digestive system: Secondary | ICD-10-CM | POA: Diagnosis not present

## 2021-06-29 LAB — HM COLONOSCOPY

## 2021-07-03 DIAGNOSIS — M7061 Trochanteric bursitis, right hip: Secondary | ICD-10-CM | POA: Diagnosis not present

## 2021-07-06 DIAGNOSIS — M7061 Trochanteric bursitis, right hip: Secondary | ICD-10-CM | POA: Diagnosis not present

## 2021-07-10 DIAGNOSIS — M7061 Trochanteric bursitis, right hip: Secondary | ICD-10-CM | POA: Diagnosis not present

## 2021-07-12 ENCOUNTER — Other Ambulatory Visit: Payer: Self-pay | Admitting: Family Medicine

## 2021-07-13 DIAGNOSIS — M7061 Trochanteric bursitis, right hip: Secondary | ICD-10-CM | POA: Diagnosis not present

## 2021-07-17 DIAGNOSIS — M7061 Trochanteric bursitis, right hip: Secondary | ICD-10-CM | POA: Diagnosis not present

## 2021-07-18 ENCOUNTER — Ambulatory Visit: Payer: Medicare Other | Admitting: Family Medicine

## 2021-07-19 DIAGNOSIS — M7061 Trochanteric bursitis, right hip: Secondary | ICD-10-CM | POA: Diagnosis not present

## 2021-10-10 ENCOUNTER — Other Ambulatory Visit: Payer: Self-pay | Admitting: Family Medicine

## 2021-10-10 DIAGNOSIS — H401131 Primary open-angle glaucoma, bilateral, mild stage: Secondary | ICD-10-CM | POA: Diagnosis not present

## 2021-10-11 ENCOUNTER — Telehealth: Payer: Self-pay | Admitting: Family Medicine

## 2021-10-11 ENCOUNTER — Encounter: Payer: Self-pay | Admitting: Family Medicine

## 2021-10-11 ENCOUNTER — Ambulatory Visit (INDEPENDENT_AMBULATORY_CARE_PROVIDER_SITE_OTHER): Payer: Medicare Other | Admitting: Family Medicine

## 2021-10-11 VITALS — BP 128/72 | HR 68 | Temp 98.7°F | Ht 60.5 in | Wt 118.2 lb

## 2021-10-11 DIAGNOSIS — N1832 Chronic kidney disease, stage 3b: Secondary | ICD-10-CM

## 2021-10-11 DIAGNOSIS — E559 Vitamin D deficiency, unspecified: Secondary | ICD-10-CM | POA: Diagnosis not present

## 2021-10-11 DIAGNOSIS — M810 Age-related osteoporosis without current pathological fracture: Secondary | ICD-10-CM

## 2021-10-11 DIAGNOSIS — I10 Essential (primary) hypertension: Secondary | ICD-10-CM | POA: Diagnosis not present

## 2021-10-11 DIAGNOSIS — E538 Deficiency of other specified B group vitamins: Secondary | ICD-10-CM

## 2021-10-11 DIAGNOSIS — E039 Hypothyroidism, unspecified: Secondary | ICD-10-CM | POA: Diagnosis not present

## 2021-10-11 DIAGNOSIS — E782 Mixed hyperlipidemia: Secondary | ICD-10-CM | POA: Diagnosis not present

## 2021-10-11 LAB — CBC
HCT: 33.3 % — ABNORMAL LOW (ref 36.0–46.0)
Hemoglobin: 11.2 g/dL — ABNORMAL LOW (ref 12.0–15.0)
MCHC: 33.7 g/dL (ref 30.0–36.0)
MCV: 86.7 fl (ref 78.0–100.0)
Platelets: 217 10*3/uL (ref 150.0–400.0)
RBC: 3.84 Mil/uL — ABNORMAL LOW (ref 3.87–5.11)
RDW: 12.5 % (ref 11.5–15.5)
WBC: 7.2 10*3/uL (ref 4.0–10.5)

## 2021-10-11 LAB — TSH: TSH: 0.07 u[IU]/mL — ABNORMAL LOW (ref 0.35–5.50)

## 2021-10-11 MED ORDER — LISINOPRIL 30 MG PO TABS: 30.0000 mg | ORAL_TABLET | Freq: Every day | ORAL | 1 refills | Status: DC

## 2021-10-11 MED ORDER — ATORVASTATIN CALCIUM 10 MG PO TABS
10.0000 mg | ORAL_TABLET | Freq: Every day | ORAL | 3 refills | Status: DC
Start: 2021-10-11 — End: 2022-09-12

## 2021-10-11 MED ORDER — FENOFIBRATE 145 MG PO TABS: 145.0000 mg | ORAL_TABLET | Freq: Every day | ORAL | 3 refills | Status: DC

## 2021-10-11 MED ORDER — LEVOTHYROXINE SODIUM 75 MCG PO TABS
75.0000 ug | ORAL_TABLET | Freq: Every day | ORAL | 3 refills | Status: DC
Start: 2021-10-11 — End: 2022-03-28

## 2021-10-11 NOTE — Telephone Encounter (Signed)
Spoke with patient regarding results/recommendations.  Pt is scheduled for 10 weeks out, will call if needing to reschedule

## 2021-10-11 NOTE — Patient Instructions (Signed)
No follow-ups on file.        Great to see you today.  I have refilled the medication(s) we provide.   If labs were collected, we will inform you of lab results once received either by echart message or telephone call.   - echart message- for normal results that have been seen by the patient already.   - telephone call: abnormal results or if patient has not viewed results in their echart.  

## 2021-10-11 NOTE — Progress Notes (Signed)
Cynthia Powell , 1945/07/12, 76 y.o., female MRN: [id number] Patient Care Team    Relationship Specialty Notifications Start End  Ma Hillock, DO PCP - General Family Medicine  03/02/15   Odette Fraction  Optometry  09/30/18   [facility]  Audiology  09/30/18   Hale Bogus., MD Referring Physician Gastroenterology  10/11/21     Chief Complaint  Patient presents with   Hypertension   Hyperlipidemia    Pt is not fasting     Subjective: Cynthia Powell is a 76 y.o. female present for [facility] Hypertension/hyperlipidemia- statin declined:Patient reports compliance with lisinopril 30 mg QD.Patient denies chest pain, shortness of breath, dizziness or lower extremity edema.   Pt does  take daily baby ASA. Pt is not prescribed statin (refuses statin). She is taking fenofibrate and is tolerating.  Diet: does not closely monitor.  Exercise: does not routinely exercise.  RF: HTN, HLD, FHX stroke (mother)   Hypothyroid:Pt reports plants with 88 mcg synthroid daily on an empty stomach. Denies symptoms    Vit d deficiency/osteoporosis: Pt continues to take 5000 u vit d once a week     10/11/2021    1:21 PM 05/12/2021    5:50 PM 05/10/2021    1:57 PM 01/31/2021    1:50 PM 04/05/2020    1:03 PM  Depression screen PHQ 2/9  Decreased Interest 0 0 0 0 0  Down, Depressed, Hopeless 0 0 0 0 0  PHQ - 2 Score 0 0 0 0 0    Allergies  Allergen Reactions   Iodinated Contrast Media     Other reaction(s): UNCONSCIOUSNESS   Spinach    Corn-Containing Products Diarrhea   Fish Allergy Diarrhea    Pt c/o Bluefin tuna and trout causing diarrhea.   Rice Diarrhea   Social History   Social History Narrative   Not on file   Past Medical History:  Diagnosis Date   Abnormal Pap smear of vagina [phone]   pt had h/o abnl pap; does not desire future screening/PAP   Allergy    seasonal and food   Anemia recently (2023)   Arthritis    osteoarthritis   Benign neoplasm of  colon    Cancer ([place]) 1995   cervical (cone bx)   Glaucoma    Herpes zoster 04/05/2020   A: given hx and physical findings as well as description of pain I do not think it is sciatica, pt also has vesicles on LE so may be that she is starting an outbreak P:--valtrex 1000 mg po tid x 7 d --fu if worsens or no better with meds   History of chickenpox    History of shingles    above her right knee, gets frequently.   Hyperlipidemia    using Krill oil; refuses statin   Hypertension    Osteoporosis    Sensory hearing loss, bilateral 2017   Bilateral hearing aids. [facility]   Snoring 09/27/2020   Spider veins of both lower extremities 09/12/2015   Thyroid disease 1980   Trigger finger    Wears hearing aid in both ears    Past Surgical History:  Procedure Laterality Date   BREAST SURGERY  1990   biopsy   CESAREAN SECTION     x2   COLPOSCOPY     EYE SURGERY  cataracts removed 2015   [place]   Family History  Problem Relation Age of Onset   Arthritis Mother    Diabetes Mother    Stroke Mother        dementia after   Hypertension Mother    Fibromyalgia Mother    Varicose Veins Mother    Heart disease Father    Hypertension Father    Colon cancer Father        mets liver   Pernicious anemia Father    Cancer Father    Breast cancer Sister        breast cancer   Hearing loss Maternal Grandfather    Melanoma Daughter        melanoma 2009   Diabetes Daughter    ADD / ADHD Daughter    Anxiety disorder Daughter    Learning disabilities Daughter    Lymphoma Daughter        Brain mets- on chemo ([facility])   OCD Daughter    Mental illness Daughter    Anxiety disorder Daughter    Asthma Daughter    Cancer Daughter    ADD / ADHD Daughter    Cancer Daughter    Diabetes Daughter    Learning disabilities Daughter    Allergies as of 10/11/2021       Reactions   Iodinated Contrast Media    Other reaction(s): UNCONSCIOUSNESS   Spinach     Corn-containing Products Diarrhea   Fish Allergy Diarrhea   Pt c/o Bluefin tuna and trout causing diarrhea.   Rice Diarrhea        Medication List        Accurate as of October 11, 2021 11:59 PM. If you have any questions, ask your nurse or doctor.          STOP taking these medications    amoxicillin-clavulanate 875-125 MG tablet Commonly known as: AUGMENTIN Stopped by: Howard Pouch, DO   fluticasone 50 MCG/ACT nasal spray Commonly known as: FLONASE Stopped by: Howard Pouch, DO   ipratropium 0.06 % nasal spray Commonly known as: ATROVENT Stopped by: Howard Pouch, DO       TAKE these medications    atorvastatin 10 MG tablet Commonly known as: LIPITOR Take 1 tablet (10 mg total) by mouth daily.   cholecalciferol 1000 units tablet Commonly known as: VITAMIN D Take 5,000 Units by mouth once a week.   CO Q 10 PO Take by mouth.   fenofibrate 145 MG tablet Commonly known as: Tricor Take 1 tablet (145 mg total) by mouth daily.   levothyroxine 75 MCG tablet Commonly known as: SYNTHROID Take 1 tablet (75 mcg total) by mouth daily before breakfast. What changed:  medication strength how much to take Changed by: Howard Pouch, DO   lisinopril 30 MG tablet Commonly known as: ZESTRIL Take 1 tablet (30 mg total) by mouth daily.   Lumigan 0.01 % Soln Generic drug: bimatoprost   Magnesium Citrate 200 MG Tabs   multivitamin-iron-minerals-folic acid chewable tablet Frequency:   Dosage:0.0     Instructions:  Note:   QC TUMERIC COMPLEX PO Take by mouth.   VITAMIN B 12 PO Take by mouth.        All past medical history, surgical history, allergies, family history, immunizations andmedications were updated in the EMR today and reviewed under the history and medication portions of their EMR.     ROS: Negative, with the exception of above mentioned in HPI   Objective:  BP 128/72   Pulse 68   Temp 98.7 F (37.1 C) (Oral)  Ht 5' 0.5" (1.537 m)   Wt 118  lb 3.2 oz (53.6 kg)   SpO2 95%   BMI 22.70 kg/m  Body mass index is 22.7 kg/m. Physical Exam Vitals and nursing note reviewed.  Constitutional:      General: She is not in acute distress.    Appearance: Normal appearance. She is not ill-appearing, toxic-appearing or diaphoretic.  HENT:     Head: Normocephalic and atraumatic.     Mouth/Throat:     Mouth: Mucous membranes are moist.  Eyes:     General: No scleral icterus.       Right eye: No discharge.        Left eye: No discharge.     Extraocular Movements: Extraocular movements intact.     Conjunctiva/sclera: Conjunctivae normal.     Pupils: Pupils are equal, round, and reactive to light.  Cardiovascular:     Rate and Rhythm: Normal rate and regular rhythm.  Pulmonary:     Effort: Pulmonary effort is normal. No respiratory distress.     Breath sounds: Normal breath sounds. No wheezing, rhonchi or rales.  Musculoskeletal:     Cervical back: Neck supple. No tenderness.     Right lower leg: No edema.     Left lower leg: No edema.  Lymphadenopathy:     Cervical: No cervical adenopathy.  Skin:    General: Skin is warm and dry.     Coloration: Skin is not jaundiced or pale.     Findings: No erythema or rash.  Neurological:     Mental Status: She is alert and oriented to person, place, and time. Mental status is at baseline.     Motor: No weakness.     Gait: Gait normal.  Psychiatric:        Mood and Affect: Mood normal.        Behavior: Behavior normal.        Thought Content: Thought content normal.        Judgment: Judgment normal.     No results found. No results found. No results found for this or any previous visit (from the past 24 hour(s)).  Assessment/Plan: Cynthia Powell is a 76 y.o. female present for OV for  Hypothyroidism, unspecified type - tolerating levo 88 mcg.  Continue current dose, refills will be provided and appropriate dose after results received. -TSH collected today  Stage 3b chronic kidney  disease (HCC)/vitamin D deficiency/osteoporosis -Patient was strongly encouraged to increase her hydration.  She does not drink water. -We will attempt to avoid long-term NSAIDs if possible. -GFR stable at 53 Vitamin D levels up-to-date  Hypertension/hyperlipidemia- statin declined: Stable Continue lisinopril 30 mg QD.  Continue fenofibrate Continue Lipitor 10 mg daily CBC collected today Follow-up 5.5 months   Reviewed expectations re: course of current medical issues. Discussed self-management of symptoms. Outlined signs and symptoms indicating need for more acute intervention. Patient verbalized understanding and all questions were answered. Patient received an After-Visit Summary.    Orders Placed This Encounter  Procedures   CBC   TSH    Meds ordered this encounter  Medications   lisinopril (ZESTRIL) 30 MG tablet    Sig: Take 1 tablet (30 mg total) by mouth daily.    Dispense:  90 tablet    Refill:  1   atorvastatin (LIPITOR) 10 MG tablet    Sig: Take 1 tablet (10 mg total) by mouth daily.    Dispense:  90 tablet    Refill:  3   fenofibrate (TRICOR) 145 MG tablet    Sig: Take 1 tablet (145 mg total) by mouth daily.    Dispense:  90 tablet    Refill:  3    Referral Orders  No referral(s) requested today     Note is dictated utilizing voice recognition software. Although note has been proof read prior to signing, occasional typographical errors still can be missed. If any questions arise, please do not hesitate to call for verification.   electronically signed by:  Howard Pouch, DO  [place]

## 2021-10-11 NOTE — Telephone Encounter (Signed)
Please inform patient her blood work still shows mild anemia.  It is starting to recover and return to normal though. Her thyroid is over replaced.  I have cut back her thyroid supplement to 75 mcg a day.  I called this to her mail-in pharmacy.  Repeat TSH by lab appointment only and 6-8 weeks after starting new dose.

## 2021-10-19 ENCOUNTER — Telehealth: Payer: Self-pay | Admitting: Family Medicine

## 2021-10-19 NOTE — Telephone Encounter (Signed)
Patient called and would like more clarification about lab results especially ones related to her kidney. She is concerned and would like a status update on her kidneys.

## 2021-10-19 NOTE — Telephone Encounter (Signed)
Spoke with pt regarding labs. Pt wanted to know when last kidney function has been checked. Pt was informed her next appt sched in Jan is when she is sched for a [facility] and labs.

## 2021-11-12 NOTE — Progress Notes (Unsigned)
Office Visit Note  Patient: Cynthia Powell             Date of Birth: 01/26/1946           MRN: [id number]             PCP: Ma Hillock, DO Referring: Ma Hillock, DO Visit Date: 11/23/2021 Occupation: _0 @  Subjective:  Discussed x-ray results and treatment options  History of Present Illness: Cynthia Powell is a 76 y.o. female with history of osteoporosis, osteoarthritis, and positive double-stranded DNA antibody.  Patient was last seen in the office on 05/23/2021.  She presents today to discuss DEXA results from December 2022.  She is not currently on any treatment for osteoporosis.  She has been taking vitamin D 5000 units once weekly.  She denies any recent falls or fractures.  She is apprehensive to take any medications for osteoporosis at this time especially since she has been diagnosed with chronic kidney disease which has been being managed by her PCP. AVISE ordered on 10/27/2020.  She is not exhibiting any new signs or symptoms of autoimmune disease at this time. She has not had any recent rashes, oral or nasal ulcerations, sicca symptoms, Raynaud's phenomenon, pleuritic chest pain, or palpitations.  She denies any joint swelling.  She has occasional discomfort in her right hip on the lateral aspect consistent with trochanteric bursitis.  She tried physical therapy with no improvement in her symptoms.  She has not been performing home exercises.  She experiences pain at night when laying on her right side.  She also has intermittent discomfort in her right hand which she attributes to underlying osteoarthritis.  Her symptoms are exacerbated by overuse or repetitive activities.  She denies any inflammation.   Activities of Daily Living:  Patient reports morning stiffness for 0 minutes.   Patient Reports nocturnal pain.  Difficulty dressing/grooming: Denies Difficulty climbing stairs: Denies Difficulty getting out of chair: Denies Difficulty using hands for taps, buttons,  cutlery, and/or writing: Denies  Review of Systems  Constitutional:  Positive for fatigue.  HENT:  Positive for mouth dryness. Negative for mouth sores.   Eyes:  Negative for dryness.  Respiratory:  Negative for shortness of breath.   Cardiovascular:  Negative for chest pain and palpitations.  Gastrointestinal:  Negative for blood in stool, constipation and diarrhea.  Endocrine: Negative for increased urination.  Genitourinary:  Negative for involuntary urination.  Musculoskeletal:  Positive for joint pain, joint pain and muscle tenderness. Negative for gait problem, joint swelling, myalgias, muscle weakness, morning stiffness and myalgias.  Skin:  Negative for color change, rash, hair loss and sensitivity to sunlight.  Allergic/Immunologic: Negative for susceptible to infections.  Neurological:  Positive for dizziness and headaches.  Hematological:  Negative for swollen glands.  Psychiatric/Behavioral:  Positive for sleep disturbance. Negative for depressed mood. The patient is not nervous/anxious.     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   Uses hearing aid 03/08/2015   Osteoporosis 03/08/2015   Hypothyroidism 03/02/2015   Essential hypertension 03/02/2015   Hyperlipidemia 03/02/2015   Glaucoma  47/42/5956   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/30/2010    Past Medical History:  Diagnosis Date   Abnormal Pap smear of vagina [id number]   pt had h/o abnl pap; does not desire future screening/PAP   Allergy    seasonal and food   Anemia recently (2023)    Arthritis    osteoarthritis   Benign neoplasm of colon    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   Hyperlipidemia    using Krill oil; refuses statin   Hypertension    Osteoporosis    Sensory hearing loss, bilateral 2017   Bilateral hearing aids. [facility]   Snoring 09/27/2020   Spider veins of both lower extremities 09/12/2015   Thyroid disease 1980   Trigger finger    Wears hearing aid in both ears     Family History  Problem Relation Age of Onset   Arthritis Mother    Diabetes Mother    Stroke Mother        dementia after   Hypertension Mother    Fibromyalgia Mother    Varicose Veins Mother    Heart disease Father    Hypertension Father    Colon cancer Father        mets liver   Pernicious anemia Father    Cancer Father    Breast cancer Sister        breast cancer   Hearing loss Maternal Grandfather    Melanoma Daughter        melanoma 2009   Diabetes Daughter    ADD / ADHD Daughter    Anxiety disorder Daughter    Learning disabilities Daughter    Lymphoma Daughter        Brain mets- on chemo ([facility])   OCD Daughter    Mental illness Daughter    Anxiety disorder Daughter    Asthma Daughter    Cancer Daughter    ADD / ADHD Daughter    Cancer Daughter    Diabetes Daughter    Learning disabilities Daughter    Past Surgical History:  Procedure Laterality Date   BREAST SURGERY  1990   biopsy   CESAREAN SECTION     x2   COLONOSCOPY  05/16/2021   COLPOSCOPY     EYE SURGERY  cataracts removed 2015   [place]   Social  History   Social History Narrative   Not on file   Immunization History  Administered Date(s) Administered   Fluad Quad(high Dose 65+) 12/01/2018   Influenza Split 12/26/2007, 10/21/2008, 11/15/2011   Influenza Whole 01/01/2002   Influenza, Quadrivalent, Recombinant, Inj, Pf 11/21/2017   Influenza-Unspecified 01/31/2004, 02/10/2005, 11/20/2013, 10/20/2014, 11/22/2016, 11/29/2019, 11/13/2020   MMR 09/23/2012, 10/22/2012   Moderna Covid-19 Vaccine Bivalent Booster 14yr & up 10/24/2021   PFIZER(Purple Top)SARS-COV-2 Vaccination 03/21/2019, 04/18/2019, 12/25/2019, 07/04/2020, 11/09/2020   Pneumococcal Conjugate-13 12/31/2011   Pneumococcal Polysaccharide-23 11/05/2006, 01/28/2017   Respiratory Syncytial  Virus Vaccine,Recomb Aduvanted(Arexvy) 10/24/2021   Td 01/01/2002   Tdap 10/02/2018   Zoster Recombinat (Shingrix) 10/02/2018, 12/10/2018   Zoster, Live 06/25/2013     Objective: Vital Signs: BP (!) 156/76 (BP Location: Left Arm, Patient Position: Sitting, Cuff Size: Normal)   Pulse 75   Resp 17   Ht 5' 0.5" (1.537 m)   Wt 114 lb 6.4 oz (51.9 kg)   BMI 21.97 kg/m    Physical Exam Vitals and nursing note reviewed.  Constitutional:      Appearance: She is well-developed.  HENT:     Head: Normocephalic and atraumatic.  Eyes:     Conjunctiva/sclera: Conjunctivae normal.  Cardiovascular:     Rate and Rhythm: Normal rate and regular rhythm.     Heart sounds: Normal heart sounds.  Pulmonary:     Effort: Pulmonary effort is normal.     Breath sounds: Normal breath sounds.  Abdominal:     General: Bowel sounds are normal.     Palpations: Abdomen is soft.  Musculoskeletal:     Cervical back: Normal range of motion.  Lymphadenopathy:     Cervical: No cervical adenopathy.  Skin:    General: Skin is warm and dry.     Capillary Refill: Capillary refill takes less than 2 seconds.  Neurological:     Mental Status: She is alert and oriented to person, place, and time.   Psychiatric:        Behavior: Behavior normal.      Musculoskeletal Exam: C-spine has good range of motion with no discomfort.  Shoulder joints, elbow joints, wrist joints, MCPs, PIPs, DIPs have good range of motion with no synovitis.  PIP and DIP thickening consistent with osteoarthritis of both hands, right hand greater than left.  CMC joint prominence noted bilaterally.  No tenderness or synovitis over MCP joints.  Tenderness over the right trochanteric bursa.  Hip joints have good range of motion with no groin pain.  Knee joints have good range of motion with no warmth or effusion.  Ankle joints have good range of motion with no tenderness or joint swelling.  CDAI Exam: CDAI Score: -- Patient Global: --; Provider Global: -- Swollen: --; Tender: -- Joint Exam 11/23/2021   No joint exam has been documented for this visit   There is currently no information documented on the homunculus. Go to the Rheumatology activity and complete the homunculus joint exam.  Investigation: No additional findings.  Imaging: No results found.  Recent Labs: Lab Results  Component Value Date   WBC 7.2 10/11/2021   HGB 11.2 (L) 10/11/2021   PLT 217.0 10/11/2021   NA 142 01/31/2021   K 4.1 01/31/2021   CL 108 01/31/2021   CO2 23 01/31/2021   GLUCOSE 95 01/31/2021   BUN 31 (H) 01/31/2021   CREATININE 0.90 01/31/2021   BILITOT 0.3 01/31/2021   ALKPHOS 43 04/30/2018   AST 24 01/31/2021   ALT 17 01/31/2021   PROT 6.8 01/31/2021   ALBUMIN 4.6 04/30/2018   CALCIUM 9.8 01/31/2021    Speciality Comments: No specialty comments available.  Procedures:  No procedures performed Allergies: Iodinated contrast media, Spinach, Corn-containing products, Fish allergy, and Rice   Assessment / Plan:     Visit Diagnoses: Ds DNA antibody positive - AVISE 06/20/20: Negative index.  Double-stranded DNA equivocal, ANA 1: 80 NS, phosphatidylserine IgM positive.  10/27/2020: Double-stranded DNA negative. The  patient is not exhibiting any signs or symptoms of systemic lupus at this time.  She  continues to experience arthralgias and joint stiffness but has no synovitis on examination today.  AVISE orders will be updated for further evaluation.  She was advised to notify us if she develops any new or worsening symptoms.  She will follow-up in the office in 6 months or sooner if needed.  Primary osteoarthritis of both hands: She has PIP and DIP thickening consistent with osteoarthritis of both hands.  She experiences occasional pain and stiffness in her right hand which she attributes to overuse and repetitive activities.  Discussed the importance of joint protection and muscle strengthening.  No synovitis was noted on examination today.  Trochanteric bursitis of right hip: She continues to have persistent discomfort due to trochanter bursitis of the right hip.  Her symptoms are exacerbated by lying on her right side at night.  She tried physical therapy with minimal to no improvement in her symptoms.  She has not been performing home exercises recently. Discussed the importance of trying to avoid cortisone injections given her history of osteoporosis.  Discussed the importance of performing stretching exercises on a daily basis.  Impingement syndrome of left shoulder - Followed by Dr. Ninfa Linden.  Doing well.  Good range of motion with no discomfort on examination today.  Age-related osteoporosis without current pathological fracture - Thoracic kyphosis-no midline spinal tenderness.  DEXA 03/22/2017: T score -2.8.  DEXA updated on 01/31/2021: Left femoral neck T score -2.8.  Patient presented today to discuss DEXA results as well as treatment options.  She has not had any recent falls or fractures.  She has been taking vitamin D 5000 units once weekly.  Patient was given an informational handout about the recommended dosing for calcium and vitamin D supplementation.  I also discussed treatment options today in  detail.  She is apprehensive to try bisphosphonates due to being diagnosed with chronic kidney disease by her PCP.  She is not currently under the care of a nephrologist.  She has been making dietary changes as recommended. Discussed the possible use of Prolia.  Indications, contraindications, potential side effects of Prolia were discussed today in detail.  She is apprehensive to add any new medications on this time.  She was given an informational handout about Prolia to further review.  She will notify us when and if she is ready to apply for Prolia through her insurance. Next DEXA will be due in December 2024.  Vitamin D deficiency: She is taking vitamin D 5000 units once weekly.  Other fatigue: Stable.  Other medical conditions are listed as follows:  Essential hypertension  History of glaucoma  Sensory hearing loss, bilateral  History of hyperlipidemia  Spider veins of both lower extremities  B12 deficiency  History of hypothyroidism  Orders: No orders of the defined types were placed in this encounter.  No orders of the defined types were placed in this encounter.    Follow-Up Instructions: Return for Osteoarthritis, Osteoporosis.   Ofilia Neas, PA-C  Note - This record has been created using Dragon software.  Chart creation errors have been sought, but may not always  have been located. Such creation errors do not reflect on  the standard of medical care.

## 2021-11-23 ENCOUNTER — Ambulatory Visit: Payer: Medicare Other | Attending: Physician Assistant | Admitting: Physician Assistant

## 2021-11-23 ENCOUNTER — Encounter: Payer: Self-pay | Admitting: Physician Assistant

## 2021-11-23 VITALS — BP 156/76 | HR 75 | Resp 17 | Ht 60.5 in | Wt 114.4 lb

## 2021-11-23 DIAGNOSIS — M7542 Impingement syndrome of left shoulder: Secondary | ICD-10-CM | POA: Diagnosis not present

## 2021-11-23 DIAGNOSIS — M19042 Primary osteoarthritis, left hand: Secondary | ICD-10-CM | POA: Diagnosis not present

## 2021-11-23 DIAGNOSIS — R768 Other specified abnormal immunological findings in serum: Secondary | ICD-10-CM | POA: Insufficient documentation

## 2021-11-23 DIAGNOSIS — I8393 Asymptomatic varicose veins of bilateral lower extremities: Secondary | ICD-10-CM

## 2021-11-23 DIAGNOSIS — I10 Essential (primary) hypertension: Secondary | ICD-10-CM | POA: Diagnosis not present

## 2021-11-23 DIAGNOSIS — R5383 Other fatigue: Secondary | ICD-10-CM | POA: Diagnosis not present

## 2021-11-23 DIAGNOSIS — E559 Vitamin D deficiency, unspecified: Secondary | ICD-10-CM | POA: Diagnosis not present

## 2021-11-23 DIAGNOSIS — Z8669 Personal history of other diseases of the nervous system and sense organs: Secondary | ICD-10-CM | POA: Diagnosis not present

## 2021-11-23 DIAGNOSIS — M7061 Trochanteric bursitis, right hip: Secondary | ICD-10-CM | POA: Diagnosis not present

## 2021-11-23 DIAGNOSIS — Z8639 Personal history of other endocrine, nutritional and metabolic disease: Secondary | ICD-10-CM

## 2021-11-23 DIAGNOSIS — E538 Deficiency of other specified B group vitamins: Secondary | ICD-10-CM

## 2021-11-23 DIAGNOSIS — M810 Age-related osteoporosis without current pathological fracture: Secondary | ICD-10-CM | POA: Diagnosis not present

## 2021-11-23 DIAGNOSIS — H903 Sensorineural hearing loss, bilateral: Secondary | ICD-10-CM | POA: Diagnosis not present

## 2021-11-23 DIAGNOSIS — M19041 Primary osteoarthritis, right hand: Secondary | ICD-10-CM

## 2021-11-23 DIAGNOSIS — R7689 Other specified abnormal immunological findings in serum: Secondary | ICD-10-CM

## 2021-11-27 ENCOUNTER — Encounter: Payer: Self-pay | Admitting: Family Medicine

## 2021-11-27 ENCOUNTER — Ambulatory Visit (INDEPENDENT_AMBULATORY_CARE_PROVIDER_SITE_OTHER): Payer: Medicare Other | Admitting: Family Medicine

## 2021-11-27 VITALS — BP 103/63 | HR 84 | Temp 98.6°F | Wt 113.2 lb

## 2021-11-27 DIAGNOSIS — J209 Acute bronchitis, unspecified: Secondary | ICD-10-CM

## 2021-11-27 DIAGNOSIS — R051 Acute cough: Secondary | ICD-10-CM

## 2021-11-27 MED ORDER — AMOXICILLIN-POT CLAVULANATE 875-125 MG PO TABS: 1.0000 | ORAL_TABLET | Freq: Two times a day (BID) | ORAL | 0 refills | Status: DC

## 2021-11-27 NOTE — Patient Instructions (Signed)
  Acute Bronchitis, Adult  Acute bronchitis is when air tubes in the lungs (bronchi) suddenly get swollen. The condition can make it hard for you to breathe. In adults, acute bronchitis usually goes away within 2 weeks. A cough caused by bronchitis may last up to 3 weeks. Smoking, allergies, and asthma can make the condition worse. What are the causes? Germs that cause cold and flu (viruses). The most common cause of this condition is the virus that causes the common cold. Bacteria. Substances that bother (irritate) the lungs, including: Smoke from cigarettes and other types of tobacco. Dust and pollen. Fumes from chemicals, gases, or burned fuel. Indoor or outdoor air pollution. What increases the risk? A weak body's defense system. This is also called the immune system. Any condition that affects your lungs and breathing, such as asthma. What are the signs or symptoms? A cough. Coughing up clear, yellow, or green mucus. Making high-pitched whistling sounds when you breathe, most often when you breathe out (wheezing). Runny or stuffy nose. Having too much mucus in your lungs (chest congestion). Shortness of breath. Body aches. A sore throat. How is this treated? Acute bronchitis may go away over time without treatment. Your doctor may tell you to: Drink more fluids. This will help thin your mucus so it is easier to cough up. Use a device that gets medicine into your lungs (inhaler). Use a vaporizer or a humidifier. These are machines that add water to the air. This helps with coughing and poor breathing. Take a medicine that thins mucus and helps clear it from your lungs. Take a medicine that prevents or stops coughing. It is not common to take an antibiotic medicine for this condition. Follow these instructions at home:  Take over-the-counter and prescription medicines only as told by your doctor. Use an inhaler, vaporizer, or humidifier as told by your doctor. Take two  teaspoons (10 mL) of honey at bedtime. This helps lessen your coughing at night. Drink enough fluid to keep your pee (urine) pale yellow. Do not smoke or use any products that contain nicotine or tobacco. If you need help quitting, ask your doctor. Get a lot of rest. Return to your normal activities when your doctor says that it is safe. Keep all follow-up visits. How is this prevented?  Wash your hands often with soap and water for at least 20 seconds. If you cannot use soap and water, use hand sanitizer. Avoid contact with people who have cold symptoms. Try not to touch your mouth, nose, or eyes with your hands. Avoid breathing in smoke or chemical fumes. Make sure to get the flu shot every year. Contact a doctor if: Your symptoms do not get better in 2 weeks. You have trouble coughing up the mucus. Your cough keeps you awake at night. You have a fever. Get help right away if: You cough up blood. You have chest pain. You have very bad shortness of breath. You faint or keep feeling like you are going to faint. You have a very bad headache. Your fever or chills get worse. These symptoms may be an emergency. Get help right away. Call your local emergency services (911 in the [place] Do not wait to see if the symptoms will go away. Do not drive yourself to the hospital. Summary Acute bronchitis is when air tubes in the lungs (bronchi) suddenly get swollen. In adults, acute bronchitis usually goes away within 2 weeks. Drink more fluids. This will help thin your mucus so it   is easier to cough up. Take over-the-counter and prescription medicines only as told by your doctor. Contact a doctor if your symptoms do not improve after 2 weeks of treatment. This information is not intended to replace advice given to you by your health care provider. Make sure you discuss any questions you have with your health care provider. Document Revised: 06/15/2020 Document Reviewed: 06/15/2020 Elsevier  Patient Education  2023 [employer]  

## 2021-11-27 NOTE — Progress Notes (Signed)
Cynthia Powell , 1945/11/28, 76 y.o., female MRN: [id number] Patient Care Team    Relationship Specialty Notifications Start End  Ma Hillock, DO PCP - General Family Medicine  03/02/15   Odette Fraction  Optometry  09/30/18   [facility]  Audiology  09/30/18   Hale Bogus., MD Referring Physician Gastroenterology  10/11/21     Chief Complaint  Patient presents with   Cough    Sx started 09/13. Fever 99.1 on 09/28. Did COVID test a week ago with neg result     Subjective: Pt presents for an OV with complaints of cough of 1-2 weeks duration. Symptoms started 9/13, but she feels second round of sx started about 5-7 days ago. Unsure if different etiology. Low grade fever present. Husband is ill with similar symptoms.      10/11/2021    1:21 PM 05/12/2021    5:50 PM 05/10/2021    1:57 PM 01/31/2021    1:50 PM 04/05/2020    1:03 PM  Depression screen PHQ 2/9  Decreased Interest 0 0 0 0 0  Down, Depressed, Hopeless 0 0 0 0 0  PHQ - 2 Score 0 0 0 0 0    Allergies  Allergen Reactions   Iodinated Contrast Media     Other reaction(s): UNCONSCIOUSNESS   Spinach    Corn-Containing Products Diarrhea   Fish Allergy Diarrhea    Pt c/o Bluefin tuna and trout causing diarrhea.   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   Hyperlipidemia    using Krill oil; refuses statin   Hypertension    Osteoporosis    Sensory hearing loss, bilateral 2017   Bilateral hearing aids. [facility]   Snoring 09/27/2020   Spider veins of both lower extremities 09/12/2015   Thyroid disease 1980   Trigger finger    Wears hearing aid in both ears    Past Surgical History:  Procedure Laterality Date   BREAST SURGERY  1990   biopsy   CESAREAN SECTION     x2   COLONOSCOPY  05/16/2021   COLPOSCOPY     EYE SURGERY  cataracts removed 2015   TONSILLECTOMY  1954   TUBAL LIGATION  1973   Family History  Problem Relation Age of Onset   Arthritis Mother    Diabetes Mother    Stroke Mother        dementia after   Hypertension Mother    Fibromyalgia Mother  Varicose Veins Mother    Heart disease Father    Hypertension Father    Colon cancer Father        mets liver   Pernicious anemia Father    Cancer Father    Breast cancer Sister        breast cancer   Hearing loss Maternal Grandfather    Melanoma Daughter        melanoma 2009   Diabetes Daughter    ADD / ADHD Daughter    Anxiety disorder Daughter    Learning disabilities Daughter    Lymphoma Daughter        Brain mets- on chemo ([facility])   OCD Daughter    Mental illness Daughter    Anxiety disorder Daughter    Asthma Daughter    Cancer Daughter    ADD / ADHD Daughter    Cancer Daughter    Diabetes Daughter    Learning disabilities Daughter    Allergies as of 11/27/2021       Reactions   Iodinated Contrast Media    Other reaction(s): UNCONSCIOUSNESS   Spinach    Corn-containing Products Diarrhea   Fish Allergy Diarrhea   Pt c/o Bluefin tuna and trout causing diarrhea.   Rice Diarrhea        Medication List        Accurate as of November 27, 2021  1:31 PM. If you have any questions, ask your nurse or doctor.          atorvastatin 10 MG  tablet Commonly known as: LIPITOR Take 1 tablet (10 mg total) by mouth daily.   cholecalciferol 1000 units tablet Commonly known as: VITAMIN D Take 5,000 Units by mouth once a week.   CO Q 10 PO Take by mouth.   fenofibrate 145 MG tablet Commonly known as: Tricor Take 1 tablet (145 mg total) by mouth daily.   levothyroxine 75 MCG tablet Commonly known as: SYNTHROID Take 1 tablet (75 mcg total) by mouth daily before breakfast.   lisinopril 30 MG tablet Commonly known as: ZESTRIL Take 1 tablet (30 mg total) by mouth daily.   Lumigan 0.01 % Soln Generic drug: bimatoprost   Magnesium Citrate 200 MG Tabs   multivitamin-iron-minerals-folic acid chewable tablet Frequency:   Dosage:0.0     Instructions:  Note:   QC TUMERIC COMPLEX PO Take by mouth.   VITAMIN B 12 PO Take by mouth.        All past medical history, surgical history, allergies, family history, immunizations andmedications were updated in the EMR today and reviewed under the history and medication portions of their EMR.     Review of Systems  Constitutional:  Positive for fever. Negative for chills and malaise/fatigue.  HENT:  Positive for congestion, sinus pain and sore throat. Negative for ear pain.   Eyes:  Negative for discharge and redness.  Respiratory:  Positive for cough and sputum production. Negative for hemoptysis.   Gastrointestinal:  Negative for nausea and vomiting.  Skin:  Negative for rash.  Neurological:  Negative for dizziness and headaches.   Negative, with the exception of above mentioned in HPI   Objective:  BP 103/63   Pulse 84   Temp 98.6 F (37 C)   Wt 113 lb 3.2 oz (51.3 kg)   SpO2 100%   BMI 21.74 kg/m  Body mass index is 21.74 kg/m. Physical Exam Vitals and nursing note reviewed.  Constitutional:      General: She is not in  acute distress.    Appearance: Normal appearance. She is normal weight. She is not ill-appearing or toxic-appearing.  HENT:     Head:  Normocephalic and atraumatic.     Right Ear: Tympanic membrane normal.     Left Ear: Tympanic membrane normal.     Nose: No congestion or rhinorrhea.  Eyes:     General:        Right eye: No discharge.        Left eye: No discharge.     Extraocular Movements: Extraocular movements intact.     Conjunctiva/sclera: Conjunctivae normal.     Pupils: Pupils are equal, round, and reactive to light.  Cardiovascular:     Rate and Rhythm: Normal rate and regular rhythm.  Pulmonary:     Effort: Pulmonary effort is normal. No respiratory distress.     Breath sounds: Normal breath sounds. No wheezing, rhonchi or rales.  Neurological:     Mental Status: She is alert and oriented to person, place, and time. Mental status is at baseline.  Psychiatric:        Mood and Affect: Mood normal.        Behavior: Behavior normal.        Thought Content: Thought content normal.        Judgment: Judgment normal.     No results found. No results found. No results found for this or any previous visit (from the past 24 hour(s)).  Assessment/Plan: Cynthia Powell is a 76 y.o. female present for OV for  Acute cough/Acute bronchitis with symptoms > 10 days Rest, hydrate.  Start mucinex (DM if cough) or delsym, Augmentin BID prescribed, take until completed.  If cough present it can last up to 6-8 weeks.  F/U 2 weeks if not improved.    Reviewed expectations re: course of current medical issues. Discussed self-management of symptoms. Outlined signs and symptoms indicating need for more acute intervention. Patient verbalized understanding and all questions were answered. Patient received an After-Visit Summary.    No orders of the defined types were placed in this encounter.  No orders of the defined types were placed in this encounter.  Referral Orders  No referral(s) requested today     Note is dictated utilizing voice recognition software. Although note has been proof read prior to signing,  occasional typographical errors still can be missed. If any questions arise, please do not hesitate to call for verification.   electronically signed by:  Howard Pouch, DO  [place]

## 2021-11-28 ENCOUNTER — Telehealth: Payer: Self-pay | Admitting: Family Medicine

## 2021-11-28 NOTE — Telephone Encounter (Signed)
Pt called and is concerned with taking amoxicillin with her kidney disease. She wants to make sure she is taking the correct dosage at this time. She has not currently taken the medication yet sue to it being out of stock at her pharmacy.

## 2021-11-29 NOTE — Telephone Encounter (Signed)
Of course, it is the right dose for her.

## 2021-11-29 NOTE — Telephone Encounter (Signed)
Pt advised of recommendations.  

## 2021-12-18 DIAGNOSIS — R5383 Other fatigue: Secondary | ICD-10-CM | POA: Diagnosis not present

## 2021-12-18 DIAGNOSIS — R768 Other specified abnormal immunological findings in serum: Secondary | ICD-10-CM | POA: Diagnosis not present

## 2021-12-18 DIAGNOSIS — D8989 Other specified disorders involving the immune mechanism, not elsewhere classified: Secondary | ICD-10-CM | POA: Diagnosis not present

## 2021-12-20 ENCOUNTER — Other Ambulatory Visit (INDEPENDENT_AMBULATORY_CARE_PROVIDER_SITE_OTHER): Payer: Medicare Other

## 2021-12-20 DIAGNOSIS — E039 Hypothyroidism, unspecified: Secondary | ICD-10-CM

## 2021-12-21 ENCOUNTER — Telehealth: Payer: Self-pay

## 2021-12-21 LAB — TSH: TSH: 0.04 u[IU]/mL — ABNORMAL LOW (ref 0.35–5.50)

## 2021-12-21 NOTE — Telephone Encounter (Signed)
Patient called stating that she had [facility] labs done on 12/18/2021 and that they will be faxed to our office.

## 2021-12-22 ENCOUNTER — Telehealth: Payer: Self-pay | Admitting: Family Medicine

## 2021-12-22 DIAGNOSIS — E034 Atrophy of thyroid (acquired): Secondary | ICD-10-CM

## 2021-12-22 NOTE — Telephone Encounter (Signed)
Please confirm with patient she is taking the levothyroxine 75 mcg daily.  This was lower her last visit, but her repeat labs do not reflect any change. If she does confirm she is taking levothyroxine 75 mcg daily, then we need to decrease her dose again. If she is accidentally taking the levo 88 mcg daily, then I would encourage her to start the lower dose that was called into her pharmacy last visit.

## 2021-12-22 NOTE — Telephone Encounter (Signed)
Spoke with patient regarding results/recommendations. Pt has been taking the 88 mcg. Advised pt to get rid of or place the 88 mcg in a draw away from the 75 mcg that she is supposed to be taking so she does not get them mixed up.

## 2021-12-26 ENCOUNTER — Telehealth: Payer: Self-pay | Admitting: *Deleted

## 2021-12-26 NOTE — Telephone Encounter (Signed)
Patient advised we received her [facility] lab results. Patient advised ANA is positive, titer negative. Patient scheduled for a follow up visit on 06/06/2022.

## 2021-12-29 DIAGNOSIS — Z23 Encounter for immunization: Secondary | ICD-10-CM | POA: Diagnosis not present

## 2022-01-05 ENCOUNTER — Encounter: Payer: Self-pay | Admitting: Rheumatology

## 2022-01-30 DIAGNOSIS — H401131 Primary open-angle glaucoma, bilateral, mild stage: Secondary | ICD-10-CM | POA: Diagnosis not present

## 2022-01-31 ENCOUNTER — Telehealth: Payer: Self-pay

## 2022-01-31 NOTE — Telephone Encounter (Signed)
A user error has taken place: encounter opened in error, closed for administrative reasons.

## 2022-02-02 ENCOUNTER — Ambulatory Visit (INDEPENDENT_AMBULATORY_CARE_PROVIDER_SITE_OTHER): Payer: Medicare Other | Admitting: Family Medicine

## 2022-02-02 ENCOUNTER — Encounter: Payer: Self-pay | Admitting: Family Medicine

## 2022-02-02 VITALS — BP 159/69 | HR 74 | Temp 98.3°F | Ht 60.5 in | Wt 115.0 lb

## 2022-02-02 DIAGNOSIS — G453 Amaurosis fugax: Secondary | ICD-10-CM | POA: Insufficient documentation

## 2022-02-02 DIAGNOSIS — E782 Mixed hyperlipidemia: Secondary | ICD-10-CM

## 2022-02-02 DIAGNOSIS — R011 Cardiac murmur, unspecified: Secondary | ICD-10-CM

## 2022-02-02 HISTORY — DX: Cardiac murmur, unspecified: R01.1

## 2022-02-02 MED ORDER — ASPIRIN 81 MG PO TBEC: 81.0000 mg | DELAYED_RELEASE_TABLET | Freq: Every day | ORAL | 12 refills | Status: DC
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
## 2022-02-02 NOTE — Patient Instructions (Signed)
Amaurosis Fugax Amaurosis fugax, also called transient visual loss, is a condition in which a person loses sight in one eye or, rarely, both eyes for a short time. The vision loss in the affected eye may be total or partial. The vision loss usually lasts for only a few seconds or minutes before sight returns to normal. In some cases, vision loss may last for several hours. This condition is typically caused by interruption of blood flow in the artery that supplies blood to the retina. The retina is the part of the eye that contains the nerves needed for sight. This condition can be a warning sign that a stroke may happen, either in the eye or in the brain. A stroke can result in permanent vision loss or loss of other body functions. What are the causes? This condition is caused by a loss or interruption of blood flow to the retinal artery. Causes for the change in blood flow include: A buildup of cholesterol and fats, or plaque, in the artery (atherosclerosis). If any plaque breaks off and gets into the bloodstream, it can travel to other blood vessels, such as the retinal artery. Diseases of the heart valves. Certain blood conditions, such as sickle cell anemia, leukemia, and blood clotting (coagulation) disorders. Inflammation of the arteries (vasculitis). A fast or irregular heartbeat, such as atrial fibrillation. Family history of stroke. What increases the risk? The following factors may make you more likely to develop this condition: Use of any tobacco products, including cigarettes, chewing tobacco, or electronic cigarettes. Poorly controlled diabetes. Conditions that can lead to diseases of the heart and blood vessels (cardiovascular diseases), such as: High blood pressure (hypertension). High cholesterol. Drinking too much alcohol regularly. Use of drugs, especially cocaine. Age. The risk increases with age. What are the signs or symptoms? The main symptom of this condition is  painless, sudden loss of vision in one eye. The vision loss often starts at the top and moves down, as if a curtain is being pulled down over your eye. This is usually followed by a quick return of vision. However, symptoms may last for several hours. It is important to seek medical care right away even if your symptoms go away. How is this diagnosed? This condition is diagnosed by: Medical history and physical exam. Eye exam, including dilating drops and looking at the back of your eyes. Carotid ultrasound. This checks for plaque in the carotid arteries in your neck. Magnetic resonance angiography (MRA). This checks the carotid artery and the branches that supply the brain. MRA looks for areas of blockage or disease. You may also have other tests, including: Blood tests. Electrocardiogram (ECG) to check your heart rhythm. Echocardiogram (ECHO) to check your heart function. How is this treated? Emergency treatment for this condition may involve massaging the eyeball or using certain breathing techniques to remove or ease the blockage of the retinal artery. You may also get an emergency referral to a clinic or medical center that treats strokes. Other treatments focus on reducing your risk of having a stroke in the future. These may include: Medicines, such as medicines to manage high blood pressure, diabetes, or cholesterol, or to thin the blood. Surgical procedures, such as: Carotid endarterectomy to remove plaque from the carotid artery. Carotid angioplasty and placement of a small, mesh tube (stenting) to open the blocked part of the artery. Lifestyle changes, such as stopping tobacco use, changing your diet, and getting enough exercise. Follow these instructions at home: Medicines Take over-the-counter and  prescription medicines only as told by your health care provider. If you are taking blood thinners: Talk with your health care provider before you take any medicines that contain aspirin  or NSAIDs, such as ibuprofen. These medicines increase your risk for dangerous bleeding. Take your medicine exactly as told, at the same time every day. Avoid activities that could cause injury or bruising, and follow instructions about how to prevent falls. Wear a medical alert bracelet or carry a card that lists what medicines you take. Eating and drinking  Eat a diet that includes five or more servings of fruits and vegetables each day. This may reduce the risk of stroke. Certain diets may help with high blood pressure, high cholesterol, diabetes, or obesity. These include: A diet that is low in salt (sodium) to manage high blood pressure. A high-fiber diet that is low in saturated fat, trans fat, and cholesterol to control cholesterol levels. A low-carbohydrate, low-sugar diet to manage diabetes. A reduced-calorie diet that is low in sodium, saturated fat, trans fat, and cholesterol to manage obesity. Lifestyle  Maintain a healthy weight. Stay physically active. Try to get at least 30 minutes of activity on most or all days. Do not use any products that contain nicotine or tobacco, such as cigarettes, e-cigarettes, and chewing tobacco. If you need help quitting, ask your health care provider. Do not misuse drugs. General instructions Do not drink alcohol if: Your health care provider tells you not to drink. You are pregnant, may be pregnant, or are planning to become pregnant. If you drink alcohol: Limit how much you use to: 0-1 drink a day for women. 0-2 drinks a day for men. Be aware of how much alcohol is in your drink. In the [place] one drink equals one 12 oz bottle of beer (355 mL), one 5 oz glass of wine (148 mL), or one 1 oz glass of hard liquor (44 mL). Keep all follow-up visits as told by your health care provider. This is important. Contact a health care provider if: You lose vision in one eye or both eyes. Get help right away if:  You have chest pain or an irregular  heartbeat. You have any symptoms of a stroke. "BE FAST" is an easy way to remember the main warning signs of a stroke. B - Balance. Signs are dizziness, sudden trouble walking, or loss of balance. E - Eyes. Signs are trouble seeing or a sudden change in vision. F - Face. Signs are sudden weakness or numbness of the face, or the face or eyelid drooping on one side. A - Arms. Signs are weakness or numbness in an arm. This happens suddenly and usually on one side of the body. S - Speech. Signs are sudden trouble speaking, slurred speech, or trouble understanding what people say. T - Time. Time to call emergency services. Write down what time symptoms started. You have other signs of a stroke, such as: A sudden, severe headache with no known cause. Nausea or vomiting. Seizure. These symptoms may represent a serious problem that is an emergency. Do not wait to see if the symptoms will go away. Get medical help right away. Call your local emergency services (911 in the [place] Do not drive yourself to the hospital. Summary Amaurosis fugax is a condition in which you lose sight for a short time. This condition can be a warning sign that a stroke may happen. A stroke can result in permanent vision loss or loss of other body functions. Seek medical  care right away even if your symptoms go away. This information is not intended to replace advice given to you by your health care provider. Make sure you discuss any questions you have with your health care provider. Document Revised: 05/18/2021 Document Reviewed: 08/27/2018 Elsevier Patient Education  [place].

## 2022-02-03 LAB — COMPREHENSIVE METABOLIC PANEL
AG Ratio: 1.7 (calc) (ref 1.0–2.5)
ALT: 15 U/L (ref 6–29)
AST: 19 U/L (ref 10–35)
Albumin: 4.2 g/dL (ref 3.6–5.1)
Alkaline phosphatase (APISO): 73 U/L (ref 37–153)
BUN: 21 mg/dL (ref 7–25)
CO2: 28 mmol/L (ref 20–32)
Calcium: 9.4 mg/dL (ref 8.6–10.4)
Chloride: 107 mmol/L (ref 98–110)
Creat: 0.69 mg/dL (ref 0.60–1.00)
Globulin: 2.5 g/dL (calc) (ref 1.9–3.7)
Glucose, Bld: 93 mg/dL (ref 65–99)
Potassium: 4 mmol/L (ref 3.5–5.3)
Sodium: 144 mmol/L (ref 135–146)
Total Bilirubin: 0.4 mg/dL (ref 0.2–1.2)
Total Protein: 6.7 g/dL (ref 6.1–8.1)

## 2022-02-03 LAB — SEDIMENTATION RATE: Sed Rate: 9 mm/h (ref 0–30)

## 2022-02-03 LAB — CBC WITH DIFFERENTIAL/PLATELET
Absolute Monocytes: 640 cells/uL (ref 200–950)
Basophils Absolute: 49 cells/uL (ref 0–200)
Basophils Relative: 0.6 %
Eosinophils Absolute: 203 cells/uL (ref 15–500)
Eosinophils Relative: 2.5 %
HCT: 35.5 % (ref 35.0–45.0)
Hemoglobin: 12.1 g/dL (ref 11.7–15.5)
Lymphs Abs: 2681 cells/uL (ref 850–3900)
MCH: 29.4 pg (ref 27.0–33.0)
MCHC: 34.1 g/dL (ref 32.0–36.0)
MCV: 86.4 fL (ref 80.0–100.0)
MPV: 11.2 fL (ref 7.5–12.5)
Monocytes Relative: 7.9 %
Neutro Abs: 4528 cells/uL (ref 1500–7800)
Neutrophils Relative %: 55.9 %
Platelets: 197 10*3/uL (ref 140–400)
RBC: 4.11 10*6/uL (ref 3.80–5.10)
RDW: 12.8 % (ref 11.0–15.0)
Total Lymphocyte: 33.1 %
WBC: 8.1 10*3/uL (ref 3.8–10.8)

## 2022-02-03 LAB — LDL CHOLESTEROL, DIRECT: Direct LDL: 72 mg/dL (ref <100)

## 2022-02-03 LAB — C-REACTIVE PROTEIN: CRP: 2 mg/L (ref <8.0)

## 2022-02-16 ENCOUNTER — Ambulatory Visit (HOSPITAL_COMMUNITY)
Admission: RE | Admit: 2022-02-16 | Discharge: 2022-02-16 | Disposition: A | Discharge status: Home / Self Care | Payer: Medicare Other | Source: Ambulatory Visit | Attending: Family Medicine | Admitting: Family Medicine

## 2022-02-16 DIAGNOSIS — I10 Essential (primary) hypertension: Secondary | ICD-10-CM | POA: Diagnosis not present

## 2022-02-16 DIAGNOSIS — R011 Cardiac murmur, unspecified: Secondary | ICD-10-CM | POA: Diagnosis not present

## 2022-02-16 DIAGNOSIS — I358 Other nonrheumatic aortic valve disorders: Secondary | ICD-10-CM | POA: Diagnosis not present

## 2022-02-16 DIAGNOSIS — Z8673 Personal history of transient ischemic attack (TIA), and cerebral infarction without residual deficits: Secondary | ICD-10-CM | POA: Insufficient documentation

## 2022-02-16 DIAGNOSIS — I340 Nonrheumatic mitral (valve) insufficiency: Secondary | ICD-10-CM | POA: Diagnosis not present

## 2022-02-16 DIAGNOSIS — E785 Hyperlipidemia, unspecified: Secondary | ICD-10-CM | POA: Diagnosis not present

## 2022-02-16 DIAGNOSIS — G453 Amaurosis fugax: Secondary | ICD-10-CM | POA: Diagnosis not present

## 2022-02-16 LAB — ECHOCARDIOGRAM COMPLETE
AR max vel: 2.27 cm2
AV Area VTI: 2.17 cm2
AV Area mean vel: 2.17 cm2
AV Mean grad: 4 mmHg
AV Peak grad: 7.3 mmHg
Ao pk vel: 1.36 m/s
Area-P 1/2: 3.66 cm2
Calc EF: 65 %
MV M vel: 5.58 m/s
MV Peak grad: 124.5 mmHg
Radius: 0.3 cm
S' Lateral: 2.4 cm
Single Plane A2C EF: 58.1 %
Single Plane A4C EF: 72.6 %

## 2022-02-16 NOTE — Progress Notes (Signed)
  Echocardiogram 2D Echocardiogram has been performed.  Cynthia Powell 02/16/2022, 11:57 AM

## 2022-02-22 ENCOUNTER — Telehealth: Payer: Self-pay | Admitting: Family Medicine

## 2022-02-22 DIAGNOSIS — I340 Nonrheumatic mitral (valve) insufficiency: Secondary | ICD-10-CM

## 2022-02-22 DIAGNOSIS — I358 Other nonrheumatic aortic valve disorders: Secondary | ICD-10-CM

## 2022-02-22 DIAGNOSIS — G453 Amaurosis fugax: Secondary | ICD-10-CM

## 2022-02-22 NOTE — Telephone Encounter (Signed)
Please inform patient her echocardiogram resulted with - Mild cholesterol/plaque formation aortic valve> she is prescribed atorvastatin already, so she is on the right medication.  This is very mild and not concerning as far as needing further intervention other than medicine and a heart healthy diet.  -Diastolic 1 dysfunction-which simply means that the heart does not relax fully.  This is very mild being diastolic 1 dysfunction and is found in most people of her age.  This is not concerning but occasionally may cause mild lower extremity swelling.  - mild mitral valve regurgitation-this is a leaky valve between the left chambers of her heart.  This is mild per report.  It is possibly the murmur that I heard.   I have placed the referral to cardiology for her to discuss the above findings further and further evaluate for other potential causes of her TIA, such as a transient change/abnormal rhythm of her heart.

## 2022-02-23 NOTE — Telephone Encounter (Signed)
Spoke with pt regarding labs and instructions.   

## 2022-02-27 ENCOUNTER — Other Ambulatory Visit: Payer: Medicare Other

## 2022-02-27 ENCOUNTER — Ambulatory Visit: Payer: Medicare Other | Attending: Cardiology

## 2022-02-27 ENCOUNTER — Encounter: Payer: Self-pay | Admitting: Cardiology

## 2022-02-27 ENCOUNTER — Ambulatory Visit: Payer: Medicare Other | Attending: Cardiology | Admitting: Cardiology

## 2022-02-27 VITALS — BP 138/76 | HR 86 | Ht 60.0 in | Wt 109.0 lb

## 2022-02-27 DIAGNOSIS — R079 Chest pain, unspecified: Secondary | ICD-10-CM | POA: Diagnosis not present

## 2022-02-27 DIAGNOSIS — G453 Amaurosis fugax: Secondary | ICD-10-CM | POA: Insufficient documentation

## 2022-02-27 DIAGNOSIS — I350 Nonrheumatic aortic (valve) stenosis: Secondary | ICD-10-CM | POA: Diagnosis not present

## 2022-02-27 DIAGNOSIS — E782 Mixed hyperlipidemia: Secondary | ICD-10-CM | POA: Diagnosis not present

## 2022-02-27 DIAGNOSIS — G459 Transient cerebral ischemic attack, unspecified: Secondary | ICD-10-CM

## 2022-02-27 DIAGNOSIS — I10 Essential (primary) hypertension: Secondary | ICD-10-CM | POA: Insufficient documentation

## 2022-02-27 DIAGNOSIS — I340 Nonrheumatic mitral (valve) insufficiency: Secondary | ICD-10-CM | POA: Insufficient documentation
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
## 2022-02-27 NOTE — Patient Instructions (Addendum)
Medication Instructions:  Your physician recommends that you continue on your current medications as directed. Please refer to the Current Medication list given to you today.  *If you need a refill on your cardiac medications before your next appointment, please call your pharmacy*   Lab Work: None Ordered If you have labs (blood work) drawn today and your tests are completely normal, you will receive your results only by: MyChart Message (if you have MyChart) OR A paper copy in the mail If you have any lab test that is abnormal or we need to change your treatment, we will call you to review the results.   Testing/Procedures:  WHY IS MY DOCTOR PRESCRIBING ZIO? The Zio system is proven and trusted by physicians to detect and diagnose irregular heart rhythms -- and has been prescribed to hundreds of thousands of patients.  The FDA has cleared the Zio system to monitor for many different kinds of irregular heart rhythms. In a study, physicians were able to reach a diagnosis 90% of the time with the Zio system1.  You can wear the Zio monitor -- a small, discreet, comfortable patch -- during your normal day-to-day activity, including while you sleep, shower, and exercise, while it records every single heartbeat for analysis.  1Barrett, P., et al. Comparison of 24 Hour Holter Monitoring Versus 14 Day Novel Adhesive Patch Electrocardiographic Monitoring. American Journal of Medicine, 2014.  ZIO VS. HOLTER MONITORING The Zio monitor can be comfortably worn for up to 14 days. Holter monitors can be worn for 24 to 48 hours, limiting the time to record any irregular heart rhythms you may have. Zio is able to capture data for the 51% of patients who have their first symptom-triggered arrhythmia after 48 hours.1  LIVE WITHOUT RESTRICTIONS The Zio ambulatory cardiac monitor is a small, unobtrusive, and water-resistant patch--you might even forget you're wearing it. The Zio monitor records and stores  every beat of your heart, whether you're sleeping, working out, or showering.     Follow-Up: At [facility], you and your health needs are our priority.  As part of our continuing mission to provide you with exceptional heart care, we have created designated Provider Care Teams.  These Care Teams include your primary Cardiologist (physician) and Advanced Practice Providers (APPs -  Physician Assistants and Nurse Practitioners) who all work together to provide you with the care you need, when you need it.  We recommend signing up for the patient portal called "MyChart".  Sign up information is provided on this After Visit Summary.  MyChart is used to connect with patients for Virtual Visits (Telemedicine).  Patients are able to view lab/test results, encounter notes, upcoming appointments, etc.  Non-urgent messages can be sent to your provider as well.   To learn more about what you can do with MyChart, go to [email or web address].    Your next appointment:   2 month(s)  The format for your next appointment:   In Person  Provider:   Robert Krasowski, MD    Other Instructions NA  

## 2022-02-28 DIAGNOSIS — G453 Amaurosis fugax: Secondary | ICD-10-CM | POA: Diagnosis not present

## 2022-02-28 DIAGNOSIS — H40053 Ocular hypertension, bilateral: Secondary | ICD-10-CM | POA: Diagnosis not present

## 2022-02-28 DIAGNOSIS — Z961 Presence of intraocular lens: Secondary | ICD-10-CM | POA: Diagnosis not present

## 2022-03-06 ENCOUNTER — Ambulatory Visit
Admission: RE | Admit: 2022-03-06 | Discharge: 2022-03-06 | Disposition: A | Discharge status: Home / Self Care | Payer: Medicare Other | Source: Ambulatory Visit | Attending: Family Medicine | Admitting: Family Medicine

## 2022-03-06 DIAGNOSIS — E782 Mixed hyperlipidemia: Secondary | ICD-10-CM

## 2022-03-06 DIAGNOSIS — R079 Chest pain, unspecified: Secondary | ICD-10-CM

## 2022-03-06 DIAGNOSIS — G453 Amaurosis fugax: Secondary | ICD-10-CM

## 2022-03-06 DIAGNOSIS — R413 Other amnesia: Secondary | ICD-10-CM | POA: Diagnosis not present

## 2022-03-06 DIAGNOSIS — G459 Transient cerebral ischemic attack, unspecified: Secondary | ICD-10-CM

## 2022-03-06 MED ORDER — GADOPICLENOL 0.5 MMOL/ML IV SOLN
5.0000 mL | Freq: Once | INTRAVENOUS | Status: AC | PRN
  Administered 2022-03-06: 5 mL via INTRAVENOUS

## 2022-03-07 ENCOUNTER — Telehealth: Payer: Self-pay | Admitting: Family Medicine

## 2022-03-07 DIAGNOSIS — I679 Cerebrovascular disease, unspecified: Secondary | ICD-10-CM | POA: Insufficient documentation

## 2022-03-07 DIAGNOSIS — G453 Amaurosis fugax: Secondary | ICD-10-CM

## 2022-03-07 DIAGNOSIS — Z8673 Personal history of transient ischemic attack (TIA), and cerebral infarction without residual deficits: Secondary | ICD-10-CM | POA: Insufficient documentation

## 2022-03-07 DIAGNOSIS — R413 Other amnesia: Secondary | ICD-10-CM

## 2022-03-07 NOTE — Telephone Encounter (Signed)
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  They seem to be old in nature, difficult to say if either of these are related to her recent symptoms.  I believe her recent symptoms are from a TIA.  Therefore this does put her at rather significant risk for recurrent stroke.  There was also evidence of chronic small-vessel disease present-meaning plaque/cholesterol buildup in the small vessels of her brain.  Vessels of the head and neck, have no significant stenosis present.  Continue the statin medication and the daily baby aspirin.  I have referred her to neurology to discuss in more detail and depending upon neurology opinion and her cardiologist's findings on her monitor, they will guide her on if she needs to be on an anticoagulant other than the baby aspirin started.

## 2022-03-07 NOTE — Telephone Encounter (Signed)
Spoke with pt regarding labs and instructions.   

## 2022-03-20 ENCOUNTER — Ambulatory Visit (INDEPENDENT_AMBULATORY_CARE_PROVIDER_SITE_OTHER): Payer: Medicare Other | Admitting: Neurology

## 2022-03-20 ENCOUNTER — Encounter: Payer: Self-pay | Admitting: Neurology

## 2022-03-20 VITALS — BP 176/71 | HR 73 | Ht 60.6 in | Wt 109.0 lb

## 2022-03-20 DIAGNOSIS — R419 Unspecified symptoms and signs involving cognitive functions and awareness: Secondary | ICD-10-CM

## 2022-03-20 DIAGNOSIS — I639 Cerebral infarction, unspecified: Secondary | ICD-10-CM

## 2022-03-20 NOTE — Patient Instructions (Signed)
Continue current medications Continue with daily exercise  Return as needed    There are well-accepted and sensible ways to reduce risk for Alzheimers disease and other degenerative brain disorders .  Exercise Daily Walk A daily 20 minute walk should be part of your routine. Disease related apathy can be a significant roadblock to exercise and the only way to overcome this is to make it a daily routine and perhaps have a reward at the end (something your loved one loves to eat or drink perhaps) or a personal trainer coming to the home can also be very useful. Most importantly, the patient is much more likely to exercise if the caregiver / spouse does it with him/her. In general a structured, repetitive schedule is best.  General Health: Any diseases which effect your body will effect your brain such as a pneumonia, urinary infection, blood clot, heart attack or stroke. Keep contact with your primary care doctor for regular follow ups.  Sleep. A good nights sleep is healthy for the brain. Seven hours is recommended. If you have insomnia or poor sleep habits we can give you some instructions. If you have sleep apnea wear your mask.  Diet: Eating a heart healthy diet is also a good idea; fish and poultry instead of red meat, nuts (mostly non-peanuts), vegetables, fruits, olive oil or canola oil (instead of butter), minimal salt (use other spices to flavor foods), whole grain rice, bread, cereal and pasta and wine in moderation.Research is now showing that the MIND diet, which is a combination of The Mediterranean diet and the DASH diet, is beneficial for cognitive processing and longevity. Information about this diet can be found in The MIND Diet, a book by Doyne Keel, MS, RDN, and online at [employer]bookDistributors.si  Finances, Power of Attorney and Advance Directives: You should consider putting legal safeguards in place with regard to financial and medical decision making. While  the spouse always has power of attorney for medical and financial issues in the absence of any form, you should consider what you want in case the spouse / caregiver is no longer around or capable of making decisions.

## 2022-03-20 NOTE — Progress Notes (Signed)
[facility]  PATIENT: Cynthia Powell DOB: August 17, 1945  REQUESTING CLINICIAN: Raoul Pitch, Renee A, DO HISTORY FROM: Patient  REASON FOR VISIT: Memory deficit    HISTORICAL  CHIEF COMPLAINT:  Chief Complaint  Patient presents with   New Patient (Initial Visit)    Rm 14. Alone. NX Dr. Beryle Flock 2022/internal referral for Cerebrovascular small vessel disease, Memory changes/.    INTERVAL HISTORY 03/20/2022:  Patient presents today for follow-up, last visit was in November 2022.  At that time she was complaining of memory concerns but was also found to have increased stressors with her husband being diagnosed with a PE and her cat being very sick.  At that time, her mini mental status evaluation was 30/30.  Patient was diagnosed with adjustment disorder and advised to continue current medications.  Today she is reporting her memory is worse, short-term and long-term. She is more forgetful about recent events, sometimes she will tell her daughter that she will take her to the store and then forget about it.  Now she has to carry a calendar or if she will forget everything. She still cooks, denies burning pots, still drives and no recent accident, she has not been lost in familiar places.  She does not have any issue with family members names or using items around the house.  She is still functioning independently.  Her husband is taking care of the bills, he has already always done so  In term of the abnormal MRI, I have informed patient that the strokes are chronic and she is currently on the best medical management with aspirin, Lipitor and Zestril.    HISTORY OF PRESENT ILLNESS:  This is a 77 year old woman with past medical history of hearing loss, rotator cuff impairment, hypertension and hypothyroidism who is presenting with memory decline.  Patient reported she noted her memory decline since February of this year.  She is more forgetful, she misplaced items and cannot  remember certain events.  She does live with her husband who did not complain about her memory, but mentioned that daughter has complained about her memory.  She currently drive, denies any accident, denies being lost in family or places.  She reported her husband handle the finances, he has always handled the finances.  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  Her older daughter also has type 1 diabetes and she always worry about her and now her granddaughter who lives with patient has type 1 diabetes too.  Husband recently diagnosed with PE and her cat is sick and she is worried that she will have to put her down in the next few weeks.    OTHER MEDICAL CONDITIONS: Rotator cuff impingement, Hearing los, hypertension, hypothyroidism.   REVIEW OF SYSTEMS: Full 14 system review of systems performed and negative with exception of: as noted in the HPI  ALLERGIES: Allergies  Allergen Reactions   Iodinated Contrast Media     Other reaction(s): UNCONSCIOUSNESS   Spinach    Corn-Containing Products Diarrhea   Fish Allergy Diarrhea    Pt c/o Bluefin tuna and trout causing diarrhea.   Rice Diarrhea    HOME MEDICATIONS: Outpatient Medications Prior to Visit  Medication Sig Dispense Refill   aspirin EC 81 MG tablet Take  1 tablet (81 mg total) by mouth daily. Swallow whole. 30 tablet 12   atorvastatin (LIPITOR) 10 MG tablet Take 1 tablet (10 mg total) by mouth daily. 90 tablet 3   cholecalciferol (VITAMIN D) 1000 units tablet Take 5,000 Units by mouth once a week.     Cyanocobalamin (VITAMIN B 12 PO) Take 1 tablet by mouth daily.     levothyroxine (SYNTHROID) 75 MCG tablet Take 1 tablet (75 mcg total) by mouth daily before breakfast. 90 tablet 3   lisinopril (ZESTRIL) 30 MG tablet  Take 1 tablet (30 mg total) by mouth daily. 90 tablet 1   LUMIGAN 0.01 % SOLN Place 1 drop into both eyes at bedtime.     No facility-administered medications prior to visit.    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   Hyperlipidemia    using Krill oil; refuses statin   Hypertension    Murmur, cardiac 02/02/2022   Osteoporosis    Sensory hearing loss, bilateral 2017   Bilateral hearing aids. [facility]   Snoring 09/27/2020   Spider veins of both lower extremities 09/12/2015   Thyroid disease 1980   Trigger finger    Wears hearing aid in both ears     PAST SURGICAL HISTORY: Past Surgical History:  Procedure Laterality Date   BREAST SURGERY  1990   biopsy   CESAREAN SECTION     x2   COLONOSCOPY  05/16/2021   COLPOSCOPY     EYE SURGERY  cataracts removed 2015   [place]    FAMILY HISTORY: Family History  Problem Relation Age of Onset   Arthritis Mother    Diabetes Mother    Stroke Mother        dementia after   Hypertension Mother    Fibromyalgia Mother    Varicose Veins Mother    Heart disease Father    Hypertension Father    Colon cancer Father        mets liver   Pernicious anemia Father    Cancer Father    Breast cancer Sister        breast  cancer   Hearing loss Maternal Grandfather    Melanoma Daughter        melanoma 2009   Diabetes Daughter    ADD / ADHD Daughter    Anxiety disorder Daughter    Learning disabilities Daughter    Lymphoma Daughter        Brain mets- on chemo ([facility])   OCD Daughter    Mental illness Daughter    Anxiety disorder Daughter  Asthma Daughter    Cancer Daughter    ADD / ADHD Daughter    Cancer Daughter    Diabetes Daughter    Learning disabilities Daughter     SOCIAL HISTORY: Social History   Socioeconomic History   Marital status: Married    Spouse name: Not on file   Number of children: Not on file   Years of education: Not on file   Highest education level: Bachelor's degree (e.g., BA, AB, BS)  Occupational History   Not on file  Tobacco Use   Smoking status: Never    Passive exposure: Past   Smokeless tobacco: Never   Tobacco comments:    Parents smoked a lot, I tried but quit when young  Vaping Use   Vaping Use: Never used  Substance and Sexual Activity   Alcohol use: Not Currently    Comment: rarely glass of wine a year but not for several years   Drug use: Never   Sexual activity: Yes    Birth control/protection: None    Comment: not needed  Other Topics Concern   Not on file  Social History Narrative   Not on file   Social Determinants of Health   Financial Resource Strain: Low Risk  (01/31/2022)   Overall Financial Resource Strain (CARDIA)    Difficulty of Paying Living Expenses: Not very hard  Food Insecurity: No Food Insecurity (01/31/2022)   Hunger Vital Sign    Worried About Running Out of Food in the Last Year: Never true    Ran Out of Food in the Last Year: Never true  Transportation Needs: No Transportation Needs (01/31/2022)   PRAPARE - Hydrologist (Medical): No    Lack of Transportation (Non-Medical): No  Physical Activity: Insufficiently Active (01/31/2022)   Exercise Vital Sign    Days of Exercise per Week: 1 day     Minutes of Exercise per Session: 30 min  Stress: No Stress Concern Present (01/31/2022)   [place]    Feeling of Stress : Only a little  Social Connections: Socially Integrated (01/31/2022)   Social Connection and Isolation Panel [NHANES]    Frequency of Communication with Friends and Family: More than three times a week    Frequency of Social Gatherings with Friends and Family: Once a week    Attends Religious Services: 1 to 4 times per year    Active Member of [employer] or Organizations: No    Attends Archivist Meetings: 1 to 4 times per year    Marital Status: Married  Human resources officer Violence: Not At Risk (05/12/2021)   Humiliation, Afraid, Rape, and Kick questionnaire    Fear of Current or Ex-Partner: No    Emotionally Abused: No    Physically Abused: No    Sexually Abused: No    PHYSICAL EXAM  GENERAL EXAM/CONSTITUTIONAL: Vitals:  Vitals:   03/20/22 1055 03/20/22 1107  BP: (!) 184/80 (!) 176/71  Pulse: 74 73  Weight: 109 lb (49.4 kg)   Height: 5' 0.6" (1.539 m)    Body mass index is 20.87 kg/m. Wt Readings from Last 3 Encounters:  03/20/22 109 lb (49.4 kg)  02/27/22 109 lb (49.4 kg)  02/02/22 115 lb (52.2 kg)   Patient is in no distress; well developed, nourished and groomed; neck is supple  EYES: Visual fields full to confrontation, Extraocular movements intacts,   MUSCULOSKELETAL: Gait, strength, tone, movements noted in Neurologic exam  below  NEUROLOGIC: MENTAL STATUS:     01/18/2021    9:33 AM 09/30/2018    2:24 PM 01/28/2017    9:07 AM  MMSE - Mini Mental State Exam  Orientation to time '5 5 5  '$ Orientation to Place '5 5 5  '$ Registration '3 3 3  '$ Attention/ Calculation '5 5 5  '$ Recall '3 3 3  '$ Language- name 2 objects '2 2 2  '$ Language- repeat '1 1 1  '$ Language- follow 3 step command '3 3 3  '$ Language- read & follow direction '1 1 1  '$ Write a sentence '1 1 1  '$ Copy design '1 1 1  '$ Total  score '30 30 30      '$ 03/20/2022   10:57 AM  Montreal Cognitive Assessment   Visuospatial/ Executive (0/5) 5  Naming (0/3) 3  Attention: Read list of digits (0/2) 2  Attention: Read list of letters (0/1) 1  Attention: Serial 7 subtraction starting at 100 (0/3) 3  Language: Repeat phrase (0/2) 2  Language : Fluency (0/1) 1  Abstraction (0/2) 2  Delayed Recall (0/5) 2  Orientation (0/6) 6  Total 27     CRANIAL NERVE:  2nd, 3rd, 4th, 6th - visual fields full to confrontation, extraocular muscles intact, no nystagmus 5th - facial sensation symmetric 7th - facial strength symmetric 8th - hearing intact 9th - palate elevates symmetrically, uvula midline 11th - shoulder shrug symmetric 12th - tongue protrusion midline  MOTOR:  normal bulk and tone, full strength in the BUE, BLE  SENSORY:  normal and symmetric to light touch  COORDINATION:  finger-nose-finger, fine finger movements normal  REFLEXES:  deep tendon reflexes present and symmetric  GAIT/STATION:  normal   DIAGNOSTIC DATA (LABS, IMAGING, TESTING) - I reviewed patient records, labs, notes, testing and imaging myself where available.  Lab Results  Component Value Date   WBC 8.1 02/02/2022   HGB 12.1 02/02/2022   HCT 35.5 02/02/2022   MCV 86.4 02/02/2022   PLT 197 02/02/2022      Component Value Date/Time   NA 144 02/02/2022 1535   K 4.0 02/02/2022 1535   CL 107 02/02/2022 1535   CO2 28 02/02/2022 1535   GLUCOSE 93 02/02/2022 1535   BUN 21 02/02/2022 1535   CREATININE 0.69 02/02/2022 1535   CALCIUM 9.4 02/02/2022 1535   PROT 6.7 02/02/2022 1535   ALBUMIN 4.6 04/30/2018 0908   AST 19 02/02/2022 1535   ALT 15 02/02/2022 1535   ALKPHOS 43 04/30/2018 0908   BILITOT 0.4 02/02/2022 1535   Lab Results  Component Value Date   CHOL 152 01/31/2021   HDL 37 (L) 01/31/2021   LDLCALC 90 01/31/2021   LDLDIRECT 72 02/02/2022   TRIG 148 01/31/2021   CHOLHDL 4.1 01/31/2021   Lab Results  Component Value  Date   HGBA1C 5.6 01/31/2021   Lab Results  Component Value Date   VITAMINB12 >1506 (H) 04/08/2020   Lab Results  Component Value Date   TSH 0.04 (L) 12/20/2021    MRI Brain/MRA Head and Neck 03/20/2021 No acute intracranial pathology, large vessel occlusion, or hemodynamically significant stenosis in the head or neck vessels.  ASSESSMENT AND PLAN  77 y.o. year old female with past medical history of hypothyroidism, hypertension, rotator cuff injury who is presenting with worsening memory. She has a normal exam and she is independent in all Adls and IADLs. Normal cognition.  In terms of the abnormal MRI, her stroke etiology likely small vessel disease. She is already  on Aspirin, Lisinopril and Atorvastatin. Will continue current medications and return as needed.    1. Cognitive complaints with normal exam   2. Cerebrovascular accident (CVA), unspecified mechanism ([place])     Patient Instructions  Continue current medications Continue with daily exercise  Return as needed    There are well-accepted and sensible ways to reduce risk for Alzheimers disease and other degenerative brain disorders .  Exercise Daily Walk A daily 20 minute walk should be part of your routine. Disease related apathy can be a significant roadblock to exercise and the only way to overcome this is to make it a daily routine and perhaps have a reward at the end (something your loved one loves to eat or drink perhaps) or a personal trainer coming to the home can also be very useful. Most importantly, the patient is much more likely to exercise if the caregiver / spouse does it with him/her. In general a structured, repetitive schedule is best.  General Health: Any diseases which effect your body will effect your brain such as a pneumonia, urinary infection, blood clot, heart attack or stroke. Keep contact with your primary care doctor for regular follow ups.  Sleep. A good nights sleep is healthy for the brain.  Seven hours is recommended. If you have insomnia or poor sleep habits we can give you some instructions. If you have sleep apnea wear your mask.  Diet: Eating a heart healthy diet is also a good idea; fish and poultry instead of red meat, nuts (mostly non-peanuts), vegetables, fruits, olive oil or canola oil (instead of butter), minimal salt (use other spices to flavor foods), whole grain rice, bread, cereal and pasta and wine in moderation.Research is now showing that the MIND diet, which is a combination of The Mediterranean diet and the DASH diet, is beneficial for cognitive processing and longevity. Information about this diet can be found in The MIND Diet, a book by Doyne Keel, MS, RDN, and online at [employer]book[employer].si  Finances, Power of Attorney and Advance Directives: You should consider putting legal safeguards in place with regard to financial and medical decision making. While the spouse always has power of attorney for medical and financial issues in the absence of any form, you should consider what you want in case the spouse / caregiver is no longer around or capable of making decisions.   No orders of the defined types were placed in this encounter.    No orders of the defined types were placed in this encounter.    Return if symptoms worsen or fail to improve.  I have spent a total of 30 minutes dedicated to this patient today, preparing to see patient, performing a medically appropriate examination and evaluation, ordering tests and/or medications and procedures, and counseling and educating the patient/family/caregiver; independently interpreting result and communicating results to the family/patient/caregiver; and documenting clinical information in the electronic medical record.   Alric Ran, MD 03/20/2022, 12:54 PM  [facility] [address] [place], [place] [address] [phone]

## 2022-03-28 ENCOUNTER — Ambulatory Visit (INDEPENDENT_AMBULATORY_CARE_PROVIDER_SITE_OTHER): Payer: Medicare Other | Admitting: Family Medicine

## 2022-03-28 ENCOUNTER — Telehealth: Payer: Self-pay | Admitting: Family Medicine

## 2022-03-28 ENCOUNTER — Encounter: Payer: Self-pay | Admitting: Family Medicine

## 2022-03-28 VITALS — BP 156/65 | HR 73 | Temp 98.3°F | Wt 109.0 lb

## 2022-03-28 DIAGNOSIS — E559 Vitamin D deficiency, unspecified: Secondary | ICD-10-CM | POA: Diagnosis not present

## 2022-03-28 DIAGNOSIS — N1832 Chronic kidney disease, stage 3b: Secondary | ICD-10-CM | POA: Diagnosis not present

## 2022-03-28 DIAGNOSIS — E039 Hypothyroidism, unspecified: Secondary | ICD-10-CM

## 2022-03-28 DIAGNOSIS — Z961 Presence of intraocular lens: Secondary | ICD-10-CM | POA: Diagnosis not present

## 2022-03-28 DIAGNOSIS — E782 Mixed hyperlipidemia: Secondary | ICD-10-CM

## 2022-03-28 DIAGNOSIS — E538 Deficiency of other specified B group vitamins: Secondary | ICD-10-CM | POA: Diagnosis not present

## 2022-03-28 DIAGNOSIS — G453 Amaurosis fugax: Secondary | ICD-10-CM | POA: Diagnosis not present

## 2022-03-28 DIAGNOSIS — M810 Age-related osteoporosis without current pathological fracture: Secondary | ICD-10-CM | POA: Diagnosis not present

## 2022-03-28 DIAGNOSIS — I6523 Occlusion and stenosis of bilateral carotid arteries: Secondary | ICD-10-CM

## 2022-03-28 DIAGNOSIS — I10 Essential (primary) hypertension: Secondary | ICD-10-CM | POA: Diagnosis not present

## 2022-03-28 DIAGNOSIS — H40053 Ocular hypertension, bilateral: Secondary | ICD-10-CM | POA: Diagnosis not present

## 2022-03-28 DIAGNOSIS — Z8673 Personal history of transient ischemic attack (TIA), and cerebral infarction without residual deficits: Secondary | ICD-10-CM | POA: Diagnosis not present

## 2022-03-28 LAB — T4, FREE: Free T4: 1.27 ng/dL (ref 0.60–1.60)

## 2022-03-28 LAB — T3, FREE: T3, Free: 3 pg/mL (ref 2.3–4.2)

## 2022-03-28 LAB — TSH: TSH: 0.06 u[IU]/mL — ABNORMAL LOW (ref 0.35–5.50)

## 2022-03-28 MED ORDER — LEVOTHYROXINE SODIUM 25 MCG PO TABS: 25.0000 ug | ORAL_TABLET | Freq: Every day | ORAL | 0 refills | Status: DC

## 2022-03-28 MED ORDER — LISINOPRIL 40 MG PO TABS: 40.0000 mg | ORAL_TABLET | Freq: Every day | ORAL | 3 refills | Status: DC

## 2022-03-28 NOTE — Telephone Encounter (Signed)
Please inform patient the following information: Her TSH is still over suppressed.  I have decreased her dose yet again.  Now to levothyroxine 25 mcg daily.   The current prescription she has she can continue to use until her new prescription comes in the mail.  However, with her old prescription she is only to take a half a tablet daily now Once her new prescription comes and she goes back to 1 tab daily   I also ordered an ultrasound of her thyroid to be completed at [place] to be sure there is no other cause that she is persistently over suppressed despite [place] continuing to drop her dose.

## 2022-03-28 NOTE — Patient Instructions (Addendum)
Return in about 24 weeks (around 09/12/2022) for Routine chronic condition follow-up.  We increased your lisinopril to 40 mg today. Goal BP is < 130/80 with medication in your system.       Great to see you today.  I have refilled the medication(s) we provide.   If labs were collected, we will inform you of lab results once received either by echart message or telephone call.   - echart message- for normal results that have been seen by the patient already.   - telephone call: abnormal results or if patient has not viewed results in their echart.

## 2022-03-28 NOTE — Progress Notes (Unsigned)
Cynthia Powell , [birth year], 77 y.o., female MRN: [id number] Patient Care Team    Relationship Specialty Notifications Start End  Ma Hillock, DO PCP - General Family Medicine  03/02/15   Odette Fraction  Optometry  09/30/18   [facility]  Audiology  09/30/18   Hale Bogus., MD Referring Physician Gastroenterology  10/11/21     Chief Complaint  Patient presents with   Hypothyroidism    Would like to start krill oil     Subjective: Cynthia Powell is a 77 y.o. female present for [facility] Hypertension/hyperlipidemia-/CVA/TIA:Patient reports compliance with lisinopril 30 mg QD. Patient denies chest pain, shortness of breath, dizziness or lower extremity edema.   Pt does  take daily baby ASA.  She is now taking Lipitor 10 mg daily. Diet: does not closely monitor.  Exercise: does not routinely exercise.  RF: HTN, HLD, FHX stroke (mother), TIA, CVA  Pt presents for an OV with complaints of very loss of vision in her left eye that   Hypothyroid:Pt reports compliance with 75 mcg synthroid daily on an empty stomach. Dose was lower last visit d/t over supplementation.   Vit d deficiency/osteoporosis: Pt continues to take 5000 u vit d once a week. She declined fosamax start     02/02/2022    3:01 PM 10/11/2021    1:21 PM 05/12/2021    5:50 PM 05/10/2021    1:57 PM 01/31/2021    1:50 PM  Depression screen PHQ 2/9  Decreased Interest 0 0 0 0 0  Down, Depressed, Hopeless 0 0 0 0 0  PHQ - 2 Score 0 0 0 0 0    Allergies  Allergen Reactions   Iodinated Contrast Media     Other reaction(s): UNCONSCIOUSNESS   Spinach    Corn-Containing Products Diarrhea   Fish Allergy Diarrhea    Pt c/o Bluefin tuna and trout causing diarrhea.   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   Hyperlipidemia    using Krill oil; refuses statin   Hypertension    Murmur, cardiac 02/02/2022   Osteoporosis    Sensory hearing loss, bilateral 2017   Bilateral hearing aids. [facility]   Snoring 09/27/2020   Spider veins of both lower extremities 09/12/2015   Thyroid disease 1980   Trigger finger    Wears hearing aid in both ears    Past Surgical History:  Procedure Laterality Date   BREAST SURGERY  1990   biopsy  CESAREAN SECTION     x2   COLONOSCOPY  05/16/2021   COLPOSCOPY     EYE SURGERY  cataracts removed 2015   [place]   Family History  Problem Relation Age of Onset   Arthritis Mother    Diabetes Mother    Stroke Mother        dementia after   Hypertension Mother    Fibromyalgia Mother    Varicose Veins Mother    Heart disease Father    Hypertension Father    Colon cancer Father        mets liver   Pernicious anemia Father    Cancer Father    Breast cancer Sister        breast cancer   Hearing loss Maternal Grandfather    Melanoma Daughter        melanoma 2009   Diabetes Daughter    ADD / ADHD Daughter    Anxiety disorder Daughter    Learning disabilities Daughter    Lymphoma Daughter        Brain mets- on chemo ([facility])   OCD Daughter    Mental illness Daughter    Anxiety disorder Daughter    Asthma Daughter    Cancer Daughter    ADD / ADHD Daughter    Cancer  Daughter    Diabetes Daughter    Learning disabilities Daughter    Allergies as of 03/28/2022       Reactions   Iodinated Contrast Media    Other reaction(s): UNCONSCIOUSNESS   Spinach    Corn-containing Products Diarrhea   Fish Allergy Diarrhea   Pt c/o Bluefin tuna and trout causing diarrhea.   Rice Diarrhea        Medication List        Accurate as of March 28, 2022 11:59 PM. If you have any questions, ask your nurse or doctor.          aspirin EC 81 MG tablet Take 1 tablet (81 mg total) by mouth daily. Swallow whole.   atorvastatin 10 MG tablet Commonly known as: LIPITOR Take 1 tablet (10 mg total) by mouth daily.   cholecalciferol 1000 units tablet Commonly known as: VITAMIN D Take 5,000 Units by mouth once a week.   levothyroxine 25 MCG tablet Commonly known as: SYNTHROID Take 1 tablet (25 mcg total) by mouth daily before breakfast. What changed:  medication strength how much to take Changed by: Howard Pouch, DO   lisinopril 40 MG tablet Commonly known as: ZESTRIL Take 1 tablet (40 mg total) by mouth daily. What changed:  medication strength how much to take Changed by: Howard Pouch, DO   VITAMIN B 12 PO Take 1 tablet by mouth daily.        All past medical history, surgical history, allergies, family history, immunizations andmedications were updated in the EMR today and reviewed under the history and medication portions of their EMR.     ROS: Negative, with the exception of above mentioned in HPI   Objective:  BP (!) 156/65   Pulse 73   Temp 98.3 F (36.8 C)   Wt 109 lb (49.4 kg)   SpO2 97%   BMI 20.87 kg/m  Body mass index is 20.87 kg/m. Physical Exam Vitals and nursing note reviewed.  Constitutional:      General: She is not in acute distress.    Appearance: Normal appearance. She is not ill-appearing, toxic-appearing or diaphoretic.  HENT:     Head: Normocephalic and atraumatic.  Eyes:     General: No scleral icterus.        Right eye: No discharge.        Left eye: No discharge.     Extraocular Movements: Extraocular movements intact.     Conjunctiva/sclera: Conjunctivae normal.     Pupils: Pupils are equal, round, and reactive to light.  Cardiovascular:     Rate and Rhythm: Normal rate and regular rhythm.     Heart sounds: Murmur heard.  Pulmonary:     Effort: Pulmonary effort is normal. No respiratory distress.     Breath sounds: Normal breath sounds. No wheezing, rhonchi or rales.  Musculoskeletal:     Right lower leg: No edema.     Left lower leg: No edema.  Skin:    General: Skin is warm.     Findings: No rash.  Neurological:     Mental Status: She is alert and oriented to person, place, and time. Mental status is at baseline.     Motor: No weakness.     Gait: Gait normal.  Psychiatric:        Mood and Affect: Mood normal.        Behavior: Behavior normal.        Thought Content: Thought content normal.        Judgment: Judgment normal.     No results found. No results found. Results for orders placed or performed in visit on 03/28/22 (from the past 24 hour(s))  T4, free     Status: None   Collection Time: 03/28/22  2:01 PM  Result Value Ref Range   Free T4 1.27 0.60 - 1.60 ng/dL  T3, free     Status: None   Collection Time: 03/28/22  2:01 PM  Result Value Ref Range   T3, Free 3.0 2.3 - 4.2 pg/mL  TSH     Status: Abnormal   Collection Time: 03/28/22  2:01 PM  Result Value Ref Range   TSH 0.06 (L) 0.35 - 5.50 uIU/mL    Assessment/Plan: Cynthia Powell is a 77 y.o. female present for OV for  Hypothyroidism, unspecified type -Decrease levo to 25 mcg daily.  Her labs today continue to show oversupplementation despite drop in thyroid supplement doses. Ultrasound of her thyroid also ordered to rule out any other causes of her low TSH. -TSH, T4 and 3 free collected today.   Stage 3b chronic kidney disease (HCC)/vitamin D deficiency/osteoporosis -Patient was strongly encouraged to  increase her hydration.  She does not drink water. -We will attempt to avoid long-term NSAIDs if possible. -GFR stable at 53 Vitamin D levels up-to-date  Hypertension/hyperlipidemia/TIA/h/o CVA: Above goal. Increase lisinopril 40 mg QD.  She reports home pressures are still little elevated as well.  She is asked to call in later this week with blood pressure readings for [place] on the increased dose. Continue Lipitor 10 mg daily Continue baby aspirin LDL 72- 01/2022 Labs UTD 01/2022 Follow-up 5.5 months   Reviewed expectations re: course of current medical issues. Discussed self-management of symptoms. Outlined signs and symptoms indicating need for more acute intervention. Patient verbalized understanding and all questions were answered. Patient received an After-Visit Summary.    Orders Placed This Encounter  Procedures   T4, free   T3, free   TSH    Meds ordered this encounter  Medications   lisinopril (ZESTRIL) 40 MG tablet    Sig: Take 1 tablet (40  mg total) by mouth daily.    Dispense:  90 tablet    Refill:  3    Referral Orders  No referral(s) requested today     Note is dictated utilizing voice recognition software. Although note has been proof read prior to signing, occasional typographical errors still can be missed. If any questions arise, please do not hesitate to call for verification.   electronically signed by:  Howard Pouch, DO  [place]

## 2022-03-28 NOTE — Telephone Encounter (Signed)
Spoke with patient regarding results/recommendations.  

## 2022-04-01 ENCOUNTER — Ambulatory Visit (HOSPITAL_BASED_OUTPATIENT_CLINIC_OR_DEPARTMENT_OTHER)
Admission: RE | Admit: 2022-04-01 | Discharge: 2022-04-01 | Disposition: A | Discharge status: Home / Self Care | Payer: Medicare Other | Source: Ambulatory Visit | Attending: Family Medicine | Admitting: Family Medicine

## 2022-04-01 DIAGNOSIS — E039 Hypothyroidism, unspecified: Secondary | ICD-10-CM | POA: Diagnosis not present

## 2022-04-01 DIAGNOSIS — E0590 Thyrotoxicosis, unspecified without thyrotoxic crisis or storm: Secondary | ICD-10-CM | POA: Diagnosis not present

## 2022-04-02 ENCOUNTER — Telehealth: Payer: Self-pay | Admitting: Family Medicine

## 2022-04-02 NOTE — Telephone Encounter (Signed)
Spoke with patient regarding results/recommendations.  

## 2022-04-02 NOTE — Telephone Encounter (Signed)
Her thyroid ultrasound is normal.  No thyroid nodules present.

## 2022-04-03 DIAGNOSIS — R079 Chest pain, unspecified: Secondary | ICD-10-CM | POA: Diagnosis not present

## 2022-04-03 DIAGNOSIS — G459 Transient cerebral ischemic attack, unspecified: Secondary | ICD-10-CM | POA: Diagnosis not present

## 2022-04-16 ENCOUNTER — Telehealth: Payer: Self-pay

## 2022-04-16 NOTE — Telephone Encounter (Signed)
LVM per DPR- per Dr. Wendy Poet note regarding normal Monitor results. Encouraged to call with any questions. Routed to PCP.

## 2022-05-16 ENCOUNTER — Ambulatory Visit (INDEPENDENT_AMBULATORY_CARE_PROVIDER_SITE_OTHER): Payer: Medicare Other

## 2022-05-16 ENCOUNTER — Other Ambulatory Visit: Payer: Self-pay | Admitting: Family Medicine

## 2022-05-16 VITALS — Wt 109.0 lb

## 2022-05-16 DIAGNOSIS — Z0000 Encounter for general adult medical examination without abnormal findings: Secondary | ICD-10-CM

## 2022-05-16 NOTE — Patient Instructions (Signed)
Cynthia Powell , Thank you for taking time to come for your Medicare Wellness Visit. I appreciate your ongoing commitment to your health goals. Please review the following plan we discussed and let me know if I can assist you in the future.   These are the goals we discussed:  Goals      Patient Stated     Maintain current health.        This is a list of the screening recommended for you and due dates:  Health Maintenance  Topic Date Due   Medicare Annual Wellness Visit  05/13/2022   Colon Cancer Screening  06/30/2026   DTaP/Tdap/Td vaccine (3 - Td or Tdap) 10/01/2028   Pneumonia Vaccine  Completed   Flu Shot  Completed   DEXA scan (bone density measurement)  Completed   Hepatitis C Screening: USPSTF Recommendation to screen - Ages 21-79 yo.  Completed   Zoster (Shingles) Vaccine  Completed   HPV Vaccine  Aged Out   COVID-19 Vaccine  Discontinued    Advanced directives: copies in chart   Conditions/risks identified: stay healthy  Next appointment: Follow up in one year for your annual wellness visit    Preventive Care 65 Years and Older, Female Preventive care refers to lifestyle choices and visits with your health care provider that can promote health and wellness. What does preventive care include? A yearly physical exam. This is also called an annual well check. Dental exams once or twice a year. Routine eye exams. Ask your health care provider how often you should have your eyes checked. Personal lifestyle choices, including: Daily care of your teeth and gums. Regular physical activity. Eating a healthy diet. Avoiding tobacco and drug use. Limiting alcohol use. Practicing safe sex. Taking low-dose aspirin every day. Taking vitamin and mineral supplements as recommended by your health care provider. What happens during an annual well check? The services and screenings done by your health care provider during your annual well check will depend on your age, overall  health, lifestyle risk factors, and family history of disease. Counseling  Your health care provider may ask you questions about your: Alcohol use. Tobacco use. Drug use. Emotional well-being. Home and relationship well-being. Sexual activity. Eating habits. History of falls. Memory and ability to understand (cognition). Work and work Statistician. Reproductive health. Screening  You may have the following tests or measurements: Height, weight, and BMI. Blood pressure. Lipid and cholesterol levels. These may be checked every 5 years, or more frequently if you are over 91 years old. Skin check. Lung cancer screening. You may have this screening every year starting at age 77 if you have a 30-pack-year history of smoking and currently smoke or have quit within the past 15 years. Fecal occult blood test (FOBT) of the stool. You may have this test every year starting at age 44. Flexible sigmoidoscopy or colonoscopy. You may have a sigmoidoscopy every 5 years or a colonoscopy every 10 years starting at age 87. Hepatitis C blood test. Hepatitis B blood test. Sexually transmitted disease (STD) testing. Diabetes screening. This is done by checking your blood sugar (glucose) after you have not eaten for a while (fasting). You may have this done every 1-3 years. Bone density scan. This is done to screen for osteoporosis. You may have this done starting at age 71. Mammogram. This may be done every 1-2 years. Talk to your health care provider about how often you should have regular mammograms. Talk with your health care provider about  your test results, treatment options, and if necessary, the need for more tests. Vaccines  Your health care provider may recommend certain vaccines, such as: Influenza vaccine. This is recommended every year. Tetanus, diphtheria, and acellular pertussis (Tdap, Td) vaccine. You may need a Td booster every 10 years. Zoster vaccine. You may need this after age  31. Pneumococcal 13-valent conjugate (PCV13) vaccine. One dose is recommended after age 45. Pneumococcal polysaccharide (PPSV23) vaccine. One dose is recommended after age 35. Talk to your health care provider about which screenings and vaccines you need and how often you need them. This information is not intended to replace advice given to you by your health care provider. Make sure you discuss any questions you have with your health care provider. Document Released: 03/11/2015 Document Revised: 11/02/2015 Document Reviewed: 12/14/2014 Elsevier Interactive Patient Education  2017 [place] Prevention in the Home Falls can cause injuries. They can happen to people of all ages. There are many things you can do to make your home safe and to help prevent falls. What can I do on the outside of my home? Regularly fix the edges of walkways and driveways and fix any cracks. Remove anything that might make you trip as you walk through a door, such as a raised step or threshold. Trim any bushes or trees on the path to your home. Use bright outdoor lighting. Clear any walking paths of anything that might make someone trip, such as rocks or tools. Regularly check to see if handrails are loose or broken. Make sure that both sides of any steps have handrails. Any raised decks and porches should have guardrails on the edges. Have any leaves, snow, or ice cleared regularly. Use sand or salt on walking paths during winter. Clean up any spills in your garage right away. This includes oil or grease spills. What can I do in the bathroom? Use night lights. Install grab bars by the toilet and in the tub and shower. Do not use towel bars as grab bars. Use non-skid mats or decals in the tub or shower. If you need to sit down in the shower, use a plastic, non-slip stool. Keep the floor dry. Clean up any water that spills on the floor as soon as it happens. Remove soap buildup in the tub or shower  regularly. Attach bath mats securely with double-sided non-slip rug tape. Do not have throw rugs and other things on the floor that can make you trip. What can I do in the bedroom? Use night lights. Make sure that you have a light by your bed that is easy to reach. Do not use any sheets or blankets that are too big for your bed. They should not hang down onto the floor. Have a firm chair that has side arms. You can use this for support while you get dressed. Do not have throw rugs and other things on the floor that can make you trip. What can I do in the kitchen? Clean up any spills right away. Avoid walking on wet floors. Keep items that you use a lot in easy-to-reach places. If you need to reach something above you, use a strong step stool that has a grab bar. Keep electrical cords out of the way. Do not use floor polish or wax that makes floors slippery. If you must use wax, use non-skid floor wax. Do not have throw rugs and other things on the floor that can make you trip. What can I do with  my stairs? Do not leave any items on the stairs. Make sure that there are handrails on both sides of the stairs and use them. Fix handrails that are broken or loose. Make sure that handrails are as long as the stairways. Check any carpeting to make sure that it is firmly attached to the stairs. Fix any carpet that is loose or worn. Avoid having throw rugs at the top or bottom of the stairs. If you do have throw rugs, attach them to the floor with carpet tape. Make sure that you have a light switch at the top of the stairs and the bottom of the stairs. If you do not have them, ask someone to add them for you. What else can I do to help prevent falls? Wear shoes that: Do not have high heels. Have rubber bottoms. Are comfortable and fit you well. Are closed at the toe. Do not wear sandals. If you use a stepladder: Make sure that it is fully opened. Do not climb a closed stepladder. Make sure that  both sides of the stepladder are locked into place. Ask someone to hold it for you, if possible. Clearly mark and make sure that you can see: Any grab bars or handrails. First and last steps. Where the edge of each step is. Use tools that help you move around (mobility aids) if they are needed. These include: Canes. Walkers. Scooters. Crutches. Turn on the lights when you go into a dark area. Replace any light bulbs as soon as they burn out. Set up your furniture so you have a clear path. Avoid moving your furniture around. If any of your floors are uneven, fix them. If there are any pets around you, be aware of where they are. Review your medicines with your doctor. Some medicines can make you feel dizzy. This can increase your chance of falling. Ask your doctor what other things that you can do to help prevent falls. This information is not intended to replace advice given to you by your health care provider. Make sure you discuss any questions you have with your health care provider. Document Released: 12/09/2008 Document Revised: 07/21/2015 Document Reviewed: 03/19/2014 Elsevier Interactive Patient Education  2017 [employer].

## 2022-05-16 NOTE — Progress Notes (Addendum)
I connected with  Cynthia Powell on 05/16/22 by a audio enabled telemedicine application and verified that I am speaking with the correct person using two identifiers.  Patient Location: Home  Provider Location: Home Office  I discussed the limitations of evaluation and management by telemedicine. The patient expressed understanding and agreed to proceed.   Subjective:   Cynthia Powell is a 77 y.o. female who presents for Medicare Annual (Subsequent) preventive examination.  Review of Systems     Cardiac Risk Factors include: advanced age (>56men, >75 women)     Objective:    Today's Vitals   05/16/22 1359  Weight: 109 lb (49.4 kg)   Body mass index is 20.87 kg/m.     05/16/2022    2:13 PM 05/12/2021    6:02 PM 11/25/2019   12:57 PM 09/30/2018    2:20 PM 01/28/2017    9:05 AM  Advanced Directives  Does Patient Have a Medical Advance Directive? Yes Yes Yes Yes Yes  Type of Paramedic of [place];Living will [place];Living will [place];Living will [place];Living will [place];Living will  Does patient want to make changes to medical advance directive? No - Patient declined No - Patient declined     Copy of [place] in Chart? Yes - validated most recent copy scanned in chart (See row information) No - copy requested No - copy requested Yes - validated most recent copy scanned in chart (See row information) No - copy requested    Current Medications (verified) Outpatient Encounter Medications as of 05/16/2022  Medication Sig   aspirin EC 81 MG tablet Take 1 tablet (81 mg total) by mouth daily. Swallow whole.   atorvastatin (LIPITOR) 10 MG tablet Take 1 tablet (10 mg total) by mouth daily.   cholecalciferol (VITAMIN D) 1000 units tablet Take 5,000 Units by mouth once a week.   Cyanocobalamin (VITAMIN B 12 PO) Take 1 tablet by mouth daily.   levothyroxine  (SYNTHROID) 25 MCG tablet Take 1 tablet (25 mcg total) by mouth daily before breakfast.   lisinopril (ZESTRIL) 40 MG tablet Take 1 tablet (40 mg total) by mouth daily.   No facility-administered encounter medications on file as of 05/16/2022.    Allergies (verified) Iodinated contrast media, Spinach, Corn-containing products, Fish allergy, and Rice   History: Past Medical History:  Diagnosis Date   Abnormal Pap smear of vagina 1995,2007   pt had h/o abnl pap; does not desire future screening/PAP   Allergy    seasonal and food   Anemia recently (2023)   Arthritis    osteoarthritis   Benign neoplasm of colon    Cancer ([place]) 1995   cervical ([facility] bx)   Chronic kidney disease    stage 2   Glaucoma    Herpes zoster 04/05/2020   A: given hx and physical findings as well as description of pain I do not think it is sciatica, pt also has vesicles on LE so may be that she is starting an outbreak P:--valtrex 1000 mg po tid x 7 d --fu if worsens or no better with meds   History of chickenpox    History of shingles    above her right knee, gets frequently.   Hyperlipidemia    using Krill oil; refuses statin   Hypertension    Murmur, cardiac 02/02/2022   Osteoporosis    Sensory hearing loss, bilateral 2017   Bilateral hearing  aids. [facility]   Snoring 09/27/2020   Spider veins of both lower extremities 09/12/2015   Thyroid disease 1980   Trigger finger    Wears hearing aid in both ears    Past Surgical History:  Procedure Laterality Date   BREAST SURGERY  1990   biopsy   CESAREAN SECTION     x2   COLONOSCOPY  05/16/2021   COLPOSCOPY     EYE SURGERY  cataracts removed 2015   [place]   Family History  Problem Relation Age of Onset   Arthritis Mother    Diabetes Mother    Stroke Mother        dementia after   Hypertension Mother    Fibromyalgia Mother    Varicose Veins Mother    Heart disease Father    Hypertension Father    Colon  cancer Father        mets liver   Pernicious anemia Father    Cancer Father    Breast cancer Sister        breast cancer   Hearing loss Maternal Grandfather    Melanoma Daughter        melanoma 2009   Diabetes Daughter    ADD / ADHD Daughter    Anxiety disorder Daughter    Learning disabilities Daughter    Lymphoma Daughter        Brain mets- on chemo ([facility])   OCD Daughter    Mental illness Daughter    Anxiety disorder Daughter    Asthma Daughter    Cancer Daughter    ADD / ADHD Daughter    Cancer Daughter    Diabetes Daughter    Learning disabilities Daughter    Social History   Socioeconomic History   Marital status: Married    Spouse name: Not on file   Number of children: Not on file   Years of education: Not on file   Highest education level: Bachelor's degree (e.g., BA, AB, BS)  Occupational History   Not on file  Tobacco Use   Smoking status: Never    Passive exposure: Past   Smokeless tobacco: Never   Tobacco comments:    Parents smoked a lot, I tried but quit when young  Vaping Use   Vaping Use: Never used  Substance and Sexual Activity   Alcohol use: Not Currently    Comment: rarely glass of wine a year but not for several years   Drug use: Never   Sexual activity: Yes    Birth control/protection: None    Comment: not needed  Other Topics Concern   Not on file  Social History Narrative   Not on file   Social Determinants of Health   Financial Resource Strain: Low Risk  (05/16/2022)   Overall Financial Resource Strain (CARDIA)    Difficulty of Paying Living Expenses: Not hard at all  Food Insecurity: No Food Insecurity (05/16/2022)   Hunger Vital Sign    Worried About Running Out of Food in the Last Year: Never true    Ran Out of Food in the Last Year: Never true  Transportation Needs: No Transportation Needs (05/16/2022)   PRAPARE - Hydrologist (Medical): No    Lack of Transportation (Non-Medical): No  Physical  Activity: Inactive (05/16/2022)   Exercise Vital Sign    Days of Exercise per Week: 0 days    Minutes of Exercise per Session: 0  min  Stress: No Stress Concern Present (05/16/2022)   [place]    Feeling of Stress : Not at all  Social Connections: Moderately Integrated (05/16/2022)   Social Connection and Isolation Panel [NHANES]    Frequency of Communication with Friends and Family: More than three times a week    Frequency of Social Gatherings with Friends and Family: Once a week    Attends Religious Services: 1 to 4 times per year    Active Member of [employer] or Organizations: No    Attends Music therapist: Never    Marital Status: Married    Tobacco Counseling Counseling given: Not Answered Tobacco comments: Parents smoked a lot, I tried but quit when young   Clinical Intake:  Pre-visit preparation completed: Yes  Pain : No/denies pain     BMI - recorded: 20.87 Nutritional Status: BMI of 19-24  Normal Nutritional Risks: None Diabetes: No  How often do you need to have someone help you when you read instructions, pamphlets, or other written materials from your doctor or pharmacy?: 1 - Never  Diabetic?no  Interpreter Needed?: No  Information entered by :: Charlott Rakes, LPN   Activities of Daily Living    05/16/2022    2:14 PM  In your present state of health, do you have any difficulty performing the following activities:  Hearing? 1  Comment wears hearing aids  Vision? 0  Difficulty concentrating or making decisions? 0  Walking or climbing stairs? 0  Dressing or bathing? 0  Doing errands, shopping? 0  Preparing Food and eating ? N  Using the Toilet? N  In the past six months, have you accidently leaked urine? N  Do you have problems with loss of bowel control? N  Managing your Medications? N  Managing your Finances? N  Housekeeping or managing your Housekeeping? N    Patient  Care Team: Ma Hillock, DO as PCP - General (Family Medicine) Odette Fraction ([facility], [facility] (Audiology) Hale Bogus., MD as Referring Physician (Gastroenterology)  Indicate any recent Medical Services you may have received from other than [facility] providers in the past year (date may be approximate).     Assessment:   This is a routine wellness examination for [place].  Hearing/Vision screen Hearing Screening - Comments:: Wears a hearing aid  Vision Screening - Comments:: Pt follows up with [facility] for annual ey exams  Dietary issues and exercise activities discussed:     Goals Addressed             This Visit's Progress    Patient Stated       Stay healthy        Depression Screen    05/16/2022    2:11 PM 02/02/2022    3:01 PM 10/11/2021    1:21 PM 05/12/2021    5:50 PM 05/10/2021    1:57 PM 01/31/2021    1:50 PM 04/05/2020    1:03 PM  PHQ 2/9 Scores  PHQ - 2 Score 0 0 0 0 0 0 0    Fall Risk    05/16/2022    2:14 PM 01/31/2022    2:56 PM 10/11/2021    1:21 PM 05/12/2021    5:50 PM 05/10/2021    1:58 PM  Fall Risk   Falls in the past year? 0 0 0 0 0  Number falls in past yr: 0  1 0 0  Injury with Fall? 0  0 0 0  Risk for fall due to : Impaired vision  Impaired vision No Fall Risks No Fall Risks  Follow up Falls prevention discussed  Falls evaluation completed Falls evaluation completed Falls evaluation completed    [place]:  Any stairs in or around the home? Yes  If so, are there any without handrails? No  Home free of loose throw rugs in walkways, pet beds, electrical cords, etc? Yes  Adequate lighting in your home to reduce risk of falls? Yes   ASSISTIVE DEVICES UTILIZED TO PREVENT FALLS:  Life alert? No  Use of a cane, walker or w/c? No  Grab bars in the bathroom? No  Shower chair or bench in shower? Yes  Elevated toilet seat or a handicapped toilet? No   TIMED UP AND  GO:  Was the test performed? No .  Cognitive Function: pt declined     01/18/2021    9:33 AM 09/30/2018    2:24 PM 01/28/2017    9:07 AM  MMSE - Mini Mental State Exam  Orientation to time 5 5 5   Orientation to Place 5 5 5   Registration 3 3 3   Attention/ Calculation 5 5 5   Recall 3 3 3   Language- name 2 objects 2 2 2   Language- repeat 1 1 1   Language- follow 3 step command 3 3 3   Language- read & follow direction 1 1 1   Write a sentence 1 1 1   Copy design 1 1 1   Total score 30 30 30       03/20/2022   10:57 AM  Montreal Cognitive Assessment   Visuospatial/ Executive (0/5) 5  Naming (0/3) 3  Attention: Read list of digits (0/2) 2  Attention: Read list of letters (0/1) 1  Attention: Serial 7 subtraction starting at 100 (0/3) 3  Language: Repeat phrase (0/2) 2  Language : Fluency (0/1) 1  Abstraction (0/2) 2  Delayed Recall (0/5) 2  Orientation (0/6) 6  Total 27      05/12/2021    6:04 PM  6CIT Screen  What Year? 0 points  What month? 0 points  What time? 0 points  Count back from 20 0 points  Months in reverse 0 points  Repeat phrase 0 points  Total Score 0 points    Immunizations Immunization History  Administered Date(s) Administered   Fluad Quad(high Dose 65+) 12/01/2018, 12/22/2021   Influenza Split 12/26/2007, 10/21/2008, 11/15/2011   Influenza Whole 01/01/2002   Influenza, Quadrivalent, Recombinant, Inj, Pf 11/21/2017   Influenza-Unspecified 01/31/2004, 02/10/2005, 11/20/2013, 10/20/2014, 11/22/2016, 11/29/2019, 11/13/2020   MMR 09/23/2012, 10/22/2012   PFIZER Comirnaty(Gray Top)Covid-19 Tri-Sucrose Vaccine 12/29/2021   PFIZER(Purple Top)SARS-COV-2 Vaccination 03/21/2019, 04/18/2019, 12/25/2019, 07/04/2020, 11/09/2020   Pneumococcal Conjugate-13 12/31/2011   Pneumococcal Polysaccharide-23 11/05/2006, 01/28/2017   Respiratory Syncytial Virus Vaccine,Recomb Aduvanted(Arexvy) 10/24/2021   Td 01/01/2002   Td (Adult), 2 Lf Tetanus Toxid, Preservative Free  01/01/2002   Tdap 10/02/2018   Zoster Recombinat (Shingrix) 10/02/2018, 12/10/2018   Zoster, Live 06/25/2013    TDAP status: Up to date  Flu Vaccine status: Up to date  Pneumococcal vaccine status: Up to date  Covid-19 vaccine status: Completed vaccines  Qualifies for Shingles Vaccine? Yes   Zostavax completed Yes   Shingrix Completed?: Yes  Screening Tests Health Maintenance  Topic Date Due   Medicare Annual Wellness (AWV)  05/16/2023   COLONOSCOPY (Pts 45-62yrs Insurance coverage will need to be confirmed)  06/30/2026  DTaP/Tdap/Td (3 - Td or Tdap) 10/01/2028   Pneumonia Vaccine 77+ Years old  Completed   INFLUENZA VACCINE  Completed   DEXA SCAN  Completed   Hepatitis C Screening  Completed   Zoster Vaccines- Shingrix  Completed   HPV VACCINES  Aged Out   COVID-19 Vaccine  Discontinued    Health Maintenance  There are no preventive care reminders to display for this patient.   Colorectal cancer screening: Type of screening: Colonoscopy. Completed 06/29/21. Repeat every 5 years  Mammogram status: Completed 03/22/17. Repeat every year  Bone Density status: Completed 03/22/17. Results reflect: Bone density results: OSTEOPOROSIS. Repeat every 2 years.   Additional Screening:  Hepatitis C Screening: Completed 04/30/18  Vision Screening: Recommended annual ophthalmology exams for early detection of glaucoma and other disorders of the eye. Is the patient up to date with their annual eye exam?  Yes  Who is the provider or what is the name of the office in which the patient attends annual eye exams? [facility] and Dr Katy Fitch If pt is not established with a provider, would they like to be referred to a provider to establish care? No .   Dental Screening: Recommended annual dental exams for proper oral hygiene  Community Resource Referral / Chronic Care Management: CRR required this visit?  No   CCM required this visit?  No      Plan:     I have personally reviewed  and noted the following in the patient's chart:   Medical and social history Use of alcohol, tobacco or illicit drugs  Current medications and supplements including opioid prescriptions. Patient is not currently taking opioid prescriptions. Functional ability and status Nutritional status Physical activity Advanced directives List of other physicians Hospitalizations, surgeries, and ER visits in previous 12 months Vitals Screenings to include cognitive, depression, and falls Referrals and appointments  In addition, I have reviewed and discussed with patient certain preventive protocols, quality metrics, and best practice recommendations. A written personalized care plan for preventive services as well as general preventive health recommendations were provided to patient.     Willette Brace, LPN   [id number]   Nurse Notes: Pt declined stated she had a neuro test not to long ago, pt was alert and oriented to questions asked

## 2022-05-23 NOTE — Progress Notes (Signed)
Office Visit Note  Patient: Cynthia Powell             Date of Birth: 07-28-1945           MRN: [id number]             PCP: Natalia Leatherwood, DO Referring: Natalia Leatherwood, DO Visit Date: 06/06/2022 Occupation: @GUAROCC @  Subjective:  Pain in both hands  History of Present Illness: Cynthia Powell is a 77 y.o. female with history of osteoarthritis and osteoporosis.  She returns today after her last visit on November 23, 2021.  She states about 6 months ago she was driving and had sudden blackout of her left eye vision which resolved after few minutes.  She was diagnosed with amaurosis fugax.  She had extensive workup which was unremarkable except for the narrowing of the left carotid.  She was prescribed medications for control of her hypertension.  She continues to have pain and discomfort in her bilateral hands especially her right thumb.  She also has bilateral trochanteric bursitis.  Left shoulder joint discomfort has resolved.  She continues to take vitamin D.  She is planning to start exercising again.  Patient states that she has been under a lot of stress because of her husband's health and her granddaughter's health.    Activities of Daily Living:  Patient reports morning stiffness for denies .   Patient Denies nocturnal pain.  Difficulty dressing/grooming: Denies Difficulty climbing stairs: Denies Difficulty getting out of chair: Denies Difficulty using hands for taps, buttons, cutlery, and/or writing: Denies  Review of Systems  Constitutional:  Positive for fatigue.  HENT:  Negative for mouth sores and mouth dryness.   Eyes:  Negative for dryness.  Respiratory:  Negative for shortness of breath.   Cardiovascular:  Negative for chest pain and palpitations.  Gastrointestinal:  Negative for blood in stool, constipation and diarrhea.  Endocrine: Negative for increased urination.  Genitourinary:  Negative for involuntary urination.  Musculoskeletal:  Positive for joint pain and  joint pain. Negative for gait problem, joint swelling, myalgias, muscle weakness, morning stiffness, muscle tenderness and myalgias.  Skin:  Negative for color change, rash, hair loss and sensitivity to sunlight.  Allergic/Immunologic: Negative for susceptible to infections.  Neurological:  Positive for dizziness. Negative for headaches.  Hematological:  Negative for swollen glands.  Psychiatric/Behavioral:  Negative for depressed mood and sleep disturbance. The patient is not nervous/anxious.     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 03/07/2022   Aortic stenosis: Only mild based on echocardiogram from 2023 02/27/2022   Mitral regurgitation only mild based on echocardiogram 2023 02/27/2022   Murmur, cardiac 02/02/2022   Amaurosis fugax 02/02/2022   History of colon polyps 05/16/2021   Family history of colon cancer 05/16/2021   Carotid artery calcification, bilateral- mild 05/01/2021   Cecal polyp 04/24/2021   Anemia 02/01/2021   Memory changes 09/27/2020   Enthesopathy of hip region 04/05/2020   Polyarthralgia 04/05/2020   B12 deficiency 04/05/2020   CKD (chronic kidney disease) stage 3, GFR 30-59 ml/min 11/11/2018   Vitamin D deficiency 10/29/2017   Sensory hearing loss, 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   Uses hearing aid 03/08/2015   Osteoporosis 03/08/2015   Hypothyroidism 03/02/2015   Essential hypertension 03/02/2015   Hyperlipidemia 03/02/2015   Lichenification  and lichen simplex chronicus 06/30/2010    Past Medical History:  Diagnosis Date   Abnormal Pap smear of vagina [id number]   pt had h/o abnl pap; does not desire future screening/PAP   Allergy    seasonal and food   Anemia recently (2023)   Arthritis    osteoarthritis   Benign neoplasm  of colon    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   Hyperlipidemia    using Krill oil; refuses statin   Hypertension    Murmur, cardiac 02/02/2022   Osteoporosis    Sensory hearing loss, bilateral 2017   Bilateral hearing aids. [facility]   Snoring 09/27/2020   Spider veins of both lower extremities 09/12/2015   Thyroid disease 1980   Trigger finger    Wears hearing aid in both ears     Family History  Problem Relation Age of Onset   Arthritis Mother    Diabetes Mother    Stroke Mother        dementia after   Hypertension Mother    Fibromyalgia Mother    Varicose Veins Mother    Heart disease Father    Hypertension Father    Colon cancer Father        mets liver   Pernicious anemia Father    Cancer Father    Breast cancer Sister        breast cancer   Hearing loss Maternal Grandfather    Melanoma Daughter        melanoma 2009   Diabetes Daughter    ADD / ADHD Daughter    Anxiety disorder Daughter    Learning disabilities Daughter    Lymphoma Daughter        Brain mets- on chemo ([facility])   OCD Daughter    Mental illness Daughter    Anxiety disorder Daughter    Asthma Daughter    Cancer Daughter    ADD / ADHD Daughter    Cancer Daughter    Diabetes Daughter    Learning disabilities Daughter    Past Surgical History:  Procedure Laterality Date   BREAST SURGERY  1990   biopsy   CESAREAN SECTION     x2   COLONOSCOPY  05/16/2021   COLPOSCOPY     EYE SURGERY  cataracts removed 2015   TONSILLECTOMY  1954   TUBAL LIGATION  1973   Social History   Social History  Narrative   Not on file   Immunization History  Administered Date(s) Administered   Fluad Quad(high Dose 65+) 12/01/2018, 12/22/2021   Influenza Split 12/26/2007, 10/21/2008, 11/15/2011   Influenza Whole 01/01/2002   Influenza, Quadrivalent, Recombinant, Inj, Pf 11/21/2017   Influenza-Unspecified 01/31/2004, 02/10/2005, 11/20/2013, 10/20/2014, 11/22/2016, 11/29/2019, 11/13/2020   MMR 09/23/2012, 10/22/2012   PFIZER Comirnaty(Gray Top)Covid-19 Tri-Sucrose Vaccine 12/29/2021   PFIZER(Purple Top)SARS-COV-2 Vaccination 03/21/2019, 04/18/2019, 12/25/2019, 07/04/2020, 11/09/2020   Pneumococcal Conjugate-13 12/31/2011   Pneumococcal Polysaccharide-23 11/05/2006, 01/28/2017   Respiratory Syncytial Virus Vaccine,Recomb  Aduvanted(Arexvy) 10/24/2021   Td 01/01/2002   Td (Adult), 2 Lf Tetanus Toxid, Preservative Free 01/01/2002   Tdap 10/02/2018   Zoster Recombinat (Shingrix) 10/02/2018, 12/10/2018   Zoster, Live 06/25/2013     Objective: Vital Signs: BP (!) 166/76 (BP Location: Left Arm, Patient Position: Sitting, Cuff Size: Normal)   Pulse 72   Wt 114 lb (51.7 kg)   BMI 22.26 kg/m    Physical Exam Vitals and nursing note reviewed.  Constitutional:      Appearance: She is well-developed.  HENT:     Head: Normocephalic and atraumatic.  Eyes:     Conjunctiva/sclera: Conjunctivae normal.  Cardiovascular:     Rate and Rhythm: Normal rate and regular rhythm.     Heart sounds: Normal heart sounds.  Pulmonary:     Effort: Pulmonary effort is normal.     Breath sounds: Normal breath sounds.  Abdominal:     General: Bowel sounds are normal.     Palpations: Abdomen is soft.  Musculoskeletal:     Cervical back: Normal range of motion.  Lymphadenopathy:     Cervical: No cervical adenopathy.  Skin:    General: Skin is warm and dry.     Capillary Refill: Capillary refill takes less than 2 seconds.  Neurological:     Mental Status: She is alert and oriented to person, place, and time.   Psychiatric:        Behavior: Behavior normal.      Musculoskeletal Exam: Cervical spine was in good range of motion.  She had no tenderness over thoracic or lumbar spine.  Shoulder joints, elbow joints, wrist joints, MCPs PIPs and DIPs been good range of motion with no synovitis.  She had bilateral CMC PIP and DIP thickening.  She complains of discomfort in her right CMC and first MCP joint.  She had tenderness over bilateral trochanteric bursa.  Hip joints and knee joints with good range of motion.  There was no tenderness over ankles or MTPs.  CDAI Exam: CDAI Score: -- Patient Global: --; Provider Global: -- Swollen: --; Tender: -- Joint Exam 06/06/2022   No joint exam has been documented for this visit   There is currently no information documented on the homunculus. Go to the Rheumatology activity and complete the homunculus joint exam.  Investigation: No additional findings.  Imaging: No results found.  Recent Labs: Lab Results  Component Value Date   WBC 8.1 02/02/2022   HGB 12.1 02/02/2022   PLT 197 02/02/2022   NA 144 02/02/2022   K 4.0 02/02/2022   CL 107 02/02/2022   CO2 28 02/02/2022   GLUCOSE 93 02/02/2022   BUN 21 02/02/2022   CREATININE 0.69 02/02/2022   BILITOT 0.4 02/02/2022   ALKPHOS 43 04/30/2018   AST 19 02/02/2022   ALT 15 02/02/2022   PROT 6.7 02/02/2022   ALBUMIN 4.6 04/30/2018   CALCIUM 9.4 02/02/2022    Speciality Comments: No specialty comments available.  Procedures:  No procedures performed Allergies: Iodinated contrast media, Spinach, Corn-containing products, Fish allergy, and Rice   Assessment / Plan:     Visit Diagnoses: Ds DNA antibody positive - AVISE 06/20/20: Negative index.  Double-stranded DNA equivocal, ANA 1: 80 NS, phosphatidylserine IgM positive.  10/27/2020: Double-stranded DNA negative.  Patient has no clinical features of autoimmune disease.  She denies any history of oral ulcers, nasal ulcers, malar rash,  photosensitivity, Raynaud's or lymphadenopathy.  Primary osteoarthritis of both hands-she has osteoarthritis involving bilateral hands.  She had  bilateral PIP and DIP thickening.  She has been having discomfort in the right CMC and right first MCP joint.  Joint protection muscle strengthening was discussed.  A handout on hand exercises was provided.  Use of arthritis grams was discussed.  Trochanteric bursitis of right hip-she continues to have some discomfort in her bilateral trochanteric bursa.  She has more pain over the right trochanteric bursa.  A handout on IT band stretches was given.  Age-related osteoporosis without current pathological fracture - - Thoracic kyphosis-no midline spinal tenderness.  DEXA 03/22/2017: T score -2.8.  DEXA updated on 01/31/2021: Left femoral neck T score -2.8.  Patient does not want to take any medications.  Use of calcium and vitamin D was discussed.  She has been taking vitamin D supplement.  Need for regular exercise was emphasized.  Vitamin D deficiency-she is on vitamin D supplement.  Other fatigue-she has been also under a lot of stress and has been experiencing fatigue.  Good sleep hygiene and relaxation techniques were discussed.  Amaurosis fugax-patient had an episode few months ago with no recurrence.  She had carotid artery narrowing.  Essential hypertension-blood pressure was elevated at 171/73.  She was advised to monitor blood pressure closely and follow-up with her PCP.  Patient states she is following up with the cardiologist as well.  Other medical problems are listed as follows:  History of glaucoma  Sensory hearing loss, bilateral  History of hyperlipidemia  Spider veins of both lower extremities  History of hypothyroidism  B12 deficiency  Lichenification and lichen simplex chronicus  Verne SpurrStress-she has been under a lot of stress due to her granddaughter's health and her husband's health.  Relaxation techniques were  discussed.  Orders: No orders of the defined types were placed in this encounter.  No orders of the defined types were placed in this encounter.    Follow-Up Instructions: Return in about 6 months (around 12/06/2022) for Osteoarthritis, Osteoporosis.   Pollyann SavoyShaili Chanya Chrisley, MD  Note - This record has been created using Animal nutritionistDragon software.  Chart creation errors have been sought, but may not always  have been located. Such creation errors do not reflect on  the standard of medical care.

## 2022-06-03 ENCOUNTER — Other Ambulatory Visit: Payer: Self-pay | Admitting: Family Medicine

## 2022-06-05 ENCOUNTER — Ambulatory Visit: Payer: Medicare Other | Attending: Cardiology | Admitting: Cardiology

## 2022-06-05 ENCOUNTER — Encounter: Payer: Self-pay | Admitting: Cardiology

## 2022-06-05 VITALS — BP 156/80 | HR 65 | Ht 60.0 in | Wt 112.0 lb

## 2022-06-05 DIAGNOSIS — I679 Cerebrovascular disease, unspecified: Secondary | ICD-10-CM | POA: Insufficient documentation

## 2022-06-05 DIAGNOSIS — I10 Essential (primary) hypertension: Secondary | ICD-10-CM | POA: Diagnosis not present

## 2022-06-05 DIAGNOSIS — I340 Nonrheumatic mitral (valve) insufficiency: Secondary | ICD-10-CM | POA: Diagnosis not present

## 2022-06-05 DIAGNOSIS — F900 Attention-deficit hyperactivity disorder, predominantly inattentive type: Secondary | ICD-10-CM | POA: Insufficient documentation

## 2022-06-05 DIAGNOSIS — Z8673 Personal history of transient ischemic attack (TIA), and cerebral infarction without residual deficits: Secondary | ICD-10-CM | POA: Insufficient documentation

## 2022-06-05 DIAGNOSIS — H401131 Primary open-angle glaucoma, bilateral, mild stage: Secondary | ICD-10-CM | POA: Diagnosis not present

## 2022-06-05 DIAGNOSIS — I350 Nonrheumatic aortic (valve) stenosis: Secondary | ICD-10-CM | POA: Diagnosis not present

## 2022-06-05 DIAGNOSIS — R079 Chest pain, unspecified: Secondary | ICD-10-CM | POA: Diagnosis not present

## 2022-06-05 HISTORY — DX: Attention-deficit hyperactivity disorder, predominantly inattentive type: F90.0

## 2022-06-05 MED ORDER — AMLODIPINE BESYLATE 2.5 MG PO TABS: 2.5000 mg | ORAL_TABLET | Freq: Every day | ORAL | 3 refills | Status: DC

## 2022-06-05 NOTE — Patient Instructions (Addendum)
Medication Instructions:   START: Amlodipine 2.5mg 1 tablet daily   Lab Work: None Ordered If you have labs (blood work) drawn today and your tests are completely normal, you will receive your results only by: MyChart Message (if you have MyChart) OR A paper copy in the mail If you have any lab test that is abnormal or we need to change your treatment, we will call you to review the results.   Testing/Procedures: None Ordered   Follow-Up: At [facility], you and your health needs are our priority.  As part of our continuing mission to provide you with exceptional heart care, we have created designated Provider Care Teams.  These Care Teams include your primary Cardiologist (physician) and Advanced Practice Providers (APPs -  Physician Assistants and Nurse Practitioners) who all work together to provide you with the care you need, when you need it.  We recommend signing up for the patient portal called "MyChart".  Sign up information is provided on this After Visit Summary.  MyChart is used to connect with patients for Virtual Visits (Telemedicine).  Patients are able to view lab/test results, encounter notes, upcoming appointments, etc.  Non-urgent messages can be sent to your provider as well.   To learn more about what you can do with MyChart, go to [email or web address].    Your next appointment:   6 month(s)  The format for your next appointment:   In Person  Provider:   Robert Krasowski, MD    Other Instructions NA  

## 2022-06-05 NOTE — Progress Notes (Signed)
Cardiology Office Note:    Date:  06/05/2022   ID:  Cynthia Rainwaternne M Curington, DOB [birth year], MRN [id number]  PCP:  Natalia LeatherwoodKuneff, Renee A, DO  Cardiologist:  Gypsy Balsamobert Carlei Huang, MD    Referring MD: Natalia LeatherwoodKuneff, Renee A, DO   Chief Complaint  Patient presents with   heart flutter    History of Present Illness:    Cynthia Powell is a 77 y.o. female past medical history significant for mild aortic stenosis, dyslipidemia, mild mitral regurgitation she was referred to us because of abnormality on the echocardiogram.  Another issue is the fact if she did have amaurosis fugax evaluation so far negative.  She did wear monitor which shows some supraventricular tachycardia but no other arrhythmia namely no episodes of atrial fibrillation.  Comes today to months for follow-up.  Overall all doing very well.  He does have a lot of question about different issue we spent at least 20 minutes to half an hour to talk about those problems she read something that plastic using plastic container that we warm up food in our microwave is being absorbed by badly and being deposited in plaques which make her worry about baby that potentially cause her stroke.  Overall doing well cardiac wise trying to be able be more active but does only about thousand to 1500 steps a day only.  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   Hyperlipidemia    using Krill oil; refuses statin   Hypertension    Murmur, cardiac 02/02/2022   Osteoporosis    Sensory hearing loss, bilateral 2017   Bilateral hearing aids. [facility]   Snoring 09/27/2020   Spider veins of both lower extremities 09/12/2015   Thyroid disease 1980   Trigger finger    Wears hearing aid in both ears     Past Surgical History:  Procedure Laterality Date   BREAST SURGERY  1990   biopsy   CESAREAN SECTION     x2   COLONOSCOPY  05/16/2021   COLPOSCOPY     EYE SURGERY  cataracts removed 2015   TONSILLECTOMY  1954   TUBAL LIGATION  1973    Current Medications: Current Meds  Medication Sig   amLODipine (NORVASC) 2.5 MG tablet Take 1 tablet (2.5 mg total) by mouth daily.   aspirin EC 81 MG tablet Take 1 tablet (81 mg total) by mouth daily. Swallow whole.   atorvastatin (LIPITOR) 10 MG tablet Take 1 tablet (10 mg total) by mouth daily.   cholecalciferol (VITAMIN D) 1000 units tablet Take 5,000 Units by mouth once a week.   Cyanocobalamin (VITAMIN B 12 PO) Take 1 tablet  by mouth daily.   levothyroxine (SYNTHROID) 25 MCG tablet Take 1 tablet (25 mcg total) by mouth daily before breakfast.   lisinopril (ZESTRIL) 40 MG tablet Take 1 tablet (40 mg total) by mouth daily.     Allergies:   Iodinated contrast media, Spinach, Corn-containing products, Fish allergy, and Rice   Social History   Socioeconomic History   Marital status: Married    Spouse name: Not on file   Number of children: Not on file   Years of education: Not on file   Highest education level: Bachelor's degree (e.g., BA, AB, BS)  Occupational History   Not on file  Tobacco Use   Smoking status: Never    Passive exposure: Past   Smokeless tobacco: Never   Tobacco comments:    Parents smoked a lot, I tried but quit when young  Vaping Use   Vaping Use: Never used  Substance and Sexual Activity   Alcohol use: Not Currently    Comment: rarely  glass of wine a year but not for several years   Drug use: Never   Sexual activity: Yes    Birth control/protection: None    Comment: not needed  Other Topics Concern   Not on file  Social History Narrative   Not on file   Social Determinants of Health   Financial Resource Strain: Low Risk  (05/16/2022)   Overall Financial Resource Strain (CARDIA)    Difficulty of Paying Living Expenses: Not hard at all  Food Insecurity: No Food Insecurity (05/16/2022)   Hunger Vital Sign    Worried About Running Out of Food in the Last Year: Never true    Ran Out of Food in the Last Year: Never true  Transportation Needs: No Transportation Needs (05/16/2022)   PRAPARE - Administrator, Civil Service (Medical): No    Lack of Transportation (Non-Medical): No  Physical Activity: Inactive (05/16/2022)   Exercise Vital Sign    Days of Exercise per Week: 0 days    Minutes of Exercise per Session: 0 min  Stress: No Stress Concern Present (05/16/2022)   [employer] of Occupational Health - Occupational Stress Questionnaire    Feeling of Stress : Not at all  Social Connections: Moderately Integrated (05/16/2022)   Social Connection and Isolation Panel [NHANES]    Frequency of Communication with Friends and Family: More than three times a week    Frequency of Social Gatherings with Friends and Family: Once a week    Attends Religious Services: 1 to 4 times per year    Active Member of [employer] or Organizations: No    Attends Banker Meetings: Never    Marital Status: Married     Family History: The patient's family history includes ADD / ADHD in her daughter and daughter; Anxiety disorder in her daughter and daughter; Arthritis in her mother; Asthma in her daughter; Breast cancer in her sister; Cancer in her daughter, daughter, and father; Colon cancer in her father; Diabetes in her daughter, daughter, and mother; Fibromyalgia in her mother; Hearing loss in her maternal  grandfather; Heart disease in her father; Hypertension in her father and mother; Learning disabilities in her daughter and daughter; Lymphoma in her daughter; Melanoma in her daughter; Mental illness in her daughter; OCD in her daughter; Pernicious anemia in her father; Stroke in her mother; Varicose Veins in her mother. ROS:   Please see the history of present illness.    All 14 point review of systems  negative except as described per history of present illness  EKGs/Labs/Other Studies Reviewed:      Recent Labs: 02/02/2022: ALT 15; BUN 21; Creat 0.69; Hemoglobin 12.1; Platelets 197; Potassium 4.0; Sodium 144 03/28/2022: TSH 0.06  Recent Lipid Panel    Component Value Date/Time   CHOL 152 01/31/2021 1459   TRIG 148 01/31/2021 1459   HDL 37 (L) 01/31/2021 1459   CHOLHDL 4.1 01/31/2021 1459   VLDL 30.0 11/11/2018 0958   LDLCALC 90 01/31/2021 1459   LDLDIRECT 72 02/02/2022 1535    Physical Exam:    VS:  BP (!) 156/80 (BP Location: Left Arm, Patient Position: Sitting)   Pulse 65   Ht 5' (1.524 m)   Wt 112 lb (50.8 kg)   SpO2 98%   BMI 21.87 kg/m     Wt Readings from Last 3 Encounters:  06/05/22 112 lb (50.8 kg)  05/16/22 109 lb (49.4 kg)  03/28/22 109 lb (49.4 kg)     GEN:  Well nourished, well developed in no acute distress HEENT: Normal NECK: No JVD; No carotid bruits LYMPHATICS: No lymphadenopathy CARDIAC: RRR, no murmurs, no rubs, no gallops RESPIRATORY:  Clear to auscultation without rales, wheezing or rhonchi  ABDOMEN: Soft, non-tender, non-distended MUSCULOSKELETAL:  No edema; No deformity  SKIN: Warm and dry LOWER EXTREMITIES: no swelling NEUROLOGIC:  Alert and oriented x 3 PSYCHIATRIC:  Normal affect   ASSESSMENT:    1. Chest pain of uncertain etiology   2. Nonrheumatic aortic valve stenosis   3. Essential hypertension   4. Cerebrovascular small vessel disease   5. 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  Nonrheumatic mitral valve regurgitation    PLAN:    In order of problems listed above:  Chest pain denies having any. Nonrheumatic arctic valve stenosis only mild.  Continue monitoring. Essential hypertension which is uncontrolled, I asked her to start taking amlodipine 2.5 mg on top of all medication she has been taking. Dyslipidemia I did review her K PN which show her LDL 76.  Will continue monitoring. History of amaurosis fugax also history of old stroke.  We had a long discussion about what to do with the situation for now we decided to continue antiplatelet therapy as well as statin.     Medication Adjustments/Labs and Tests Ordered: Current medicines are reviewed at length with the patient today.  Concerns regarding medicines are outlined above.  Orders Placed This Encounter  Procedures   EKG 12-Lead   Medication changes:  Meds ordered this encounter  Medications   amLODipine (NORVASC) 2.5 MG tablet    Sig: Take 1 tablet (2.5 mg total) by mouth daily.    Dispense:  90 tablet    Refill:  3    Signed, Georgeanna Lea, MD, [facility] 06/05/2022 11:32 AM    [facility]

## 2022-06-06 ENCOUNTER — Ambulatory Visit: Payer: Medicare Other | Attending: Rheumatology | Admitting: Rheumatology

## 2022-06-06 ENCOUNTER — Encounter: Payer: Self-pay | Admitting: Rheumatology

## 2022-06-06 VITALS — BP 166/76 | HR 72 | Wt 114.0 lb

## 2022-06-06 DIAGNOSIS — Z8669 Personal history of other diseases of the nervous system and sense organs: Secondary | ICD-10-CM

## 2022-06-06 DIAGNOSIS — G453 Amaurosis fugax: Secondary | ICD-10-CM

## 2022-06-06 DIAGNOSIS — F439 Reaction to severe stress, unspecified: Secondary | ICD-10-CM

## 2022-06-06 DIAGNOSIS — E559 Vitamin D deficiency, unspecified: Secondary | ICD-10-CM | POA: Diagnosis not present

## 2022-06-06 DIAGNOSIS — H903 Sensorineural hearing loss, bilateral: Secondary | ICD-10-CM

## 2022-06-06 DIAGNOSIS — M810 Age-related osteoporosis without current pathological fracture: Secondary | ICD-10-CM

## 2022-06-06 DIAGNOSIS — E538 Deficiency of other specified B group vitamins: Secondary | ICD-10-CM

## 2022-06-06 DIAGNOSIS — I8393 Asymptomatic varicose veins of bilateral lower extremities: Secondary | ICD-10-CM

## 2022-06-06 DIAGNOSIS — L280 Lichen simplex chronicus: Secondary | ICD-10-CM

## 2022-06-06 DIAGNOSIS — M7542 Impingement syndrome of left shoulder: Secondary | ICD-10-CM

## 2022-06-06 DIAGNOSIS — R768 Other specified abnormal immunological findings in serum: Secondary | ICD-10-CM

## 2022-06-06 DIAGNOSIS — M7061 Trochanteric bursitis, right hip: Secondary | ICD-10-CM | POA: Diagnosis not present

## 2022-06-06 DIAGNOSIS — I10 Essential (primary) hypertension: Secondary | ICD-10-CM

## 2022-06-06 DIAGNOSIS — Z8639 Personal history of other endocrine, nutritional and metabolic disease: Secondary | ICD-10-CM

## 2022-06-06 DIAGNOSIS — M19041 Primary osteoarthritis, right hand: Secondary | ICD-10-CM | POA: Diagnosis not present

## 2022-06-06 DIAGNOSIS — M19042 Primary osteoarthritis, left hand: Secondary | ICD-10-CM | POA: Insufficient documentation

## 2022-06-06 DIAGNOSIS — R5383 Other fatigue: Secondary | ICD-10-CM | POA: Diagnosis not present

## 2022-06-06 NOTE — Patient Instructions (Signed)
Hand Exercises Hand exercises can be helpful for almost anyone. These exercises can strengthen the hands, improve flexibility and movement, and increase blood flow to the hands. These results can make work and daily tasks easier. Hand exercises can be especially helpful for people who have joint pain from arthritis or have nerve damage from overuse (carpal tunnel syndrome). These exercises can also help people who have injured a hand. Exercises Most of these hand exercises are gentle stretching and motion exercises. It is usually safe to do them often throughout the day. Warming up your hands before exercise may help to reduce stiffness. You can do this with gentle massage or by placing your hands in warm water for 10-15 minutes. It is normal to feel some stretching, pulling, tightness, or mild discomfort as you begin new exercises. This will gradually improve. Stop an exercise right away if you feel sudden, severe pain or your pain gets worse. Ask your health care provider which exercises are best for you. Knuckle bend or "claw" fist  Stand or sit with your arm, hand, and all five fingers pointed straight up. Make sure to keep your wrist straight during the exercise. Gently bend your fingers down toward your palm until the tips of your fingers are touching the top of your palm. Keep your big knuckle straight and just bend the small knuckles in your fingers. Hold this position for __________ seconds. Straighten (extend) your fingers back to the starting position. Repeat this exercise 5-10 times with each hand. Full finger fist  Stand or sit with your arm, hand, and all five fingers pointed straight up. Make sure to keep your wrist straight during the exercise. Gently bend your fingers into your palm until the tips of your fingers are touching the middle of your palm. Hold this position for __________ seconds. Extend your fingers back to the starting position, stretching every joint fully. Repeat  this exercise 5-10 times with each hand. Straight fist Stand or sit with your arm, hand, and all five fingers pointed straight up. Make sure to keep your wrist straight during the exercise. Gently bend your fingers at the big knuckle, where your fingers meet your hand, and the middle knuckle. Keep the knuckle at the tips of your fingers straight and try to touch the bottom of your palm. Hold this position for __________ seconds. Extend your fingers back to the starting position, stretching every joint fully. Repeat this exercise 5-10 times with each hand. Tabletop  Stand or sit with your arm, hand, and all five fingers pointed straight up. Make sure to keep your wrist straight during the exercise. Gently bend your fingers at the big knuckle, where your fingers meet your hand, as far down as you can while keeping the small knuckles in your fingers straight. Think of forming a tabletop with your fingers. Hold this position for __________ seconds. Extend your fingers back to the starting position, stretching every joint fully. Repeat this exercise 5-10 times with each hand. Finger spread  Place your hand flat on a table with your palm facing down. Make sure your wrist stays straight as you do this exercise. Spread your fingers and thumb apart from each other as far as you can until you feel a gentle stretch. Hold this position for __________ seconds. Bring your fingers and thumb tight together again. Hold this position for __________ seconds. Repeat this exercise 5-10 times with each hand. Making circles  Stand or sit with your arm, hand, and all five fingers pointed   straight up. Make sure to keep your wrist straight during the exercise. Make a circle by touching the tip of your thumb to the tip of your index finger. Hold for __________ seconds. Then open your hand wide. Repeat this motion with your thumb and each finger on your hand. Repeat this exercise 5-10 times with each hand. Thumb  motion  Sit with your forearm resting on a table and your wrist straight. Your thumb should be facing up toward the ceiling. Keep your fingers relaxed as you move your thumb. Lift your thumb up as high as you can toward the ceiling. Hold for __________ seconds. Bend your thumb across your palm as far as you can, reaching the tip of your thumb for the small finger (pinkie) side of your palm. Hold for __________ seconds. Repeat this exercise 5-10 times with each hand. Grip strengthening  Hold a stress ball or other soft ball in the middle of your hand. Slowly increase the pressure, squeezing the ball as much as you can without causing pain. Think of bringing the tips of your fingers into the middle of your palm. All of your finger joints should bend when doing this exercise. Hold your squeeze for __________ seconds, then relax. Repeat this exercise 5-10 times with each hand. Contact a health care provider if: Your hand pain or discomfort gets much worse when you do an exercise. Your hand pain or discomfort does not improve within 2 hours after you exercise. If you have any of these problems, stop doing these exercises right away. Do not do them again unless your health care provider says that you can. Get help right away if: You develop sudden, severe hand pain or swelling. If this happens, stop doing these exercises right away. Do not do them again unless your health care provider says that you can. This information is not intended to replace advice given to you by your health care provider. Make sure you discuss any questions you have with your health care provider. Document Revised: 05/26/2020 Document Reviewed: 06/02/2020 Elsevier Patient Education  2023 [employer] Iliotibial Band Syndrome Rehab Ask your health care provider which exercises are safe for you. Do exercises exactly as told by your health care provider and adjust them as directed. It is normal to feel mild stretching,  pulling, tightness, or discomfort as you do these exercises. Stop right away if you feel sudden pain or your pain gets significantly worse. Do not begin these exercises until told by your health care provider. Stretching and range-of-motion exercises These exercises warm up your muscles and joints and improve the movement and flexibility of your hip and pelvis. Quadriceps stretch, prone  Lie on your abdomen (prone position) on a firm surface, such as a bed or padded floor. Bend your left / right knee and reach back to hold your ankle or pant leg. If you cannot reach your ankle or pant leg, loop a belt around your foot and grab the belt instead. Gently pull your heel toward your buttocks. Your knee should not slide out to the side. You should feel a stretch in the front of your thigh and knee (quadriceps). Hold this position for __________ seconds. Repeat __________ times. Complete this exercise __________ times a day. Iliotibial band stretch An iliotibial band is a strong band of muscle tissue that runs from the outer side of your hip to the outer side of your thigh and knee. Lie on your side with your left / right leg in the top  position. Bend both of your knees and grab your left / right ankle. Stretch out your bottom arm to help you balance. Slowly bring your top knee back so your thigh goes behind your trunk. Slowly lower your top leg toward the floor until you feel a gentle stretch on the outside of your left / right hip and thigh. If you do not feel a stretch and your knee will not fall farther, place the heel of your other foot on top of your knee and pull your knee down toward the floor with your foot. Hold this position for __________ seconds. Repeat __________ times. Complete this exercise __________ times a day. Strengthening exercises These exercises build strength and endurance in your hip and pelvis. Endurance is the ability to use your muscles for a long time, even after they get  tired. Straight leg raises, side-lying This exercise strengthens the muscles that rotate the leg at the hip and move it away from your body (hip abductors). Lie on your side with your left / right leg in the top position. Lie so your head, shoulder, hip, and knee line up. You may bend your bottom knee to help you balance. Roll your hips slightly forward so your hips are stacked directly over each other and your left / right knee is facing forward. Tense the muscles in your outer thigh and lift your top leg 4-6 inches (10-15 cm). Hold this position for __________ seconds. Slowly lower your leg to return to the starting position. Let your muscles relax completely before doing another repetition. Repeat __________ times. Complete this exercise __________ times a day. Leg raises, prone This exercise strengthens the muscles that move the hips backward (hip extensors). Lie on your abdomen (prone position) on your bed or a firm surface. You can put a pillow under your hips if that is more comfortable for your lower back. Bend your left / right knee so your foot is straight up in the air. Squeeze your buttocks muscles and lift your left / right thigh off the bed. Do not let your back arch. Tense your thigh muscle as hard as you can without increasing any knee pain. Hold this position for __________ seconds. Slowly lower your leg to return to the starting position and allow it to relax completely. Repeat __________ times. Complete this exercise __________ times a day. Hip hike Stand sideways on a bottom step. Stand on your left / right leg with your other foot unsupported next to the step. You can hold on to a railing or wall for balance if needed. Keep your knees straight and your torso square. Then lift your left / right hip up toward the ceiling. Slowly let your left / right hip lower toward the floor, past the starting position. Your foot should get closer to the floor. Do not lean or bend your  knees. Repeat __________ times. Complete this exercise __________ times a day. This information is not intended to replace advice given to you by your health care provider. Make sure you discuss any questions you have with your health care provider. Document Revised: 04/22/2019 Document Reviewed: 04/22/2019 Elsevier Patient Education  [place].

## 2022-06-07 ENCOUNTER — Ambulatory Visit (INDEPENDENT_AMBULATORY_CARE_PROVIDER_SITE_OTHER): Payer: Medicare Other | Admitting: Family Medicine

## 2022-06-07 ENCOUNTER — Encounter: Payer: Self-pay | Admitting: Family Medicine

## 2022-06-07 VITALS — BP 131/74 | HR 68 | Temp 98.1°F | Wt 113.6 lb

## 2022-06-07 DIAGNOSIS — E559 Vitamin D deficiency, unspecified: Secondary | ICD-10-CM | POA: Diagnosis not present

## 2022-06-07 DIAGNOSIS — D649 Anemia, unspecified: Secondary | ICD-10-CM

## 2022-06-07 DIAGNOSIS — F430 Acute stress reaction: Secondary | ICD-10-CM | POA: Insufficient documentation

## 2022-06-07 DIAGNOSIS — E039 Hypothyroidism, unspecified: Secondary | ICD-10-CM | POA: Diagnosis not present

## 2022-06-07 DIAGNOSIS — R5383 Other fatigue: Secondary | ICD-10-CM | POA: Diagnosis not present

## 2022-06-07 LAB — IBC + FERRITIN
Ferritin: 27 ng/mL (ref 10.0–291.0)
Iron: 140 ug/dL (ref 42–145)
Saturation Ratios: 38.3 % (ref 20.0–50.0)
TIBC: 365.4 ug/dL (ref 250.0–450.0)
Transferrin: 261 mg/dL (ref 212.0–360.0)

## 2022-06-07 LAB — TSH: TSH: 36.03 u[IU]/mL — ABNORMAL HIGH (ref 0.35–5.50)

## 2022-06-07 LAB — B12 AND FOLATE PANEL
Folate: 23.6 ng/mL (ref >5.9)
Vitamin B-12: >1500 pg/mL — ABNORMAL HIGH (ref 211–911)

## 2022-06-07 LAB — T3, FREE: T3, Free: 2.2 pg/mL — ABNORMAL LOW (ref 2.3–4.2)

## 2022-06-07 LAB — VITAMIN D 25 HYDROXY (VIT D DEFICIENCY, FRACTURES): VITD: 30.46 ng/mL (ref 30.00–100.00)

## 2022-06-07 LAB — T4, FREE: Free T4: 0.57 ng/dL — ABNORMAL LOW (ref 0.60–1.60)

## 2022-06-07 MED ORDER — ESCITALOPRAM OXALATE 5 MG PO TABS: 5.0000 mg | ORAL_TABLET | Freq: Every day | ORAL | 1 refills | Status: DC

## 2022-06-07 NOTE — Progress Notes (Signed)
Cynthia Powell , 04/02/1945, 77 y.o., female MRN: [id number] Patient Care Team    Relationship Specialty Notifications Start End  Natalia LeatherwoodKuneff, Arlethia Basso A, DO PCP - General Family Medicine  03/02/15   Marlene Basthurmond, George S  Optometry  09/30/18   Pc, [facility]  Audiology  09/30/18   Sydnee CabalShearin, Mary D., MD Referring Physician Gastroenterology  10/11/21     Chief Complaint  Patient presents with   Fatigue    Took meds over 2 hours ago.     Subjective: Cynthia Powell is a 77 y.o. female present for FATIGUE Presents today to discuss fatigue.  She states yesterday she had a good day as far as her energy level goes.  Today however she is very fatigued.  She reports yesterday it was very stressful secondary to an animal attack on her cat. Patient has a history of hypothyroidism and the last few labs have shown oversupplementation.  Last decrease was to levo 25 mcg daily, she is overdue for follow-up lab.  She did have a thyroid ultrasound recently which was normal without nodules. She has a great deal of stress in her life, raising her granddaughter and the family dynamics surrounding. History of vitamin D deficiency.  She is taking vitamin D and B12 supplements.       05/16/2022    2:11 PM 02/02/2022    3:01 PM 10/11/2021    1:21 PM 05/12/2021    5:50 PM 05/10/2021    1:57 PM  Depression screen PHQ 2/9  Decreased Interest 0 0 0 0 0  Down, Depressed, Hopeless 0 0 0 0 0  PHQ - 2 Score 0 0 0 0 0    Allergies  Allergen Reactions   Iodinated Contrast Media     Other reaction(s): UNCONSCIOUSNESS   Spinach    Corn-Containing Products Diarrhea   Fish Allergy Diarrhea    Pt c/o Bluefin tuna and trout causing diarrhea.   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   Hyperlipidemia    using Krill oil; refuses statin   Hypertension    Murmur, cardiac 02/02/2022   Osteoporosis    Sensory hearing loss, bilateral 2017   Bilateral hearing aids. [facility]   Snoring 09/27/2020   Spider veins of both lower extremities 09/12/2015   Thyroid disease 1980   Trigger finger    Wears hearing aid in both ears    Past Surgical History:  Procedure Laterality Date   BREAST SURGERY  1990   biopsy  CESAREAN SECTION     x2   COLONOSCOPY  05/16/2021   COLPOSCOPY     EYE SURGERY  cataracts removed 2015   TONSILLECTOMY  1954   TUBAL LIGATION  1973   Family History  Problem Relation Age of Onset   Arthritis Mother    Diabetes Mother    Stroke Mother        dementia after   Hypertension Mother    Fibromyalgia Mother    Varicose Veins Mother    Heart disease Father    Hypertension Father    Colon cancer Father        mets liver   Pernicious anemia Father    Cancer Father    Breast cancer Sister        breast cancer   Hearing loss Maternal Grandfather    Melanoma Daughter        melanoma 2009   Diabetes Daughter    ADD / ADHD Daughter    Anxiety disorder Daughter    Learning disabilities Daughter    Lymphoma Daughter        Brain mets- on chemo ([facility])   OCD Daughter    Mental illness Daughter    Anxiety disorder Daughter    Asthma Daughter    Cancer Daughter    ADD / ADHD Daughter    Cancer Daughter    Diabetes Daughter     Learning disabilities Daughter    Allergies as of 06/07/2022       Reactions   Iodinated Contrast Media    Other reaction(s): UNCONSCIOUSNESS   Spinach    Corn-containing Products Diarrhea   Fish Allergy Diarrhea   Pt c/o Bluefin tuna and trout causing diarrhea.   Rice Diarrhea        Medication List        Accurate as of June 07, 2022  1:15 PM. If you have any questions, ask your nurse or doctor.          amLODipine 2.5 MG tablet Commonly known as: NORVASC Take 1 tablet (2.5 mg total) by mouth daily.   aspirin EC 81 MG tablet Take 1 tablet (81 mg total) by mouth daily. Swallow whole.   atorvastatin 10 MG tablet Commonly known as: LIPITOR Take 1 tablet (10 mg total) by mouth daily.   cholecalciferol 1000 units tablet Commonly known as: VITAMIN D Take 5,000 Units by mouth once a week.   escitalopram 5 MG tablet Commonly known as: Lexapro Take 1 tablet (5 mg total) by mouth daily. Started by: Felix Pacini, DO   levothyroxine 25 MCG tablet Commonly known as: SYNTHROID Take 1 tablet (25 mcg total) by mouth daily before breakfast.   lisinopril 40 MG tablet Commonly known as: ZESTRIL Take 1 tablet (40 mg total) by mouth daily.   VITAMIN B 12 PO Take 1 tablet by mouth daily.        All past medical history, surgical history, allergies, family history, immunizations andmedications were updated in the EMR today and reviewed under the history and medication portions of their EMR.     ROS: Negative, with the exception of above mentioned in HPI   Objective:  BP 131/74   Pulse 68   Temp 98.1 F (36.7 C)   Wt 113 lb 9.6 oz (51.5 kg)   SpO2 99%   BMI 22.19 kg/m  Body mass index is 22.19 kg/m. Physical Exam Vitals and nursing note reviewed.  Constitutional:      General: She is not  in acute distress.    Appearance: Normal appearance. She is not ill-appearing, toxic-appearing or diaphoretic.  HENT:     Head: Normocephalic and atraumatic.  Eyes:      General: No scleral icterus.       Right eye: No discharge.        Left eye: No discharge.     Extraocular Movements: Extraocular movements intact.     Conjunctiva/sclera: Conjunctivae normal.     Pupils: Pupils are equal, round, and reactive to light.  Cardiovascular:     Rate and Rhythm: Normal rate and regular rhythm.     Heart sounds: Murmur heard.  Pulmonary:     Effort: Pulmonary effort is normal. No respiratory distress.     Breath sounds: Normal breath sounds. No wheezing, rhonchi or rales.  Musculoskeletal:     Right lower leg: No edema.     Left lower leg: No edema.  Skin:    General: Skin is warm.     Findings: No rash.  Neurological:     Mental Status: She is alert and oriented to person, place, and time. Mental status is at baseline.     Motor: No weakness.     Gait: Gait normal.  Psychiatric:        Mood and Affect: Mood normal.        Behavior: Behavior normal.        Thought Content: Thought content normal.        Judgment: Judgment normal.     No results found. No results found. No results found for this or any previous visit (from the past 24 hour(s)).   Assessment/Plan: Cynthia Powell is a 77 y.o. female present for OV for  Hypothyroidism, unspecified type -She reports compliance with levo to 25 mcg daily.  Labs recollected today.  There has been question of her not taking the right dose of levo in the past and accidentally continuing the higher doses. Thyroid ultrasound: Within normal limits -TSH, T4 and 3 free collected today  Fatigue: Possibly multifactorial.  We discussed vitamin deficiencies, iron deficiencies, thyroid related and stress related. TSH, T3 free and T4 free collected today. Vitamin D, B12 and iron panel also collected today.  Stress: We discussed stress reaction as the causes her intermittent fatigue. She is agreeable to try medication today. Lexapro 5 mg daily prescribed.  Follow-up depend upon laboratory results.   Reviewed  expectations re: course of current medical issues. Discussed self-management of symptoms. Outlined signs and symptoms indicating need for more acute intervention. Patient verbalized understanding and all questions were answered. Patient received an After-Visit Summary.    Orders Placed This Encounter  Procedures   TSH   T4, free   T3, free   Vitamin D (25 hydroxy)   IBC + Ferritin   B12 and Folate Panel    Meds ordered this encounter  Medications   escitalopram (LEXAPRO) 5 MG tablet    Sig: Take 1 tablet (5 mg total) by mouth daily.    Dispense:  90 tablet    Refill:  1    Referral Orders  No referral(s) requested today     Note is dictated utilizing voice recognition software. Although note has been proof read prior to signing, occasional typographical errors still can be missed. If any questions arise, please do not hesitate to call for verification.   electronically signed by:  Felix Pacini, DO  [facility] [place]

## 2022-06-08 ENCOUNTER — Telehealth: Payer: Self-pay | Admitting: Family Medicine

## 2022-06-08 DIAGNOSIS — E039 Hypothyroidism, unspecified: Secondary | ICD-10-CM

## 2022-06-08 DIAGNOSIS — R7989 Other specified abnormal findings of blood chemistry: Secondary | ICD-10-CM

## 2022-06-08 MED ORDER — LEVOTHYROXINE SODIUM 50 MCG PO TABS: 50.0000 ug | ORAL_TABLET | Freq: Every day | ORAL | 0 refills | Status: DC

## 2022-06-08 NOTE — Telephone Encounter (Signed)
Provided lab results and recommendations per Dr. Claiborne Billings. Patient understood and is scheduled for her lab visit 07/27/22 at 1 pm.

## 2022-06-08 NOTE — Telephone Encounter (Signed)
Call patient: Vitamin D is normal.  B12 is normal.  Iron is normal Continue B12 and vitamin D supplements as currently taking.  Does not need to start an iron supplement since it is normal.   Her thyroid is now under supplemented.  I have increased her dose to 50 mcg a day.  She will be getting a new prescription of levothyroxine 50 mg, this was called into her mail-in pharmacy   While she waits on her new prescription, she can take 2 tabs of the levothyroxine 25 mcg she currently has at home.  Please make her a lab appointment only in 7-8 weeks for thyroid recheck.   Her thyroid being off, can certainly be the some of the cause of her fatigue. If she asks, yes I still recommend she start the escitalopram based on our discussion during her visit.

## 2022-06-08 NOTE — Telephone Encounter (Signed)
LVM for pt to return call regarding results.

## 2022-06-10 ENCOUNTER — Other Ambulatory Visit: Payer: Self-pay | Admitting: Family Medicine

## 2022-06-11 ENCOUNTER — Telehealth: Payer: Self-pay | Admitting: Family Medicine

## 2022-06-11 NOTE — Telephone Encounter (Signed)
Pt advised refill sent on 4/12, she should be receiving the new rx within the next week from [facility].

## 2022-06-11 NOTE — Telephone Encounter (Signed)
Pt put in request for refill on her levothyroxine (SYNTHROID) 50 MCG tablet  to be filled by [facility]. However, it was denied when she put in the 50 MCG because she was previously taking 25 MCG and was told to double it by Dr. Claiborne Billings. Patient ask that we contact her pharmacy to ensure her medication is sent to her correctly.

## 2022-06-12 ENCOUNTER — Encounter: Payer: Self-pay | Admitting: Family Medicine

## 2022-07-27 ENCOUNTER — Other Ambulatory Visit (INDEPENDENT_AMBULATORY_CARE_PROVIDER_SITE_OTHER): Payer: Medicare Other

## 2022-07-27 ENCOUNTER — Encounter: Payer: Self-pay | Admitting: Family Medicine

## 2022-07-27 DIAGNOSIS — E039 Hypothyroidism, unspecified: Secondary | ICD-10-CM

## 2022-07-27 DIAGNOSIS — R7989 Other specified abnormal findings of blood chemistry: Secondary | ICD-10-CM | POA: Diagnosis not present

## 2022-07-27 LAB — TSH: TSH: 5.2 u[IU]/mL (ref 0.35–5.50)

## 2022-07-30 ENCOUNTER — Other Ambulatory Visit: Payer: Self-pay | Admitting: Family Medicine

## 2022-07-30 MED ORDER — LEVOTHYROXINE SODIUM 50 MCG PO TABS: 50.0000 ug | ORAL_TABLET | Freq: Every day | ORAL | 3 refills | Status: DC

## 2022-09-08 ENCOUNTER — Other Ambulatory Visit: Payer: Self-pay | Admitting: Family Medicine

## 2022-09-12 ENCOUNTER — Ambulatory Visit (INDEPENDENT_AMBULATORY_CARE_PROVIDER_SITE_OTHER): Payer: Medicare Other | Admitting: Family Medicine

## 2022-09-12 ENCOUNTER — Encounter: Payer: Self-pay | Admitting: Family Medicine

## 2022-09-12 VITALS — BP 138/73 | HR 73 | Temp 97.9°F | Wt 108.0 lb

## 2022-09-12 DIAGNOSIS — Z8673 Personal history of transient ischemic attack (TIA), and cerebral infarction without residual deficits: Secondary | ICD-10-CM

## 2022-09-12 DIAGNOSIS — I6523 Occlusion and stenosis of bilateral carotid arteries: Secondary | ICD-10-CM

## 2022-09-12 DIAGNOSIS — F430 Acute stress reaction: Secondary | ICD-10-CM

## 2022-09-12 DIAGNOSIS — I10 Essential (primary) hypertension: Secondary | ICD-10-CM

## 2022-09-12 DIAGNOSIS — I350 Nonrheumatic aortic (valve) stenosis: Secondary | ICD-10-CM

## 2022-09-12 DIAGNOSIS — M810 Age-related osteoporosis without current pathological fracture: Secondary | ICD-10-CM

## 2022-09-12 DIAGNOSIS — E538 Deficiency of other specified B group vitamins: Secondary | ICD-10-CM | POA: Diagnosis not present

## 2022-09-12 DIAGNOSIS — E782 Mixed hyperlipidemia: Secondary | ICD-10-CM

## 2022-09-12 DIAGNOSIS — E559 Vitamin D deficiency, unspecified: Secondary | ICD-10-CM | POA: Diagnosis not present

## 2022-09-12 DIAGNOSIS — E038 Other specified hypothyroidism: Secondary | ICD-10-CM

## 2022-09-12 DIAGNOSIS — N1832 Chronic kidney disease, stage 3b: Secondary | ICD-10-CM

## 2022-09-12 DIAGNOSIS — I679 Cerebrovascular disease, unspecified: Secondary | ICD-10-CM

## 2022-09-12 DIAGNOSIS — E063 Autoimmune thyroiditis: Secondary | ICD-10-CM

## 2022-09-12 DIAGNOSIS — I340 Nonrheumatic mitral (valve) insufficiency: Secondary | ICD-10-CM

## 2022-09-12 MED ORDER — ATORVASTATIN CALCIUM 10 MG PO TABS: 10.0000 mg | ORAL_TABLET | Freq: Every day | ORAL | 3 refills | Status: DC

## 2022-09-12 MED ORDER — LISINOPRIL 40 MG PO TABS: 40.0000 mg | ORAL_TABLET | Freq: Every day | ORAL | 3 refills | Status: DC

## 2022-09-12 NOTE — Patient Instructions (Addendum)
Return in about 24 weeks (around 02/27/2023) for Routine chronic condition follow-up.        Great to see you today.  I have refilled the medication(s) we provide.   If labs were collected, we will inform you of lab results once received either by echart message or telephone call.   - echart message- for normal results that have been seen by the patient already.   - telephone call: abnormal results or if patient has not viewed results in their echart.

## 2022-09-12 NOTE — Progress Notes (Signed)
Cynthia Powell , 1946-02-26, 77 y.o., female MRN: [id number] Patient Care Team    Relationship Specialty Notifications Start End  Natalia Leatherwood, DO PCP - General Family Medicine  03/02/15   Marlene Bast  Optometry  09/30/18   [facility], [facility]  Audiology  09/30/18   Sydnee Cabal., MD Referring Physician Gastroenterology  10/11/21     Chief Complaint  Patient presents with   Hypertension    Follow up on BP. She has memory concerns     Subjective: Cynthia Powell is a 77 y.o. female present for [facility] Hypertension/hyperlipidemia-/CVA/TIA:Patient reports compliance with lisinopril 40 mg QD and amlodipine 2.5 mg daily. Patient denies chest pain, shortness of breath, dizziness or lower extremity edema.   Pt does  take daily baby ASA.  She is now taking Lipitor 10 mg daily. Diet: does not closely monitor.  Exercise: does not routinely exercise.  RF: HTN, HLD, FHX stroke (mother), TIA, CVA  Hypothyroid:Pt reports compliance with 50 mcg synthroid daily on an empty stomach. TSH 5.2 (07/27/2022) on current dose   Vit d deficiency/osteoporosis: Pt continues to take 5000 u vit d once a week. She declined fosamax start      09/12/2022    1:32 PM 05/16/2022    2:11 PM 02/02/2022    3:01 PM 10/11/2021    1:21 PM 05/12/2021    5:50 PM  Depression screen PHQ 2/9  Decreased Interest 0 0 0 0 0  Down, Depressed, Hopeless 0 0 0 0 0  PHQ - 2 Score 0 0 0 0 0  Altered sleeping 2      Tired, decreased energy 3      Change in appetite 1      Feeling bad or failure about yourself  0      Trouble concentrating 1      Moving slowly or fidgety/restless 0      Suicidal thoughts 0      PHQ-9 Score 7      Difficult doing work/chores Somewhat difficult        Allergies  Allergen Reactions   Iodinated Contrast Media     Other reaction(s): UNCONSCIOUSNESS   Spinach    Corn-Containing Products Diarrhea   Fish Allergy Diarrhea    Pt c/o Bluefin tuna and trout causing diarrhea.    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   Allergy    seasonal and food   Anemia recently (2023)   Arthritis    osteoarthritis   Benign neoplasm of colon    Cancer (HCC) 1995   cervical (cone bx)   Cecal polyp 04/24/2021   Chronic kidney disease    stage 2   Family history of colon cancer 05/16/2021   Glaucoma    Herpes zoster 04/05/2020   A: given hx and physical findings as well as description of pain I do not think it is sciatica, pt also has vesicles on LE so may be that she is starting an outbreak P:--valtrex 1000 mg po tid x 7 d --fu if worsens or no better with meds   History of chickenpox    History of colon polyps 05/16/2021   History of  shingles    above her right knee, gets frequently.   Hyperlipidemia    using Krill oil; refuses statin   Hypertension    Murmur, cardiac 02/02/2022   Osteoporosis    Polyarthralgia 04/05/2020   Sensory hearing loss, bilateral 2017   Bilateral hearing aids. [facility]   Snoring 09/27/2020   Spider veins of both lower extremities 09/12/2015   Thyroid disease 1980   Trigger finger    Wears hearing aid in both ears    Past Surgical History:  Procedure Laterality Date   BREAST SURGERY  1990   biopsy   CESAREAN SECTION     x2   COLONOSCOPY  05/16/2021   COLPOSCOPY     EYE SURGERY  cataracts removed 2015   TONSILLECTOMY  1954   TUBAL LIGATION  1973   Family History  Problem Relation Age of Onset   Arthritis Mother    Diabetes Mother    Stroke Mother        dementia after   Hypertension Mother    Fibromyalgia Mother    Varicose Veins Mother    Heart disease Father 71   Hypertension Father    Colon cancer Father        mets liver   Pernicious anemia Father     Cancer Father    Breast cancer Sister        breast cancer   Hearing loss Maternal Grandfather    Melanoma Daughter        melanoma 2009   Diabetes Daughter    ADD / ADHD Daughter    Anxiety disorder Daughter    Learning disabilities Daughter    Lymphoma Daughter        Brain mets- on chemo ([facility])   OCD Daughter    Mental illness Daughter    Anxiety disorder Daughter    Asthma Daughter    Cancer Daughter    ADD / ADHD Daughter    Cancer Daughter    Diabetes Daughter    Learning disabilities Daughter    Allergies as of 09/12/2022       Reactions   Iodinated Contrast Media    Other reaction(s): UNCONSCIOUSNESS   Spinach    Corn-containing Products Diarrhea   Fish Allergy Diarrhea   Pt c/o Bluefin tuna and trout causing diarrhea.   Rice Diarrhea        Medication List        Accurate as of September 12, 2022  2:05 PM. If you have any questions, ask your nurse or doctor.          STOP taking these medications    escitalopram 5 MG tablet Commonly known as: Lexapro Stopped by: Felix Pacini       TAKE these medications    amLODipine 2.5 MG tablet Commonly known as: NORVASC Take 1 tablet (2.5 mg total) by mouth daily.   aspirin EC 81 MG tablet Take 1 tablet (81 mg total) by mouth daily. Swallow whole.   atorvastatin 10 MG tablet Commonly known as: LIPITOR Take 1 tablet (10 mg total) by mouth daily.   cholecalciferol 1000 units tablet Commonly known as: VITAMIN D Take 5,000 Units by mouth once a week.   levothyroxine 50 MCG tablet Commonly known as: SYNTHROID Take 1 tablet (50 mcg total) by mouth daily before breakfast.   lisinopril 40 MG tablet Commonly known as: ZESTRIL Take 1 tablet (40 mg total) by mouth daily.   VITAMIN B 12 PO Take 1 tablet by  mouth daily.        All past medical history, surgical history, allergies, family history, immunizations andmedications were updated in the EMR today and reviewed under the history and medication  portions of their EMR.     ROS: Negative, with the exception of above mentioned in HPI   Objective:  BP 138/73   Pulse 73   Temp 97.9 F (36.6 C) (Oral)   Wt 108 lb (49 kg)   SpO2 99%   BMI 21.09 kg/m  Body mass index is 21.09 kg/m. Physical Exam Vitals and nursing note reviewed.  Constitutional:      General: She is not in acute distress.    Appearance: Normal appearance. She is not ill-appearing, toxic-appearing or diaphoretic.  HENT:     Head: Normocephalic and atraumatic.  Eyes:     General: No scleral icterus.       Right eye: No discharge.        Left eye: No discharge.     Extraocular Movements: Extraocular movements intact.     Conjunctiva/sclera: Conjunctivae normal.     Pupils: Pupils are equal, round, and reactive to light.  Cardiovascular:     Rate and Rhythm: Normal rate and regular rhythm.     Heart sounds: Murmur heard.  Pulmonary:     Effort: Pulmonary effort is normal. No respiratory distress.     Breath sounds: Normal breath sounds. No wheezing, rhonchi or rales.  Musculoskeletal:     Right lower leg: No edema.     Left lower leg: No edema.  Skin:    General: Skin is warm.     Findings: No rash.  Neurological:     Mental Status: She is alert and oriented to person, place, and time. Mental status is at baseline.     Motor: No weakness.     Gait: Gait normal.  Psychiatric:        Mood and Affect: Mood normal.        Behavior: Behavior normal.        Thought Content: Thought content normal.        Judgment: Judgment normal.     No results found. No results found. No results found for this or any previous visit (from the past 24 hour(s)).   Assessment/Plan: SHERYLE VICE is a 77 y.o. female present for OV for  Hypothyroidism, unspecified type -Stable - Continue levothyroxine 50 mcg daily.  Stage 3b chronic kidney disease (HCC)/vitamin D deficiency/osteoporosis -Patient was strongly encouraged to increase her hydration.  She does not drink  water. -We will attempt to avoid long-term NSAIDs if possible. -GFR stable at 53 Vitamin D levels up-to-date  Hypertension/hyperlipidemia/TIA/h/o CVA: stable Continue lisinopril 40 mg QD. Continue amlodipine 2.5 mg daily-managed by cardiology. Continue Lipitor 10 mg daily Continue baby aspirin Labs up-to-date  Anxious/frustrated She decided she will start the lexapro 5 mg called in last visit.   Reviewed expectations re: course of current medical issues. Discussed self-management of symptoms. Outlined signs and symptoms indicating need for more acute intervention. Patient verbalized understanding and all questions were answered. Patient received an After-Visit Summary.    No orders of the defined types were placed in this encounter.   Meds ordered this encounter  Medications   atorvastatin (LIPITOR) 10 MG tablet    Sig: Take 1 tablet (10 mg total) by mouth daily.    Dispense:  90 tablet    Refill:  3   lisinopril (ZESTRIL) 40 MG tablet  Sig: Take 1 tablet (40 mg total) by mouth daily.    Dispense:  90 tablet    Refill:  3    Referral Orders  No referral(s) requested today     Note is dictated utilizing voice recognition software. Although note has been proof read prior to signing, occasional typographical errors still can be missed. If any questions arise, please do not hesitate to call for verification.   electronically signed by:  Felix Pacini, DO  [facility] [place]

## 2022-09-14 ENCOUNTER — Telehealth: Payer: Self-pay | Admitting: Family Medicine

## 2022-09-14 DIAGNOSIS — E038 Other specified hypothyroidism: Secondary | ICD-10-CM

## 2022-09-14 NOTE — Telephone Encounter (Signed)
Patient is calling to request a referral to Dr. Warden Fillers for thyroid concerns. He is located at [facility] in [place]. She reports that she discussed this with Dr. Claiborne Billings at her last appointment.

## 2022-09-14 NOTE — Addendum Note (Signed)
Addended by: Filomena Jungling on: 09/14/2022 03:21 PM   Modules accepted: Orders

## 2022-09-14 NOTE — Telephone Encounter (Signed)
Ok to refer her.

## 2022-09-26 DIAGNOSIS — H40053 Ocular hypertension, bilateral: Secondary | ICD-10-CM | POA: Diagnosis not present

## 2022-10-15 DIAGNOSIS — H35033 Hypertensive retinopathy, bilateral: Secondary | ICD-10-CM | POA: Diagnosis not present

## 2022-11-15 DIAGNOSIS — Z23 Encounter for immunization: Secondary | ICD-10-CM | POA: Diagnosis not present

## 2022-11-29 NOTE — Progress Notes (Deleted)
Office Visit Note  Patient: Cynthia Powell             Date of Birth: Aug 06, 1945           MRN: [id number]             PCP: Natalia Leatherwood, DO Referring: Natalia Leatherwood, DO Visit Date: 12/12/2022 Occupation: @GUAROCC @  Subjective:  No chief complaint on file.   History of Present Illness: Cynthia Powell is a 77 y.o. female ***     Activities of Daily Living:  Patient reports morning stiffness for *** {minute/hour:19697}.   Patient {ACTIONS;DENIES/REPORTS:21021675::"Denies"} nocturnal pain.  Difficulty dressing/grooming: {ACTIONS;DENIES/REPORTS:21021675::"Denies"} Difficulty climbing stairs: {ACTIONS;DENIES/REPORTS:21021675::"Denies"} Difficulty getting out of chair: {ACTIONS;DENIES/REPORTS:21021675::"Denies"} Difficulty using hands for taps, buttons, cutlery, and/or writing: {ACTIONS;DENIES/REPORTS:21021675::"Denies"}  No Rheumatology ROS completed.   PMFS History:  Patient Active Problem List   Diagnosis Date Noted  . Stress reaction 06/07/2022  . 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 Aortic stenosis: Only mild based on echocardiogram from 2023 02/27/2022  . Mitral regurgitation only mild based on echocardiogram 2023 02/27/2022  . Murmur, cardiac 02/02/2022  . Amaurosis fugax 02/02/2022  . Carotid artery calcification, bilateral- mild 05/01/2021  . Memory changes 09/27/2020  . Enthesopathy of hip region 04/05/2020  . B12 deficiency 04/05/2020  . CKD (chronic kidney disease) stage 3, GFR 30-59 ml/min (HCC) 11/11/2018  . Vitamin D deficiency 10/29/2017  . 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 Uses hearing aid 03/08/2015  . Osteoporosis 03/08/2015  . Hypothyroidism 03/02/2015  . Essential hypertension 03/02/2015  . Hyperlipidemia 03/02/2015  . 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/30/2010    Past Medical History:  Diagnosis Date  . Abnormal Pap  smear of vagina 1995,2007   pt had h/o abnl pap; does not desire future screening/PAP  . 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 Allergy    seasonal and food  . Anemia recently (2023)  . Arthritis    osteoarthritis  . Benign neoplasm of colon   . 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 Cecal polyp 04/24/2021  . Chronic kidney disease    stage 2  . Family history of colon cancer 05/16/2021  . Glaucoma   . Herpes zoster 04/05/2020   A: given hx and physical findings as well as description of pain I do not think it is sciatica, pt also has vesicles on LE so may be that she is starting an outbreak P:--valtrex 1000 mg po tid x 7 d --fu if worsens or no better with meds  . History of chickenpox   . History of colon polyps 05/16/2021  . History of shingles    above her right knee, gets frequently.  Marland Kitchen Hyperlipidemia    using Krill oil; refuses statin  . Hypertension   . Murmur, cardiac 02/02/2022  . Osteoporosis   . Polyarthralgia 04/05/2020  . Sensory hearing loss, bilateral 2017   Bilateral hearing aids. [facility]  . Snoring 09/27/2020  . Spider veins of both lower extremities 09/12/2015  . Thyroid disease 1980  . Trigger finger   . Wears hearing aid in both ears     Family History  Problem Relation Age of Onset  . Arthritis Mother   . Diabetes Mother   . Stroke Mother        dementia after  . Hypertension Mother   . Fibromyalgia Mother   .  Varicose Veins Mother   . Heart disease Father 73  . Hypertension Father   . Colon cancer Father        mets liver  . Pernicious anemia Father   . Cancer Father   . Breast cancer Sister        breast cancer  . Hearing loss Maternal Grandfather   . Melanoma Daughter        melanoma 2009  . Diabetes Daughter   . ADD / ADHD Daughter   . Anxiety disorder Daughter   . Learning disabilities Daughter   . Lymphoma Daughter        Brain mets- on chemo ([facility])  . OCD Daughter   . Mental  illness Daughter   . Anxiety disorder Daughter   . Asthma Daughter   . Cancer Daughter   . ADD / ADHD Daughter   . Cancer Daughter   . Diabetes Daughter   . Learning disabilities Daughter    Past Surgical History:  Procedure Laterality Date  . BREAST SURGERY  1990   biopsy  . CESAREAN SECTION     x2  . COLONOSCOPY  05/16/2021  . COLPOSCOPY    . EYE SURGERY  cataracts removed 2015  . TONSILLECTOMY  1954  . TUBAL LIGATION  1973   Social History   Social History Narrative  . Not on file   Immunization History  Administered Date(s) Administered  . Fluad Quad(high Dose 65+) 12/01/2018, 12/22/2021  . Influenza Split 12/26/2007, 10/21/2008, 11/15/2011  . Influenza Whole 01/01/2002  . Influenza, Quadrivalent, Recombinant, Inj, Pf 11/21/2017  . Influenza-Unspecified 01/31/2004, 02/10/2005, 11/20/2013, 10/20/2014, 11/22/2016, 11/29/2019, 11/13/2020  . MMR 09/23/2012, 10/22/2012  . PFIZER Comirnaty(Gray Top)Covid-19 Tri-Sucrose Vaccine 12/29/2021  . PFIZER(Purple Top)SARS-COV-2 Vaccination 03/21/2019, 04/18/2019, 12/25/2019, 07/04/2020, 11/09/2020  . Pneumococcal Conjugate-13 12/31/2011  . Pneumococcal Polysaccharide-23 11/05/2006, 01/28/2017  . Respiratory Syncytial Virus Vaccine,Recomb Aduvanted(Arexvy) 10/24/2021  . Td 01/01/2002  . Td (Adult), 2 Lf Tetanus Toxid, Preservative Free 01/01/2002  . Tdap 10/02/2018  . Zoster Recombinant(Shingrix) 10/02/2018, 12/10/2018  . Zoster, Live 06/25/2013     Objective: Vital Signs: There were no vitals taken for this visit.   Physical Exam   Musculoskeletal Exam: ***  CDAI Exam: CDAI Score: -- Patient Global: --; Provider Global: -- Swollen: --; Tender: -- Joint Exam 12/12/2022   No joint exam has been documented for this visit   There is currently no information documented on the homunculus. Go to the Rheumatology activity and complete the homunculus joint exam.  Investigation: No additional findings.  Imaging: No  results found.  Recent Labs: Lab Results  Component Value Date   WBC 8.1 02/02/2022   HGB 12.1 02/02/2022   PLT 197 02/02/2022   NA 144 02/02/2022   K 4.0 02/02/2022   CL 107 02/02/2022   CO2 28 02/02/2022   GLUCOSE 93 02/02/2022   BUN 21 02/02/2022   CREATININE 0.69 02/02/2022   BILITOT 0.4 02/02/2022   ALKPHOS 43 04/30/2018   AST 19 02/02/2022   ALT 15 02/02/2022   PROT 6.7 02/02/2022   ALBUMIN 4.6 04/30/2018   CALCIUM 9.4 02/02/2022    Speciality Comments: No specialty comments available.  Procedures:  No procedures performed Allergies: Iodinated contrast media, Spinach, Corn-containing products, Fish allergy, and Rice   Assessment / Plan:     Visit Diagnoses: No diagnosis found.  Orders: No orders of the defined types were placed in this encounter.  No orders of the defined types were placed in this encounter.  Face-to-face time spent with patient was *** minutes. Greater than 50% of time was spent in counseling and coordination of care.  Follow-Up Instructions: No follow-ups on file.   Ellen Henri, CMA  Note - This record has been created using Animal nutritionist.  Chart creation errors have been sought, but may not always  have been located. Such creation errors do not reflect on  the standard of medical care.

## 2022-12-03 DIAGNOSIS — E785 Hyperlipidemia, unspecified: Secondary | ICD-10-CM | POA: Diagnosis not present

## 2022-12-03 DIAGNOSIS — M810 Age-related osteoporosis without current pathological fracture: Secondary | ICD-10-CM | POA: Diagnosis not present

## 2022-12-03 DIAGNOSIS — E063 Autoimmune thyroiditis: Secondary | ICD-10-CM | POA: Diagnosis not present

## 2022-12-03 DIAGNOSIS — Z1331 Encounter for screening for depression: Secondary | ICD-10-CM | POA: Diagnosis not present

## 2022-12-03 DIAGNOSIS — I10 Essential (primary) hypertension: Secondary | ICD-10-CM | POA: Diagnosis not present

## 2022-12-04 LAB — TSH: TSH: 3.3 (ref 0.41–5.90)

## 2022-12-07 LAB — T4, FREE: Free T4: 1.57

## 2022-12-10 NOTE — Progress Notes (Unsigned)
Office Visit Note  Patient: Cynthia Powell             Date of Birth: 30-Jan-1946           MRN: [id number]             PCP: Natalia Leatherwood, DO Referring: Natalia Leatherwood, DO Visit Date: 12/11/2022 Occupation: @GUAROCC @  Subjective:  No chief complaint on file.   History of Present Illness: Cynthia Powell is a 77 y.o. female ***     Activities of Daily Living:  Patient reports morning stiffness for *** {minute/hour:19697}.   Patient {ACTIONS;DENIES/REPORTS:21021675::"Denies"} nocturnal pain.  Difficulty dressing/grooming: {ACTIONS;DENIES/REPORTS:21021675::"Denies"} Difficulty climbing stairs: {ACTIONS;DENIES/REPORTS:21021675::"Denies"} Difficulty getting out of chair: {ACTIONS;DENIES/REPORTS:21021675::"Denies"} Difficulty using hands for taps, buttons, cutlery, and/or writing: {ACTIONS;DENIES/REPORTS:21021675::"Denies"}  No Rheumatology ROS completed.   PMFS History:  Patient Active Problem List   Diagnosis Date Noted   Stress reaction 06/07/2022   Cerebrovascular small vessel disease 03/07/2022   History of CVA (cerebrovascular accident)- right lentiform and corona radiata 03/07/2022   Aortic stenosis: Only mild based on echocardiogram from 2023 02/27/2022   Mitral regurgitation only mild based on echocardiogram 2023 02/27/2022   Murmur, cardiac 02/02/2022   Amaurosis fugax 02/02/2022   Carotid artery calcification, bilateral- mild 05/01/2021   Memory changes 09/27/2020   Enthesopathy of hip region 04/05/2020   B12 deficiency 04/05/2020   CKD (chronic kidney disease) stage 3, GFR 30-59 ml/min (HCC) 11/11/2018   Vitamin D deficiency 10/29/2017   Sensory hearing loss, 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   Uses hearing aid 03/08/2015   Osteoporosis 03/08/2015   Hypothyroidism 03/02/2015   Essential hypertension 03/02/2015   Hyperlipidemia 03/02/2015   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/30/2010    Past Medical History:  Diagnosis Date   Abnormal Pap smear of vagina  [id number]   pt had h/o abnl pap; does not desire future screening/PAP   ADHD (attention deficit hyperactivity disorder), 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   Allergy    seasonal and food   Anemia recently (2023)   Arthritis    osteoarthritis   Benign neoplasm of colon    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   Hyperlipidemia    using Krill oil; refuses statin   Hypertension    Murmur, cardiac 02/02/2022   Osteoporosis    Polyarthralgia 04/05/2020   Sensory hearing loss, bilateral 2017   Bilateral hearing aids. [facility]   Snoring 09/27/2020   Spider veins of both lower extremities 09/12/2015   Thyroid disease 1980   Trigger finger    Wears hearing aid in both ears     Family History  Problem Relation Age of Onset   Arthritis Mother    Diabetes Mother    Stroke Mother        dementia after   Hypertension Mother    Fibromyalgia Mother  Varicose Veins Mother    Heart disease Father 45   Hypertension Father    Colon cancer Father        mets liver   Pernicious anemia Father    Cancer Father    Breast cancer Sister        breast cancer   Hearing loss Maternal Grandfather    Melanoma Daughter        melanoma 2009   Diabetes Daughter    ADD / ADHD Daughter    Anxiety disorder Daughter    Learning disabilities Daughter    Lymphoma Daughter        Brain mets- on chemo ([facility])   OCD Daughter    Mental illness Daughter    Anxiety disorder Daughter    Asthma  Daughter    Cancer Daughter    ADD / ADHD Daughter    Cancer Daughter    Diabetes Daughter    Learning disabilities Daughter    Past Surgical History:  Procedure Laterality Date   BREAST SURGERY  1990   biopsy   CESAREAN SECTION     x2   COLONOSCOPY  05/16/2021   COLPOSCOPY     EYE SURGERY  cataracts removed 2015   TONSILLECTOMY  1954   TUBAL LIGATION  1973   Social History   Social History Narrative   Not on file   Immunization History  Administered Date(s) Administered   Fluad Quad(high Dose 65+) 12/01/2018, 12/22/2021   Influenza Split 12/26/2007, 10/21/2008, 11/15/2011   Influenza Whole 01/01/2002   Influenza, Quadrivalent, Recombinant, Inj, Pf 11/21/2017   Influenza-Unspecified 01/31/2004, 02/10/2005, 11/20/2013, 10/20/2014, 11/22/2016, 11/29/2019, 11/13/2020   MMR 09/23/2012, 10/22/2012   PFIZER Comirnaty(Gray Top)Covid-19 Tri-Sucrose Vaccine 12/29/2021   PFIZER(Purple Top)SARS-COV-2 Vaccination 03/21/2019, 04/18/2019, 12/25/2019, 07/04/2020, 11/09/2020   Pneumococcal Conjugate-13 12/31/2011   Pneumococcal Polysaccharide-23 11/05/2006, 01/28/2017   Respiratory Syncytial Virus Vaccine,Recomb Aduvanted(Arexvy) 10/24/2021   Td 01/01/2002   Td (Adult), 2 Lf Tetanus Toxid, Preservative Free 01/01/2002   Tdap 10/02/2018   Zoster Recombinant(Shingrix) 10/02/2018, 12/10/2018   Zoster, Live 06/25/2013     Objective: Vital Signs: There were no vitals taken for this visit.   Physical Exam   Musculoskeletal Exam: ***  CDAI Exam: CDAI Score: -- Patient Global: --; Provider Global: -- Swollen: --; Tender: -- Joint Exam 12/11/2022   No joint exam has been documented for this visit   There is currently no information documented on the homunculus. Go to the Rheumatology activity and complete the homunculus joint exam.  Investigation: No additional findings.  Imaging: No results found.  Recent Labs: Lab Results  Component Value Date   WBC 8.1 02/02/2022    HGB 12.1 02/02/2022   PLT 197 02/02/2022   NA 144 02/02/2022   K 4.0 02/02/2022   CL 107 02/02/2022   CO2 28 02/02/2022   GLUCOSE 93 02/02/2022   BUN 21 02/02/2022   CREATININE 0.69 02/02/2022   BILITOT 0.4 02/02/2022   ALKPHOS 43 04/30/2018   AST 19 02/02/2022   ALT 15 02/02/2022   PROT 6.7 02/02/2022   ALBUMIN 4.6 04/30/2018   CALCIUM 9.4 02/02/2022    Speciality Comments: No specialty comments available.  Procedures:  No procedures performed Allergies: Iodinated contrast media, Spinach, Corn-containing products, Fish allergy, and Rice   Assessment / Plan:     Visit Diagnoses: No diagnosis found.  Orders: No orders of the defined types were placed in this encounter.  No orders of the defined types were placed in this encounter.  Face-to-face time spent with patient was *** minutes. Greater than 50% of time was spent in counseling and coordination of care.  Follow-Up Instructions: No follow-ups on file.   Ellen Henri, CMA  Note - This record has been created using Animal nutritionist.  Chart creation errors have been sought, but may not always  have been located. Such creation errors do not reflect on  the standard of medical care.

## 2022-12-11 ENCOUNTER — Ambulatory Visit: Payer: Medicare Other | Attending: Rheumatology | Admitting: Rheumatology

## 2022-12-11 ENCOUNTER — Encounter: Payer: Self-pay | Admitting: Rheumatology

## 2022-12-11 VITALS — BP 109/65 | HR 63 | Resp 13 | Ht 60.5 in | Wt 102.6 lb

## 2022-12-11 DIAGNOSIS — M810 Age-related osteoporosis without current pathological fracture: Secondary | ICD-10-CM | POA: Insufficient documentation

## 2022-12-11 DIAGNOSIS — H903 Sensorineural hearing loss, bilateral: Secondary | ICD-10-CM | POA: Diagnosis not present

## 2022-12-11 DIAGNOSIS — R5383 Other fatigue: Secondary | ICD-10-CM | POA: Diagnosis not present

## 2022-12-11 DIAGNOSIS — Z8639 Personal history of other endocrine, nutritional and metabolic disease: Secondary | ICD-10-CM | POA: Diagnosis not present

## 2022-12-11 DIAGNOSIS — R768 Other specified abnormal immunological findings in serum: Secondary | ICD-10-CM | POA: Diagnosis not present

## 2022-12-11 DIAGNOSIS — M7061 Trochanteric bursitis, right hip: Secondary | ICD-10-CM | POA: Insufficient documentation

## 2022-12-11 DIAGNOSIS — M19041 Primary osteoarthritis, right hand: Secondary | ICD-10-CM | POA: Diagnosis not present

## 2022-12-11 DIAGNOSIS — I8393 Asymptomatic varicose veins of bilateral lower extremities: Secondary | ICD-10-CM | POA: Insufficient documentation

## 2022-12-11 DIAGNOSIS — L280 Lichen simplex chronicus: Secondary | ICD-10-CM | POA: Diagnosis not present

## 2022-12-11 DIAGNOSIS — Z8669 Personal history of other diseases of the nervous system and sense organs: Secondary | ICD-10-CM | POA: Diagnosis not present

## 2022-12-11 DIAGNOSIS — E538 Deficiency of other specified B group vitamins: Secondary | ICD-10-CM | POA: Insufficient documentation

## 2022-12-11 DIAGNOSIS — E559 Vitamin D deficiency, unspecified: Secondary | ICD-10-CM | POA: Insufficient documentation

## 2022-12-11 DIAGNOSIS — M19042 Primary osteoarthritis, left hand: Secondary | ICD-10-CM | POA: Insufficient documentation

## 2022-12-11 DIAGNOSIS — G453 Amaurosis fugax: Secondary | ICD-10-CM | POA: Insufficient documentation

## 2022-12-11 DIAGNOSIS — I10 Essential (primary) hypertension: Secondary | ICD-10-CM | POA: Diagnosis not present

## 2022-12-11 DIAGNOSIS — F439 Reaction to severe stress, unspecified: Secondary | ICD-10-CM | POA: Insufficient documentation

## 2022-12-12 ENCOUNTER — Ambulatory Visit: Payer: Medicare Other | Admitting: Rheumatology

## 2022-12-12 DIAGNOSIS — M810 Age-related osteoporosis without current pathological fracture: Secondary | ICD-10-CM

## 2022-12-12 DIAGNOSIS — I10 Essential (primary) hypertension: Secondary | ICD-10-CM

## 2022-12-12 DIAGNOSIS — R5383 Other fatigue: Secondary | ICD-10-CM

## 2022-12-12 DIAGNOSIS — G453 Amaurosis fugax: Secondary | ICD-10-CM

## 2022-12-12 DIAGNOSIS — L280 Lichen simplex chronicus: Secondary | ICD-10-CM

## 2022-12-12 DIAGNOSIS — E559 Vitamin D deficiency, unspecified: Secondary | ICD-10-CM

## 2022-12-12 DIAGNOSIS — E538 Deficiency of other specified B group vitamins: Secondary | ICD-10-CM

## 2022-12-12 DIAGNOSIS — R768 Other specified abnormal immunological findings in serum: Secondary | ICD-10-CM

## 2022-12-12 DIAGNOSIS — M19041 Primary osteoarthritis, right hand: Secondary | ICD-10-CM

## 2022-12-12 DIAGNOSIS — Z8669 Personal history of other diseases of the nervous system and sense organs: Secondary | ICD-10-CM

## 2022-12-12 DIAGNOSIS — I8393 Asymptomatic varicose veins of bilateral lower extremities: Secondary | ICD-10-CM

## 2022-12-12 DIAGNOSIS — M7061 Trochanteric bursitis, right hip: Secondary | ICD-10-CM

## 2022-12-12 DIAGNOSIS — F439 Reaction to severe stress, unspecified: Secondary | ICD-10-CM

## 2022-12-12 DIAGNOSIS — Z8639 Personal history of other endocrine, nutritional and metabolic disease: Secondary | ICD-10-CM

## 2022-12-12 DIAGNOSIS — H903 Sensorineural hearing loss, bilateral: Secondary | ICD-10-CM

## 2022-12-13 ENCOUNTER — Encounter: Payer: Self-pay | Admitting: Family Medicine

## 2023-01-14 IMAGING — US US CAROTID DUPLEX BILAT
1 series · 13 of 24 positions shown · non-contrast
Comparison: None.

CLINICAL DATA: Right carotid bruit on physical exam

EXAM:
BILATERAL CAROTID DUPLEX ULTRASOUND
TECHNIQUE: Gray scale imaging, color Doppler and duplex ultrasound were
performed of bilateral carotid and vertebral arteries in the neck.

[Series 1: us carotid bilateral · 13 of 110 slices shown]
[im 1/110]
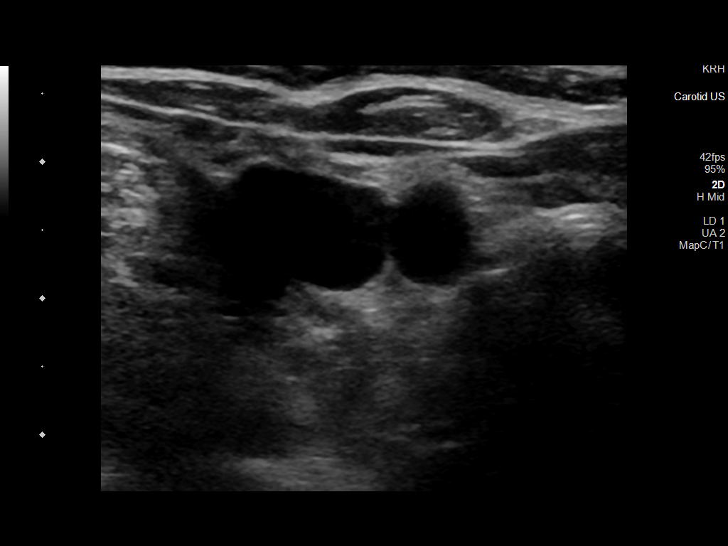
[im 10/110]
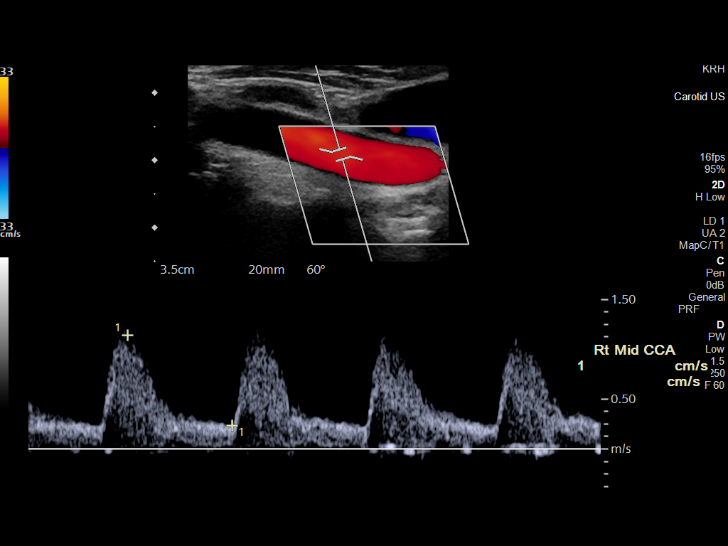
[im 19/110]
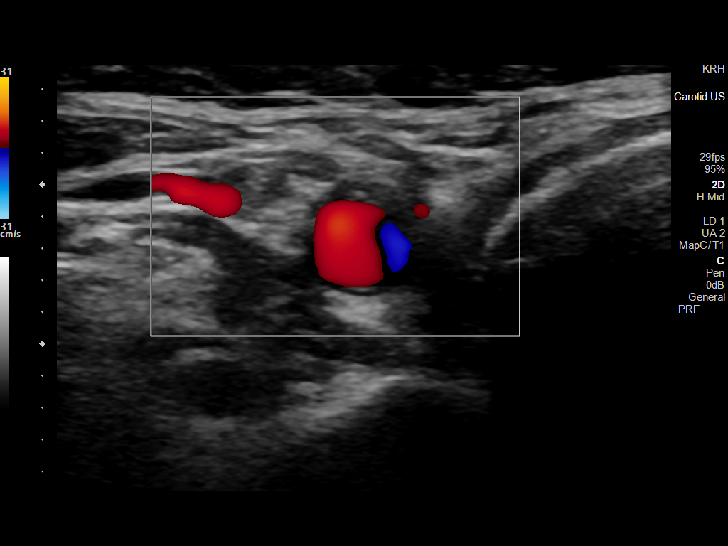
[im 29/110]
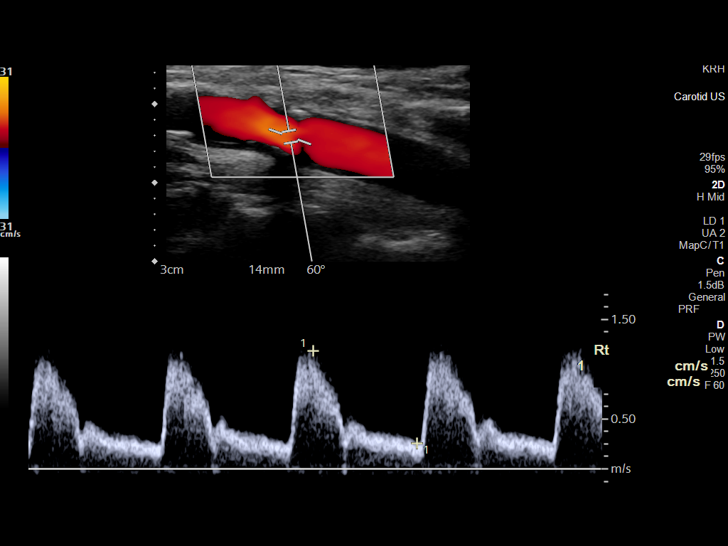
[im 38/110]
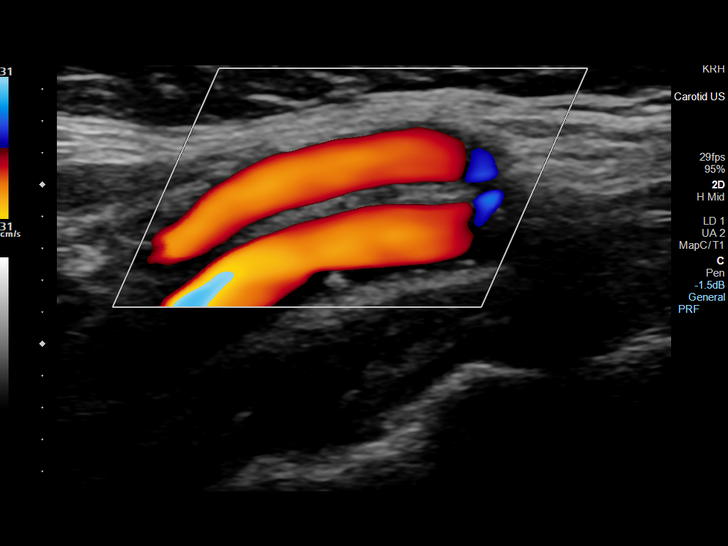
[im 48/110]
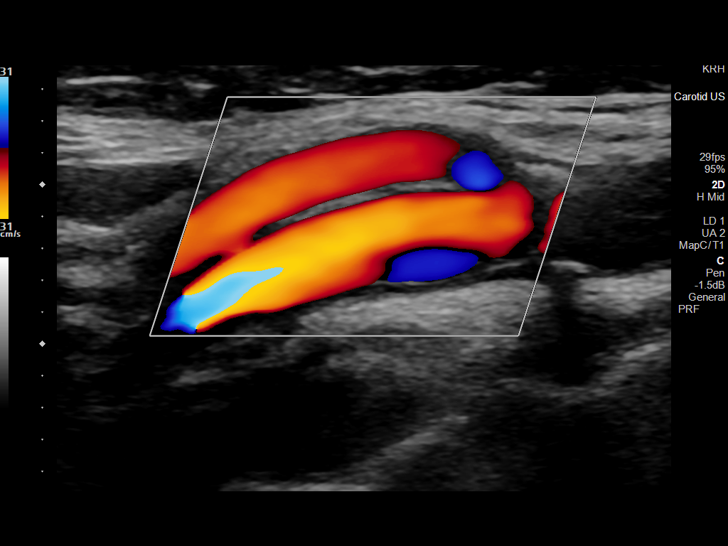
[im 57/110]
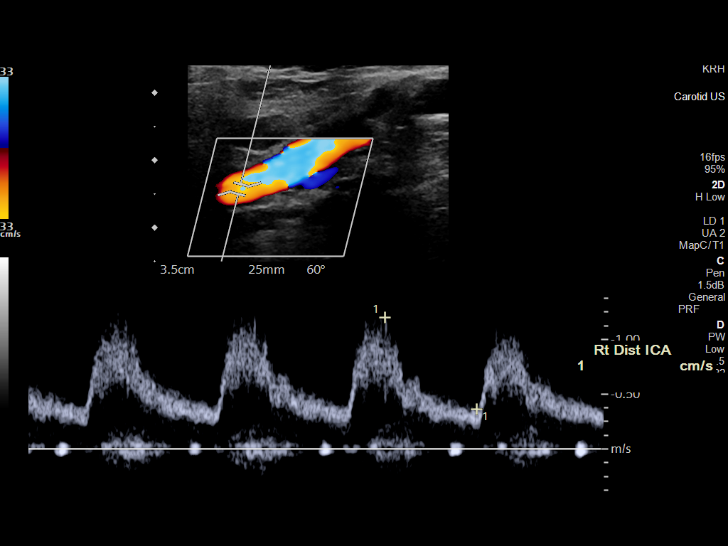
[im 62/110]
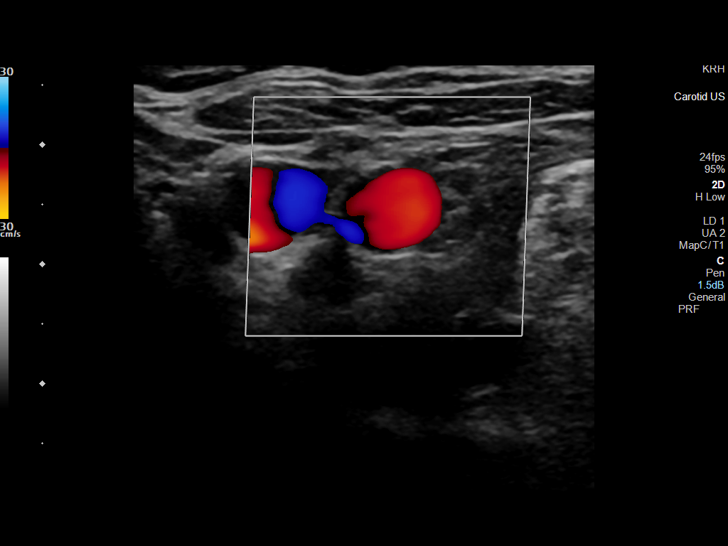
[im 72/110]
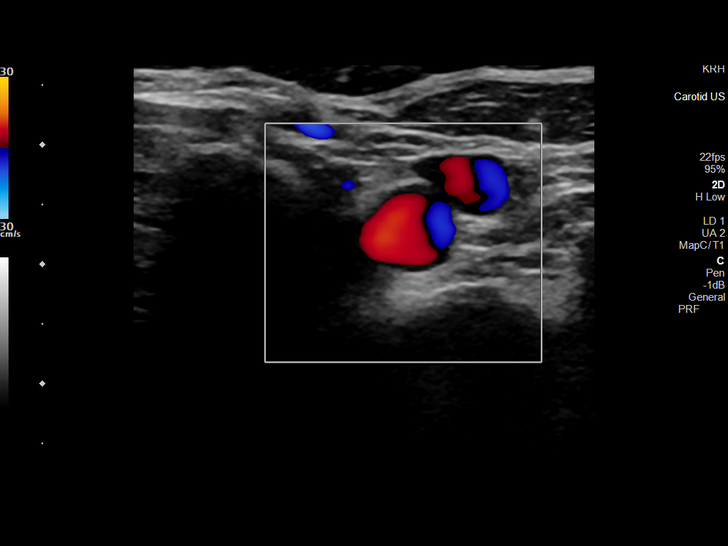
[im 81/110]
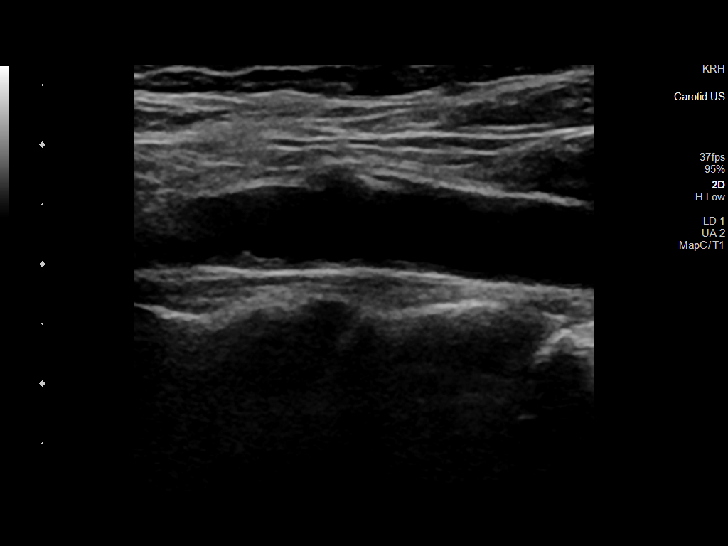
[im 91/110]
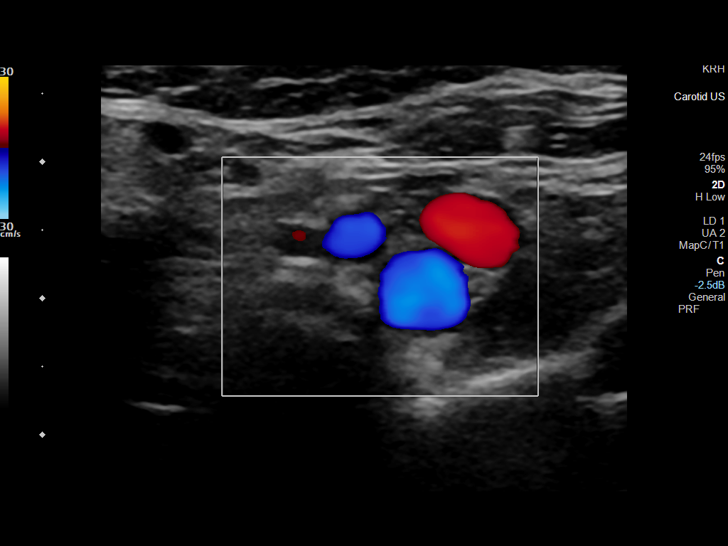
[im 100/110]
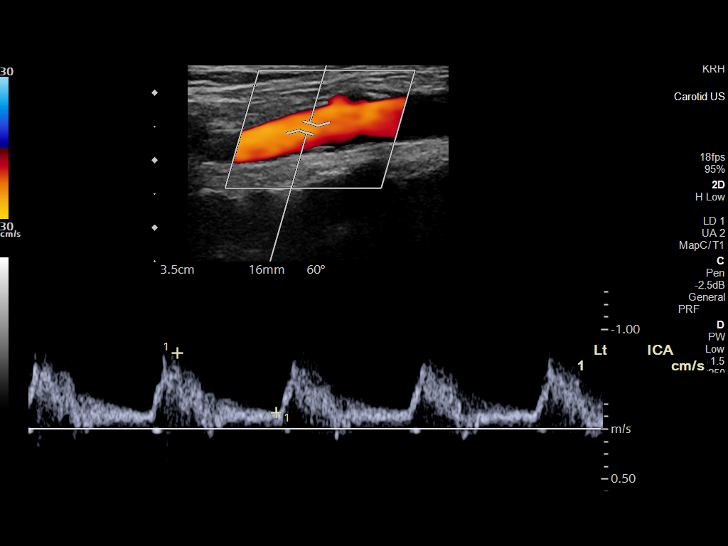
[im 110/110]
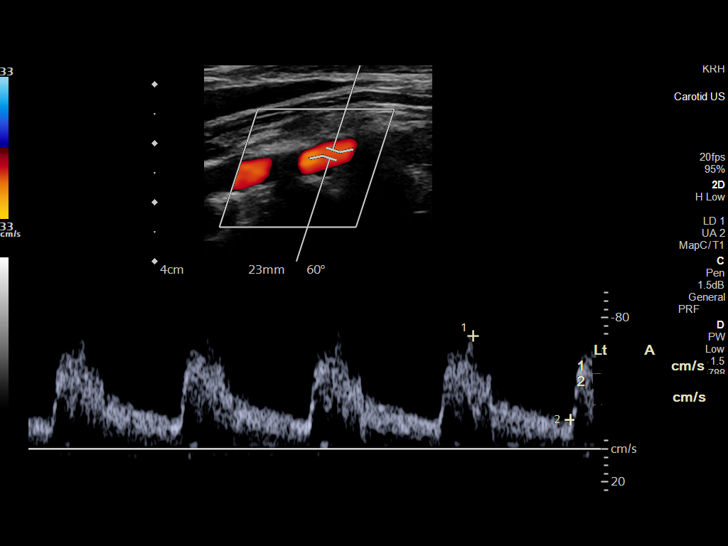

[13 of 24 positions shown; findings below may reference images not displayed]

FINDINGS: Criteria: Quantification of carotid stenosis is based on velocity
parameters that correlate the residual internal carotid diameter
with NASCET-based stenosis levels, using the diameter of the distal
internal carotid lumen as the denominator for stenosis measurement.

The following velocity measurements were obtained:

RIGHT
ICA: 120/36 cm/sec
CCA: 73/22 cm/sec

SYSTOLIC ICA/CCA RATIO:

ECA:  129 cm/sec

LEFT

ICA: 112/38 cm/sec

CCA: 107/26 cm/sec

SYSTOLIC ICA/CCA RATIO:

ECA:  114 cm/sec

RIGHT CAROTID ARTERY: Mild heterogeneous atherosclerotic plaque in
the proximal internal carotid artery. By peak systolic velocity
criteria, the estimated stenosis is less than 50%.

RIGHT VERTEBRAL ARTERY:  Patent with normal antegrade flow.

LEFT CAROTID ARTERY: Mild heterogeneous atherosclerotic plaque in
the proximal internal carotid artery. By peak systolic velocity
criteria, the estimated stenosis is less than 50%.

LEFT VERTEBRAL ARTERY:  Patent with normal antegrade flow.
IMPRESSION: 1. Mild (1-49%) stenosis proximal right internal carotid artery
secondary to heterogenous atherosclerotic plaque.
2. Mild (1-49%) stenosis proximal left internal carotid artery
secondary to heterogenous atherosclerotic plaque.
3. Vertebral arteries are patent with normal antegrade flow.

## 2023-03-06 ENCOUNTER — Ambulatory Visit (INDEPENDENT_AMBULATORY_CARE_PROVIDER_SITE_OTHER): Payer: Medicare Other | Admitting: Family Medicine

## 2023-03-06 ENCOUNTER — Encounter: Payer: Self-pay | Admitting: Family Medicine

## 2023-03-06 VITALS — BP 126/70 | HR 65 | Temp 97.5°F | Wt 102.4 lb

## 2023-03-06 DIAGNOSIS — I350 Nonrheumatic aortic (valve) stenosis: Secondary | ICD-10-CM

## 2023-03-06 DIAGNOSIS — M810 Age-related osteoporosis without current pathological fracture: Secondary | ICD-10-CM | POA: Diagnosis not present

## 2023-03-06 DIAGNOSIS — Z8673 Personal history of transient ischemic attack (TIA), and cerebral infarction without residual deficits: Secondary | ICD-10-CM | POA: Diagnosis not present

## 2023-03-06 DIAGNOSIS — E782 Mixed hyperlipidemia: Secondary | ICD-10-CM

## 2023-03-06 DIAGNOSIS — R7309 Other abnormal glucose: Secondary | ICD-10-CM

## 2023-03-06 DIAGNOSIS — F09 Unspecified mental disorder due to known physiological condition: Secondary | ICD-10-CM | POA: Diagnosis not present

## 2023-03-06 DIAGNOSIS — F430 Acute stress reaction: Secondary | ICD-10-CM

## 2023-03-06 DIAGNOSIS — I10 Essential (primary) hypertension: Secondary | ICD-10-CM

## 2023-03-06 DIAGNOSIS — G453 Amaurosis fugax: Secondary | ICD-10-CM

## 2023-03-06 DIAGNOSIS — N1832 Chronic kidney disease, stage 3b: Secondary | ICD-10-CM

## 2023-03-06 DIAGNOSIS — E559 Vitamin D deficiency, unspecified: Secondary | ICD-10-CM | POA: Diagnosis not present

## 2023-03-06 DIAGNOSIS — E063 Autoimmune thyroiditis: Secondary | ICD-10-CM | POA: Diagnosis not present

## 2023-03-06 LAB — LIPID PANEL
Cholesterol: 154 mg/dL (ref 0–200)
HDL: 46.9 mg/dL (ref >39.00)
LDL Cholesterol: 61 mg/dL (ref 0–99)
NonHDL: 106.79
Total CHOL/HDL Ratio: 3
Triglycerides: 231 mg/dL — ABNORMAL HIGH (ref 0.0–149.0)
VLDL: 46.2 mg/dL — ABNORMAL HIGH (ref 0.0–40.0)

## 2023-03-06 LAB — CBC
HCT: 38 % (ref 36.0–46.0)
Hemoglobin: 12.8 g/dL (ref 12.0–15.0)
MCHC: 33.7 g/dL (ref 30.0–36.0)
MCV: 88.2 fL (ref 78.0–100.0)
Platelets: 183 10*3/uL (ref 150.0–400.0)
RBC: 4.31 Mil/uL (ref 3.87–5.11)
RDW: 12.5 % (ref 11.5–15.5)
WBC: 8.5 10*3/uL (ref 4.0–10.5)

## 2023-03-06 LAB — BASIC METABOLIC PANEL
BUN: 20 mg/dL (ref 6–23)
CO2: 29 meq/L (ref 19–32)
Calcium: 9.5 mg/dL (ref 8.4–10.5)
Chloride: 104 meq/L (ref 96–112)
Creatinine, Ser: 0.73 mg/dL (ref 0.40–1.20)
GFR: 78.11 mL/min (ref >60.00)
Glucose, Bld: 88 mg/dL (ref 70–99)
Potassium: 4.2 meq/L (ref 3.5–5.1)
Sodium: 141 meq/L (ref 135–145)

## 2023-03-06 LAB — HEMOGLOBIN A1C: Hgb A1c MFr Bld: 5.6 % (ref 4.6–6.5)

## 2023-03-06 LAB — VITAMIN D 25 HYDROXY (VIT D DEFICIENCY, FRACTURES): VITD: 64.47 ng/mL (ref 30.00–100.00)

## 2023-03-06 LAB — TSH: TSH: 3.67 u[IU]/mL (ref 0.35–5.50)

## 2023-03-06 MED ORDER — DONEPEZIL HCL 5 MG PO TABS: 5.0000 mg | ORAL_TABLET | Freq: Every day | ORAL | 1 refills | Status: DC

## 2023-03-06 MED ORDER — ESCITALOPRAM OXALATE 5 MG PO TABS: 5.0000 mg | ORAL_TABLET | Freq: Every day | ORAL | 1 refills | Status: DC

## 2023-03-06 MED ORDER — LISINOPRIL 40 MG PO TABS: 40.0000 mg | ORAL_TABLET | Freq: Every day | ORAL | 1 refills | Status: DC

## 2023-03-06 NOTE — Patient Instructions (Addendum)
 Return in about 24 weeks (around 08/21/2023) for Routine chronic condition follow-up.        Great to see you today.  I have refilled the medication(s) we provide.   If labs were collected or images ordered, we will inform you of  results once we have received them and reviewed. We will contact you either by echart message, or telephone call.  Please give ample time to the testing facility, and our office to run,  receive and review results. Please do not call inquiring of results, even if you can see them in your chart. We will contact you as soon as we are able. If it has been over 1 week since the test was completed, and you have not yet heard from us , then please call us .    - echart message- for normal results that have been seen by the patient already.   - telephone call: abnormal results or if patient has not viewed results in their echart.  If a referral to a specialist was entered for you, please call us  in 2 weeks if you have not heard from the specialist office to schedule.

## 2023-03-06 NOTE — Progress Notes (Signed)
 Cynthia Powell , 05-11-1945, 78 y.o., female MRN: [id number] Patient Care Team    Relationship Specialty Notifications Start End  Catherine Charlies LABOR, DO PCP - General Family Medicine  03/02/15   Elma Zachary RAMAN  Optometry  09/30/18   [facility]  Audiology  09/30/18   Marvis Ronal BIRCH., MD Referring Physician Gastroenterology  10/11/21   Beryl Donnice BRAVO, MD Referring Physician Endocrinology  03/06/23   Dolphus Reiter, MD Consulting Physician Rheumatology  03/06/23   Bernie Lamar PARAS, MD Consulting Physician Cardiology  03/06/23   Gregg Lek, MD Consulting Physician Neurology  03/06/23     Chief Complaint  Patient presents with   Hypertension    Pt is not fasting; cmc     Subjective: Cynthia Powell is a 78 y.o. female present for [facility] Hypertension/hyperlipidemia-/CVA/TIA:Patient reports compliance with lisinopril  40 mg QD and amlodipine  2.5 mg daily. Patient denies chest pain, shortness of breath, dizziness or lower extremity edema.  Pt does  take daily baby ASA.  She is now taking Lipitor 10 mg daily. Diet: does not closely monitor.  Exercise: does not routinely exercise.  RF: HTN, HLD, FHX stroke (mother), TIA, CVA  Hypothyroid:Pt reports compliance with 50 mcg synthroid  daily on an empty stomach. TSH 5.2 (07/27/2022) on current dose.  Endocrinology-Dr. Beryl.   Vit d deficiency/osteoporosis: Pt continues to take 5000 u vit d once a week. She declined fosamax start  MRI brain 03/06/2022: FINDINGS: MRI HEAD FINDINGS Brain: No acute infarction, hemorrhage, hydrocephalus, extra-axial collection or mass lesion. 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 Moderate patchy and confluence T2 hyperintensity in the deep cerebral, periventricular and subcortical white matter, consistent with chronic small-vessel disease. No foci of abnormal susceptibility. Bilateral hippocampal atrophy. No abnormal intracranial enhancement. Vascular: Normal flow  voids. Skull and upper cervical spine: Normal marrow signal. Sinuses/Orbits: No acute or significant finding. Other: None. MRA HEAD FINDINGS Anterior circulation: No large vessel occlusion, aneurysm or hemodynamically significant stenosis. Posterior circulation: No large vessel occlusion, aneurysm or hemodynamically significant stenosis. Anatomic variants: Right posterior communicating artery is not visualized. MRA NECK FINDINGS Aortic arch: Three-vessel arch configuration. Arch vessel origins are patent. Right carotid system: No significant stenosis. Left carotid system: No significant stenosis. Vertebral arteries: No significant stenosis. Other: None IMPRESSION: No acute intracranial pathology, large vessel occlusion, or hemodynamically significant stenosis in the head or neck vessels.        09/12/2022    1:32 PM 05/16/2022    2:11 PM 02/02/2022    3:01 PM 10/11/2021    1:21 PM 05/12/2021    5:50 PM  Depression screen PHQ 2/9  Decreased Interest 0 0 0 0 0  Down, Depressed, Hopeless 0 0 0 0 0  PHQ - 2 Score 0 0 0 0 0  Altered sleeping 2      Tired, decreased energy 3      Change in appetite 1      Feeling bad or failure about yourself  0      Trouble concentrating 1      Moving slowly or fidgety/restless 0      Suicidal thoughts 0      PHQ-9 Score 7      Difficult doing work/chores Somewhat difficult        Allergies  Allergen Reactions   Iodinated Contrast Media     Other reaction(s): UNCONSCIOUSNESS   Spinach    Corn-Containing Products Diarrhea   Fish  Allergy Diarrhea    Pt c/o Bluefin tuna and trout causing diarrhea.   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   Hyperlipidemia    using Krill oil; refuses statin   Hypertension    Murmur, cardiac 02/02/2022   Osteoporosis    Polyarthralgia 04/05/2020   Sensory hearing loss, bilateral 2017   Bilateral hearing aids. [facility]   Snoring 09/27/2020   Spider veins of both lower extremities 09/12/2015   Thyroid  disease 1980   Trigger finger    Wears hearing aid in both ears    Past Surgical History:  Procedure Laterality Date   BREAST SURGERY  1990   biopsy   CESAREAN SECTION     x2   COLONOSCOPY  05/16/2021   COLPOSCOPY     EYE SURGERY  cataracts removed 2015   TONSILLECTOMY  1954   TUBAL LIGATION  1973   Family History  Problem Relation Age of Onset   Arthritis Mother    Diabetes Mother    Stroke Mother        dementia after   Hypertension Mother    Fibromyalgia Mother    Varicose Veins Mother    Heart disease Father 4   Hypertension Father    Colon cancer Father        mets liver   Pernicious anemia Father    Cancer Father    Breast cancer Sister        breast cancer   Hearing loss Maternal Grandfather    Melanoma Daughter        melanoma 2009   Diabetes Daughter    ADD / ADHD Daughter    Anxiety disorder Daughter    Learning disabilities Daughter    Lymphoma Daughter        Brain mets- on chemo ([facility])   OCD Daughter    Mental illness  Daughter    Anxiety disorder Daughter    Asthma Daughter    Cancer Daughter    ADD / ADHD Daughter    Cancer Daughter    Diabetes Daughter    Learning disabilities Daughter    Allergies as of 03/06/2023       Reactions   Iodinated Contrast Media    Other reaction(s): UNCONSCIOUSNESS   Spinach    Corn-containing Products Diarrhea   Fish Allergy Diarrhea   Pt  c/o Bluefin tuna and trout causing diarrhea.   Rice Diarrhea        Medication List        Accurate as of March 06, 2023  1:40 PM. If you have any questions, ask your nurse or doctor.          amLODipine  2.5 MG tablet Commonly known as: NORVASC  Take 1 tablet (2.5 mg total) by mouth daily.   aspirin  EC 81 MG tablet Take 1 tablet (81 mg total) by mouth daily. Swallow whole.   atorvastatin  10 MG tablet Commonly known as: LIPITOR Take 1 tablet (10 mg total) by mouth daily.   cholecalciferol 1000 units tablet Commonly known as: VITAMIN D  Take 5,000 Units by mouth daily.   donepezil  5 MG tablet Commonly known as: ARICEPT  Take 1 tablet (5 mg total) by mouth at bedtime. Started by: Charlies Bellini   escitalopram  5 MG tablet Commonly known as: Lexapro  Take 1 tablet (5 mg total) by mouth daily. Started by: Adelfo Diebel   levothyroxine  50 MCG tablet Commonly known as: SYNTHROID  Take 1 tablet (50 mcg total) by mouth daily before breakfast.   lisinopril  40 MG tablet Commonly known as: ZESTRIL  Take 1 tablet (40 mg total) by mouth daily.   VITAMIN B 12 PO Take 1 tablet by mouth daily.        All past medical history, surgical history, allergies, family history, immunizations andmedications were updated in the EMR today and reviewed under the history and medication portions of their EMR.     ROS: Negative, with the exception of above mentioned in HPI   Objective:  BP 126/70   Pulse 65   Temp (!) 97.5 F (36.4 C)   Wt 102 lb 6.4 oz (46.4 kg)   SpO2 97%   BMI 19.67 kg/m  Body mass index is 19.67  kg/m. Physical Exam Vitals and nursing note reviewed.  Constitutional:      General: She is not in acute distress.    Appearance: Normal appearance. She is not ill-appearing, toxic-appearing or diaphoretic.  HENT:     Head: Normocephalic and atraumatic.  Eyes:     General: No scleral icterus.       Right eye: No discharge.        Left eye: No discharge.     Extraocular Movements: Extraocular movements intact.     Conjunctiva/sclera: Conjunctivae normal.     Pupils: Pupils are equal, round, and reactive to light.  Cardiovascular:     Rate and Rhythm: Normal rate and regular rhythm.     Heart sounds: Murmur heard.  Pulmonary:     Effort: Pulmonary effort is normal. No respiratory distress.     Breath sounds: Normal breath sounds. No wheezing, rhonchi or rales.  Musculoskeletal:     Right lower leg: No edema.     Left lower leg: No edema.  Skin:    General: Skin is warm.     Findings: No rash.  Neurological:     Mental Status: She is alert and oriented to person, place, and time. Mental status is at baseline.     Motor: No weakness.     Gait: Gait normal.  Psychiatric:        Mood and Affect: Mood normal.        Behavior: Behavior normal.        Thought Content: Thought content normal.        Judgment: Judgment normal.     No results found. No results  found. No results found for this or any previous visit (from the past 24 hours).   Assessment/Plan: DEB LOUDIN is a 78 y.o. female present for OV for  Hypothyroidism, unspecified type/elevated glucose Thyroid /TSH levels have been normal since patient has been complianttaking the taking the correct dose prescribed.  She had concerns and asked for referral to endocrine which was placed for her.  They reviewed her history and repeated her TSH which again was normal on the levothyroxine  50 mcg daily. Continue levothyroxine  50 mcg daily. Endocrine-Dr. Beryl,  per request of patient manages thyroid  TSH, T4 free normal  12/04/2022. A1c collected today.  Stage 3b chronic kidney disease (HCC)/vitamin D  deficiency/osteoporosis Patient has been strongly encouraged to obtain adequate hydration. -We will attempt to avoid long-term NSAIDs if possible. -GFR stable at 53 Vitamin D  levels up-to-date 06/07/2022 CBC, BMP, PTH/ca collected today.  Hypertension/hyperlipidemia/TIA/h/o CVA/cognitive decline/fh alz dementia: Stable Continue lisinopril  40 mg QD. Continue amlodipine  2.5 mg daily-managed by cardiology. Continue Lipitor 10 mg daily Continue baby aspirin  CBC, BMP and lipids collected today.  Anxious/frustrated Stable Continue lexapro  5 mg  Reviewed expectations re: course of current medical issues. Discussed self-management of symptoms. Outlined signs and symptoms indicating need for more acute intervention. Patient verbalized understanding and all questions were answered. Patient received an After-Visit Summary.    Orders Placed This Encounter  Procedures   CBC   Basic Metabolic Panel (BMET)   Lipid panel   Hemoglobin A1c   Vitamin D  (25 hydroxy)   PTH, Intact and Calcium    TSH    Meds ordered this encounter  Medications   lisinopril  (ZESTRIL ) 40 MG tablet    Sig: Take 1 tablet (40 mg total) by mouth daily.    Dispense:  90 tablet    Refill:  1   escitalopram  (LEXAPRO ) 5 MG tablet    Sig: Take 1 tablet (5 mg total) by mouth daily.    Dispense:  90 tablet    Refill:  1   donepezil  (ARICEPT ) 5 MG tablet    Sig: Take 1 tablet (5 mg total) by mouth at bedtime.    Dispense:  90 tablet    Refill:  1    Referral Orders  No referral(s) requested today     Note is dictated utilizing voice recognition software. Although note has been proof read prior to signing, occasional typographical errors still can be missed. If any questions arise, please do not hesitate to call for verification.   electronically signed by:  Charlies Bellini, DO  [facility] [place]

## 2023-03-07 ENCOUNTER — Encounter: Payer: Self-pay | Admitting: Family Medicine

## 2023-03-07 LAB — PTH, INTACT AND CALCIUM
Calcium: 9.8 mg/dL (ref 8.6–10.4)
PTH: 27 pg/mL (ref 16–77)

## 2023-03-08 NOTE — Telephone Encounter (Signed)
 No further action needed.

## 2023-03-12 DIAGNOSIS — E063 Autoimmune thyroiditis: Secondary | ICD-10-CM | POA: Insufficient documentation

## 2023-03-12 HISTORY — DX: Autoimmune thyroiditis: E06.3

## 2023-03-13 ENCOUNTER — Encounter: Payer: Self-pay | Admitting: Cardiology

## 2023-03-13 ENCOUNTER — Ambulatory Visit: Payer: Medicare Other | Attending: Cardiology | Admitting: Cardiology

## 2023-03-13 VITALS — BP 140/64 | HR 72 | Ht 60.0 in | Wt 98.0 lb

## 2023-03-13 DIAGNOSIS — R0609 Other forms of dyspnea: Secondary | ICD-10-CM | POA: Diagnosis not present

## 2023-03-13 DIAGNOSIS — I10 Essential (primary) hypertension: Secondary | ICD-10-CM | POA: Diagnosis not present

## 2023-03-13 DIAGNOSIS — I350 Nonrheumatic aortic (valve) stenosis: Secondary | ICD-10-CM | POA: Insufficient documentation

## 2023-03-13 DIAGNOSIS — E063 Autoimmune thyroiditis: Secondary | ICD-10-CM | POA: Diagnosis not present

## 2023-03-13 DIAGNOSIS — I340 Nonrheumatic mitral (valve) insufficiency: Secondary | ICD-10-CM | POA: Diagnosis not present

## 2023-03-13 NOTE — Patient Instructions (Signed)

## 2023-03-13 NOTE — Progress Notes (Signed)
 Cardiology Office Note:    Date:  03/13/2023   ID:  NALLEY RAVEL, DOB [birth year], MRN [id number]  PCP:  Mariel Shope, DO  Cardiologist:  Ralene Burger, MD    Referring MD: Mariel Shope, DO   Chief Complaint  Patient presents with   Follow-up    History of Present Illness:    Cynthia Powell is a 78 y.o. female past medical history significant for mild aortic stenosis mild mitral regurgitation, dyslipidemia she was referred to us  because of normal echocardiogram.  She did have episode of amaurosis fugax, she did have CT of the head done which showed some small CVAs, investigation so far included monitor which did not show any atrial fibrillation.  She comes today to months for follow-up she complained of being weak tired exhausted she blame it on probably thyroid  finally is getting a lipid better but still she sits at home does not want to go outside and exercise.  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   Hyperlipidemia    using Krill oil; refuses statin   Hypertension    Murmur, cardiac 02/02/2022   Osteoporosis    Polyarthralgia  04/05/2020   Sensory hearing loss, bilateral 2017   Bilateral hearing aids. [facility]   Snoring 09/27/2020   Spider veins of both lower extremities 09/12/2015   Thyroid  disease 1980   Trigger finger    Wears hearing aid in both ears     Past Surgical History:  Procedure Laterality Date   BREAST SURGERY  1990   biopsy   CESAREAN SECTION     x2   COLONOSCOPY  05/16/2021   COLPOSCOPY     EYE SURGERY  cataracts removed 2015   TONSILLECTOMY  1954   TUBAL LIGATION  1973    Current Medications: Current Meds  Medication Sig   amLODipine  (NORVASC ) 2.5 MG tablet Take 1 tablet (2.5 mg total) by mouth daily.   aspirin  EC 81 MG tablet Take 1 tablet (81 mg total) by mouth daily. Swallow whole.   atorvastatin  (LIPITOR) 10 MG tablet Take 1 tablet (10 mg total) by mouth daily.   cholecalciferol (VITAMIN D ) 1000 units tablet Take 5,000 Units by mouth daily.   Cyanocobalamin  (VITAMIN B 12 PO) Take 1 tablet by mouth daily.   donepezil  (ARICEPT ) 5 MG tablet Take 1 tablet (5 mg total) by mouth at bedtime.   escitalopram  (LEXAPRO ) 5 MG tablet Take 1 tablet (5 mg  total) by mouth daily.   levothyroxine  (SYNTHROID ) 50 MCG tablet Take 1 tablet (50 mcg total) by mouth daily before breakfast.   lisinopril  (ZESTRIL ) 40 MG tablet Take 1 tablet (40 mg total) by mouth daily.     Allergies:   Iodinated contrast media, Spinach, Corn-containing products, Fish allergy, and Rice   Social History   Socioeconomic History   Marital status: Married    Spouse name: Not on file   Number of children: Not on file   Years of education: Not on file   Highest education level: Bachelor's degree (e.g., BA, AB, BS)  Occupational History   Not on file  Tobacco Use   Smoking status: Never    Passive exposure: Past   Smokeless tobacco: Never   Tobacco comments:    Parents smoked a lot, I tried but quit when young  Vaping Use   Vaping status: Never Used  Substance and Sexual Activity   Alcohol use: Not  Currently    Comment: rarely glass of wine a year but not for several years   Drug use: Never   Sexual activity: Yes    Birth control/protection: None    Comment: not needed  Other Topics Concern   Not on file  Social History Narrative   Not on file   Social Drivers of Health   Financial Resource Strain: Low Risk  (02/27/2023)   Overall Financial Resource Strain (CARDIA)    Difficulty of Paying Living Expenses: Not very hard  Food Insecurity: No Food Insecurity (02/27/2023)   Hunger Vital Sign    Worried About Running Out of Food in the Last Year: Never true    Ran Out of Food in the Last Year: Never true  Transportation Needs: No Transportation Needs (02/27/2023)   PRAPARE - Administrator, Civil Service (Medical): No    Lack of Transportation (Non-Medical): No  Physical Activity: Inactive (02/27/2023)   Exercise Vital Sign    Days of Exercise per Week: 0 days    Minutes of Exercise per Session: 0 min  Stress: No Stress Concern Present (02/27/2023)   [employer] of Occupational Health - Occupational Stress Questionnaire    Feeling of Stress : Only a little  Social Connections: Socially Integrated (02/27/2023)   Social Connection and Isolation Panel [NHANES]    Frequency of Communication with Friends and Family: Three times a week    Frequency of Social Gatherings with Friends and Family: More than three times a week    Attends Religious Services: 1 to 4 times per year    Active Member of [employer] or Organizations: Yes    Attends Banker Meetings: 1 to 4 times per year    Marital Status: Married     Family History: The patient's family history includes ADD / ADHD in her daughter and daughter; Anxiety disorder in her daughter and daughter; Arthritis in her mother; Asthma in her daughter; Breast cancer in her sister; Cancer in her daughter, daughter, and father; Colon cancer in her father; Diabetes in her daughter, daughter, and mother; Fibromyalgia in her  mother; Hearing loss in her maternal grandfather; Heart disease (age of onset: 50) in her father; Hypertension in her father and mother; Learning disabilities in her daughter and daughter; Lymphoma in her daughter; Melanoma in her daughter; Mental illness in her daughter; OCD in her daughter; Pernicious anemia in her father; Stroke in her mother; Varicose Veins in her mother. ROS:   Please see the history of present  illness.    All 14 point review of systems negative except as described per history of present illness  EKGs/Labs/Other Studies Reviewed:         Recent Labs: 03/06/2023: BUN 20; Creatinine, Ser 0.73; Hemoglobin 12.8; Platelets 183.0; Potassium 4.2; Sodium 141; TSH 3.67  Recent Lipid Panel    Component Value Date/Time   CHOL 154 03/06/2023 1307   TRIG 231.0 (H) 03/06/2023 1307   HDL 46.90 03/06/2023 1307   CHOLHDL 3 03/06/2023 1307   VLDL 46.2 (H) 03/06/2023 1307   LDLCALC 61 03/06/2023 1307   LDLCALC 90 01/31/2021 1459   LDLDIRECT 72 02/02/2022 1535    Physical Exam:    VS:  BP (!) 140/64 (BP Location: Right Arm, Patient Position: Sitting)   Pulse 72   Ht 5' (1.524 m)   Wt 98 lb (44.5 kg)   PF 97 L/min   BMI 19.14 kg/m     Wt Readings from Last 3 Encounters:  03/13/23 98 lb (44.5 kg)  03/06/23 102 lb 6.4 oz (46.4 kg)  12/11/22 102 lb 9.6 oz (46.5 kg)     GEN:  Well nourished, well developed in no acute distress HEENT: Normal NECK: No JVD; No carotid bruits LYMPHATICS: No lymphadenopathy CARDIAC: RRR, no murmurs, no rubs, no gallops RESPIRATORY:  Clear to auscultation without rales, wheezing or rhonchi  ABDOMEN: Soft, non-tender, non-distended MUSCULOSKELETAL:  No edema; No deformity  SKIN: Warm and dry LOWER EXTREMITIES: no swelling NEUROLOGIC:  Alert and oriented x 3 PSYCHIATRIC:  Normal affect   ASSESSMENT:    1. Nonrheumatic aortic valve stenosis   2. Essential hypertension   3. Nonrheumatic mitral valve regurgitation   4. Hypothyroidism due to  Hashimoto thyroiditis    PLAN:    In order of problems listed above:  Nonrheumatic aortic valve stenosis.  Will repeat echocardiogram to recheck the lesion. Essential hypertension slightly elevated my office but at home in a different office normal we will continue monitoring. Nonrheumatic mitral valve regurgitation echocardiogram will be done, hypothyroidism follow-up by antimedicine team getting better. Dyslipidemia I did review K PN which show me LDL 61 HDL 46 good cholesterol control continue present management   Medication Adjustments/Labs and Tests Ordered: Current medicines are reviewed at length with the patient today.  Concerns regarding medicines are outlined above.  No orders of the defined types were placed in this encounter.  Medication changes: No orders of the defined types were placed in this encounter.   Signed, Manfred Seed, MD, [facility] 03/13/2023 10:04 AM    [facility]

## 2023-03-13 NOTE — Addendum Note (Signed)
 Addended by: Shawnee Dellen D on: 03/13/2023 10:22 AM   Modules accepted: Orders

## 2023-04-12 DIAGNOSIS — H401131 Primary open-angle glaucoma, bilateral, mild stage: Secondary | ICD-10-CM | POA: Diagnosis not present

## 2023-04-23 ENCOUNTER — Ambulatory Visit (HOSPITAL_BASED_OUTPATIENT_CLINIC_OR_DEPARTMENT_OTHER)
Admission: RE | Admit: 2023-04-23 | Discharge: 2023-04-23 | Disposition: A | Discharge status: Home / Self Care | Payer: Medicare Other | Source: Ambulatory Visit | Attending: Cardiology | Admitting: Cardiology

## 2023-04-23 DIAGNOSIS — R0609 Other forms of dyspnea: Secondary | ICD-10-CM | POA: Diagnosis not present

## 2023-04-23 LAB — ECHOCARDIOGRAM COMPLETE
AR max vel: 1.74 cm2
AV Area VTI: 1.87 cm2
AV Area mean vel: 1.82 cm2
AV Mean grad: 5.7 mm[Hg]
AV Peak grad: 10.9 mm[Hg]
AV Vena cont: 0.2 cm
Ao pk vel: 1.65 m/s
Area-P 1/2: 3.46 cm2
Calc EF: 72.4 %
MV M vel: 3.31 m/s
MV Peak grad: 43.8 mm[Hg]
P 1/2 time: 1284 ms
S' Lateral: 1.9 cm
Single Plane A2C EF: 69.9 %
Single Plane A4C EF: 73.9 %

## 2023-05-03 ENCOUNTER — Telehealth: Payer: Self-pay

## 2023-05-03 NOTE — Telephone Encounter (Signed)
 Noted.

## 2023-05-03 NOTE — Telephone Encounter (Signed)
 Reason for ZOX:WRUEAVW is calling to inform provider that she is leaving her current endocrinologist's office and would like provider to take over managing her thyroid issues. She reports that Dr. Shawnee Knapp and his office were great, but it was just not the right fit. She has requested her endocrinology records be sent to the provider and a request has been placed in chart to be faxed over to Dr. Lacie Draft office.   FYI.

## 2023-05-15 ENCOUNTER — Telehealth: Payer: Self-pay

## 2023-05-15 NOTE — Telephone Encounter (Signed)
 Left message on My Chart with normal Echo results per Dr. Vanetta Shawl note. Routed to PCP

## 2023-05-16 ENCOUNTER — Telehealth: Payer: Self-pay

## 2023-05-16 NOTE — Telephone Encounter (Signed)
 Pt viewed lab results on My Chart per Dr. Vanetta Shawl note. Routed to PCP.

## 2023-05-30 ENCOUNTER — Ambulatory Visit: Admitting: Family Medicine

## 2023-05-30 ENCOUNTER — Encounter: Payer: Self-pay | Admitting: Family Medicine

## 2023-05-30 VITALS — BP 130/70 | HR 79 | Temp 97.9°F | Wt 100.2 lb

## 2023-05-30 DIAGNOSIS — R5383 Other fatigue: Secondary | ICD-10-CM | POA: Insufficient documentation

## 2023-05-30 DIAGNOSIS — R400 Somnolence: Secondary | ICD-10-CM | POA: Diagnosis not present

## 2023-05-30 DIAGNOSIS — R9089 Other abnormal findings on diagnostic imaging of central nervous system: Secondary | ICD-10-CM | POA: Diagnosis not present

## 2023-05-30 DIAGNOSIS — R4189 Other symptoms and signs involving cognitive functions and awareness: Secondary | ICD-10-CM

## 2023-05-30 DIAGNOSIS — R790 Abnormal level of blood mineral: Secondary | ICD-10-CM | POA: Insufficient documentation

## 2023-05-30 LAB — IBC + FERRITIN
Ferritin: 73 ng/mL (ref 10.0–291.0)
Iron: 101 ug/dL (ref 42–145)
Saturation Ratios: 28.9 % (ref 20.0–50.0)
TIBC: 350 ug/dL (ref 250.0–450.0)
Transferrin: 250 mg/dL (ref 212.0–360.0)

## 2023-05-30 LAB — CK: Total CK: 33 U/L (ref 7–177)

## 2023-05-30 LAB — CORTISOL: Cortisol, Plasma: 8.5 ug/dL

## 2023-05-30 MED ORDER — LIOTHYRONINE SODIUM 5 MCG PO TABS: 5.0000 ug | ORAL_TABLET | Freq: Every day | ORAL | 1 refills | Status: DC

## 2023-05-30 NOTE — Progress Notes (Unsigned)
 Cynthia Powell , 1945-05-01, 78 y.o., female MRN: [id number] Patient Care Team    Relationship Specialty Notifications Start End  Natalia Leatherwood, DO PCP - General Family Medicine  03/02/15   Marlene Bast  Optometry  09/30/18   [facility]  Audiology  09/30/18   Sydnee Cabal., MD Referring Physician Gastroenterology  10/11/21   Betsey Amen, MD Referring Physician Endocrinology  03/06/23   Pollyann Savoy, MD Consulting Physician Rheumatology  03/06/23   Georgeanna Lea, MD Consulting Physician Cardiology  03/06/23   Windell Norfolk, MD Consulting Physician Neurology  03/06/23     Chief Complaint  Patient presents with   Hypertension   Hyperlipidemia     Subjective: Cynthia Powell is a 78 y.o. female present for concerns over her cognitive decline and fatigue, that seems to have been worsening over the last few months. Patient reports she has "extreme fatigue."  She reports she falls asleep when watching TV, she has to nap 1-2 times in the afternoon.  She states sometimes the naps will be a total of 3 hours of her afternoon.  Patient reports she goes to sleep about 11:00 at night and will sleep 5-6 hours.  Patient does not think she snores.  She reports she has absolutely no energy to do anything in the afternoon.  She feels she is losing weight, but eats well.  She does not count calories.  She states she eats at least 3 meals a day and sometimes snacks between.  Recent labs reviewed: Vitamin D-within normal limits B12-within normal limits TSH-within normal limits, patient is compliant with medication. CBC, CMP within normal limits. Echocardiogram completed 03/2023 mild grade 1 diastolic dysfunction.  Mild aortic sclerosis without stenosis present.  Mild aortic regurgitation present She has followed with rheumatology in the past for a mildly positive double-stranded DNA in the past, rheumatology not feel elevation was pertinent.  MRI brain  03/06/2022: FINDINGS: MRI HEAD FINDINGS Brain: No acute infarction, hemorrhage, hydrocephalus, extra-axial collection or mass lesion. 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 Moderate patchy and confluence T2 hyperintensity in the deep cerebral, periventricular and subcortical white matter, consistent with chronic small-vessel disease. No foci of abnormal susceptibility. Bilateral hippocampal atrophy. No abnormal intracranial enhancement. Vascular: Normal flow voids. Skull and upper cervical spine: Normal marrow signal. Sinuses/Orbits: No acute or significant finding. Other: None. MRA HEAD FINDINGS Anterior circulation: No large vessel occlusion, aneurysm or hemodynamically significant stenosis. Posterior circulation: No large vessel occlusion, aneurysm or hemodynamically significant stenosis. Anatomic variants: Right posterior communicating artery is not visualized. MRA NECK FINDINGS Aortic arch: Three-vessel arch configuration. Arch vessel origins are patent. Right carotid system: No significant stenosis. Left carotid system: No significant stenosis. Vertebral arteries: No significant stenosis. Other: None IMPRESSION: No acute intracranial pathology, large vessel occlusion, or hemodynamically significant stenosis in the head or neck vessels.        05/30/2023    9:16 AM 09/12/2022    1:32 PM 05/16/2022    2:11 PM 02/02/2022    3:01 PM 10/11/2021    1:21 PM  Depression screen PHQ 2/9  Decreased Interest 0 0 0 0 0  Down, Depressed, Hopeless 0 0 0 0 0  PHQ - 2 Score 0 0 0 0 0  Altered sleeping 0 2     Tired, decreased energy 3 3     Change in appetite 0 1     Feeling bad or failure  about yourself  0 0     Trouble concentrating 1 1     Moving slowly or fidgety/restless 1 0     Suicidal thoughts 0 0     PHQ-9 Score 5 7     Difficult doing work/chores Very difficult Somewhat difficult       Allergies  Allergen Reactions   Iodinated Contrast Media      Other reaction(s): UNCONSCIOUSNESS   Spinach    Corn-Containing Products Diarrhea   Fish Allergy Diarrhea    Pt c/o Bluefin tuna and trout causing diarrhea.   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   Hyperlipidemia    using Krill oil; refuses statin   Hypertension    Murmur, cardiac 02/02/2022   Osteoporosis    Polyarthralgia 04/05/2020   Sensory hearing loss, bilateral 2017   Bilateral hearing aids. [facility]   Snoring 09/27/2020   Spider veins of both lower extremities 09/12/2015   Thyroid disease 1980   Trigger finger    Wears hearing aid in both ears    Past Surgical History:  Procedure Laterality Date   BREAST SURGERY  1990   biopsy   CESAREAN SECTION     x2   COLONOSCOPY  05/16/2021   COLPOSCOPY     EYE SURGERY  cataracts removed 2015   TONSILLECTOMY  1954   TUBAL LIGATION  1973   Family History  Problem Relation Age of Onset   Arthritis Mother    Diabetes Mother    Stroke Mother        dementia after   Hypertension Mother     Fibromyalgia Mother    Varicose Veins Mother    Heart disease Father 41   Hypertension Father    Colon cancer Father        mets liver   Pernicious anemia Father    Cancer Father    Breast cancer Sister        breast cancer   Hearing loss Maternal Grandfather    Melanoma Daughter        melanoma 2009   Diabetes Daughter    ADD / ADHD Daughter    Anxiety disorder Daughter    Learning disabilities Daughter    Lymphoma Daughter        Brain mets- on chemo ([facility])   OCD Daughter  Mental illness Daughter    Anxiety disorder Daughter    Asthma Daughter    Cancer Daughter    ADD / ADHD Daughter    Cancer Daughter    Diabetes Daughter    Learning disabilities Daughter    Allergies as of 05/30/2023       Reactions   Iodinated Contrast Media    Other reaction(s): UNCONSCIOUSNESS   Spinach    Corn-containing Products Diarrhea   Fish Allergy Diarrhea   Pt c/o Bluefin tuna and trout causing diarrhea.   Rice Diarrhea        Medication List        Accurate as of May 30, 2023 11:59 PM. If you have any questions, ask your nurse or doctor.          amLODipine 2.5 MG tablet Commonly known as: NORVASC Take 1 tablet (2.5 mg total) by mouth daily.   aspirin EC 81 MG tablet Take 1 tablet (81 mg total) by mouth daily. Swallow whole.   atorvastatin 10 MG tablet Commonly known as: LIPITOR Take 1 tablet (10 mg total) by mouth daily.   cholecalciferol 1000 units tablet Commonly known as: VITAMIN D Take 5,000 Units by mouth daily.   donepezil 5 MG tablet Commonly known as: ARICEPT Take 1 tablet (5 mg total) by mouth at bedtime.   escitalopram 5 MG tablet Commonly known as: Lexapro Take 1 tablet (5 mg total) by mouth daily.   levothyroxine 50 MCG tablet Commonly known as: SYNTHROID Take 1 tablet (50 mcg total) by mouth daily before breakfast.   liothyronine 5 MCG tablet Commonly known as: Cytomel Take 1 tablet (5 mcg total) by mouth daily. Started by: Felix Pacini   lisinopril 40 MG tablet Commonly known as: ZESTRIL Take 1 tablet (40 mg total) by mouth daily.   VITAMIN B 12 PO Take 1 tablet by mouth daily.        All past medical history, surgical history, allergies, family history, immunizations andmedications were updated in the EMR today and reviewed under the history and medication portions of their EMR.     ROS: Negative, with the exception of above mentioned in HPI   Objective:  BP 130/70   Pulse 79   Temp 97.9 F (36.6 C)   Wt 100 lb 3.2 oz (45.5 kg)   SpO2 97%   BMI 19.57 kg/m  Body mass index is 19.57 kg/m. Physical Exam Vitals and nursing note reviewed.  Constitutional:      General: She is not in acute distress.    Appearance: Normal appearance. She is not ill-appearing, toxic-appearing or diaphoretic.  HENT:     Head: Normocephalic and atraumatic.  Eyes:     General: No scleral icterus.       Right eye: No discharge.        Left eye: No discharge.     Extraocular Movements: Extraocular movements intact.     Conjunctiva/sclera: Conjunctivae normal.     Pupils: Pupils are equal, round, and reactive to light.  Cardiovascular:     Rate and Rhythm: Normal rate and regular rhythm.     Heart sounds: Murmur heard.  Pulmonary:     Effort: Pulmonary effort is normal. No respiratory distress.     Breath sounds: Normal breath sounds. No wheezing, rhonchi or rales.  Musculoskeletal:     Right lower leg: No edema.     Left lower leg: No edema.  Skin:    General: Skin is warm.  Findings: No rash.  Neurological:     Mental Status: She is alert and oriented to person, place, and time. Mental status is at baseline.     Motor: No weakness.     Gait: Gait normal.  Psychiatric:        Mood and Affect: Mood normal.        Behavior: Behavior normal.        Thought Content: Thought content normal.        Judgment: Judgment normal.     No results found. No results found. No results found for this or any  previous visit (from the past 24 hours).   Assessment/Plan: Cynthia Powell is a 78 y.o. female present for OV for  fatigue (Primary)/daytime somnolence We discussed fatigue could be multifactorial.  It does not appear she gets adequate rest at night, requiring naps throughout the day.  Possible sleep disorder versus sleep apnea.  Will refer to pulmonology-[place] [facility] today for further eval. Patient is showing progression in her cognitive decline, possibly Alzheimer's with abnormal MRI findings- She has had recent lab work with TSH, vitamin D and B12.  She has had recent echocardiogram and cardiac eval, which was not concerned and did not feel there was a cardiac cause for her fatigue. - Cortisol - IBC + Ferritin - CK - Ambulatory referral to Pulmonology  Abnormal blood level of iron - IBC + Ferritin  Cognitive decline/abnormal finding on brain MRI Patient is established with neurology, she has not seen her neurologist in over a year.  Referred back to Dr. Teresa Coombs to further evaluate patient's progressing cognitive decline, possibly early Alzheimer's with MRI changes. - Ambulatory referral to Neurology to further evaluate cognitive decline and abnormalities on MRI/Alzheimer's onset - Ambulatory referral to Pulmonology-rule out sleep apnea/sleep disorder    Reviewed expectations re: course of current medical issues. Discussed self-management of symptoms. Outlined signs and symptoms indicating need for more acute intervention. Patient verbalized understanding and all questions were answered. Patient received an After-Visit Summary.    Orders Placed This Encounter  Procedures   Cortisol   IBC + Ferritin   CK   Ambulatory referral to Neurology   Ambulatory referral to Pulmonology    Meds ordered this encounter  Medications   liothyronine (CYTOMEL) 5 MCG tablet    Sig: Take 1 tablet (5 mcg total) by mouth daily.    Dispense:  90 tablet    Refill:  1    Referral Orders          Ambulatory referral to Neurology         Ambulatory referral to Pulmonology       Note is dictated utilizing voice recognition software. Although note has been proof read prior to signing, occasional typographical errors still can be missed. If any questions arise, please do not hesitate to call for verification.   electronically signed by:  Felix Pacini, DO  [facility] [place]

## 2023-05-30 NOTE — Patient Instructions (Signed)
 Start cytomel daily with your other thyroid medication Sleep referral placed for you , they will call to schedule  Follow up 3 months.    No follow-ups on file.        Great to see you today.  I have refilled the medication(s) we provide.   If labs were collected or images ordered, we will inform you of  results once we have received them and reviewed. We will contact you either by echart message, or telephone call.  Please give ample time to the testing facility, and our office to run,  receive and review results. Please do not call inquiring of results, even if you can see them in your chart. We will contact you as soon as we are able. If it has been over 1 week since the test was completed, and you have not yet heard from [place], then please call us.    - echart message- for normal results that have been seen by the patient already.   - telephone call: abnormal results or if patient has not viewed results in their echart.  If a referral to a specialist was entered for you, please call us in 2 weeks if you have not heard from the specialist office to schedule.

## 2023-05-31 ENCOUNTER — Telehealth: Payer: Self-pay | Admitting: Family Medicine

## 2023-05-31 DIAGNOSIS — R9089 Other abnormal findings on diagnostic imaging of central nervous system: Secondary | ICD-10-CM | POA: Insufficient documentation

## 2023-05-31 NOTE — Telephone Encounter (Signed)
 Please call patient Her lab work is normal. Iron, CK and cortisol levels are all normal.  I have placed referral to the [facility] to rule out sleep apnea or sleep disorder.  I have also referred her back to her neurologist for further evaluation of her progressive cognitive decline.

## 2023-05-31 NOTE — Telephone Encounter (Signed)
 Pt given results/recommendations.

## 2023-07-08 ENCOUNTER — Encounter (HOSPITAL_BASED_OUTPATIENT_CLINIC_OR_DEPARTMENT_OTHER): Payer: Self-pay | Admitting: Adult Health

## 2023-07-08 ENCOUNTER — Ambulatory Visit (HOSPITAL_BASED_OUTPATIENT_CLINIC_OR_DEPARTMENT_OTHER): Admitting: Adult Health

## 2023-07-08 VITALS — BP 98/56 | HR 73 | Ht 60.0 in | Wt 100.3 lb

## 2023-07-08 DIAGNOSIS — G4700 Insomnia, unspecified: Secondary | ICD-10-CM

## 2023-07-08 DIAGNOSIS — I679 Cerebrovascular disease, unspecified: Secondary | ICD-10-CM

## 2023-07-08 DIAGNOSIS — I10 Essential (primary) hypertension: Secondary | ICD-10-CM

## 2023-07-08 DIAGNOSIS — R5383 Other fatigue: Secondary | ICD-10-CM

## 2023-07-08 DIAGNOSIS — Z8673 Personal history of transient ischemic attack (TIA), and cerebral infarction without residual deficits: Secondary | ICD-10-CM | POA: Diagnosis not present

## 2023-07-08 DIAGNOSIS — R0683 Snoring: Secondary | ICD-10-CM | POA: Diagnosis not present

## 2023-07-08 DIAGNOSIS — G4719 Other hypersomnia: Secondary | ICD-10-CM

## 2023-07-08 NOTE — Progress Notes (Signed)
 @Patient  ID: Cynthia Powell, female    DOB: [birth year], 78 y.o.   MRN: [id number]  Chief Complaint  Patient presents with   Establish Care   Discussed the use of AI scribe software for clinical note transcription with the patient, who gave verbal consent to proceed.  Referring provider: Mariel Shope, DO  HPI: 78 year old female seen for sleep consult Jul 08, 2023 for restless sleep and daytime sleepiness.  TEST/EVENTS :   07/08/2023 Sleep consult  Patient presents for sleep consult today.  Kindly referred by primary care provider Cynthia Powell.  Patient complains of restless sleep, daytime sleepiness and fatigue.   She experiences significant daytime sleepiness, low energy, and difficulty initiating sleep. She attempts to go to bed at 10 PM but often does not fall asleep until between midnight and 2 AM. She wakes up at 6 AM to care for her granddaughter, resulting in insufficient sleep. No use of sleep aids, daytime napping, or caffeine consumption. She has not had a sleep study before.  No history of congestive heart failure.   Patient does snore and have significant daytime sleepiness.  Feels fatigue consult.  Has some memory changes.  Epworth score is 3 out of 24.  Gets sleepy if she is gets up in the morning and in the evening hours.  She notes a significant weight loss over the past two to three years, with her current weight being around 97 to 98 pounds, which is the lowest she can recall as an adult. Her usual weight was between 110 to 112 pounds.  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 She has been on medications including Aricept  for memory, Lexapro  for mood,.   She reports a family history of type 1 diabetes, granddaughter, and oldest daughter affected.  Her daughter has been battling non-Hodgkin's lymphoma.  Lives close to her.  Socially, she is retired  from [employer], lives with her husband, and has been a primary caregiver for her granddaughter, who is almost 22 years old as her daughters had non-Hodgkin's lymphoma.  She does not smoke or drink alcohol.       Allergies  Allergen Reactions   Iodinated Contrast Media     Other reaction(s): UNCONSCIOUSNESS   Spinach    Corn-Containing Products Diarrhea   Fish Allergy Diarrhea    Pt c/o Bluefin tuna and trout causing diarrhea.   Rice Diarrhea    Immunization History  Administered Date(s) Administered   Fluad Quad(high Dose 65+) 12/01/2018, 12/22/2021   Influenza Split 12/26/2007, 10/21/2008, 11/15/2011   Influenza Whole 01/01/2002   Influenza, Quadrivalent, Recombinant, Inj, Pf 11/21/2017   Influenza-Unspecified 01/31/2004, 02/10/2005, 11/20/2013, 10/20/2014, 11/22/2016, 11/29/2019, 11/13/2020, 11/12/2022   MMR 09/23/2012, 10/22/2012   PFIZER Comirnaty(Gray Top)Covid-19 Tri-Sucrose Vaccine 12/29/2021   PFIZER(Purple Top)SARS-COV-2 Vaccination 03/21/2019, 04/18/2019, 12/25/2019, 07/04/2020, 11/09/2020   Pneumococcal Conjugate-13 12/31/2011   Pneumococcal Polysaccharide-23 11/05/2006, 01/28/2017   Respiratory Syncytial Virus Vaccine,Recomb Aduvanted(Arexvy) 10/24/2021   Td 01/01/2002   Td (Adult), 2 Lf Tetanus Toxid, Preservative Free 01/01/2002   Tdap 10/02/2018   Zoster Recombinant(Shingrix ) 10/02/2018, 12/10/2018   Zoster, Live 06/25/2013    Past Medical History:  Diagnosis Date   Abnormal Pap smear of vagina [phone]   pt had h/o abnl pap; does not desire future screening/PAP   ADHD (attention deficit hyperactivity disorder), 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   Allergy  seasonal and food   Anemia recently (2023)   Arthritis    osteoarthritis   Benign neoplasm of colon    Cancer (HCC) 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   Hyperlipidemia    using Krill oil; refuses statin   Hypertension    Murmur, cardiac 02/02/2022   Osteoporosis    Polyarthralgia 04/05/2020   Sensory hearing loss, bilateral 2017   Bilateral hearing aids. [facility]   Snoring 09/27/2020   Spider veins of both lower extremities 09/12/2015   Thyroid  disease 1980   Trigger finger    Wears hearing aid in both ears     Tobacco History: Social History   Tobacco Use  Smoking Status Never   Passive exposure: Past  Smokeless Tobacco Never  Tobacco Comments   Parents smoked a lot, I tried but quit when young   Counseling given: Not Answered Tobacco comments: Parents smoked a lot, I tried but quit when young   Outpatient Medications Prior to Visit  Medication Sig Dispense Refill   aspirin  EC 81 MG tablet Take 1 tablet (81 mg total) by mouth daily. Swallow whole. 30 tablet 12   cholecalciferol (VITAMIN D ) 1000 units tablet Take 5,000 Units by mouth daily.     Cyanocobalamin  (VITAMIN B 12 PO) Take 1 tablet by mouth daily.     donepezil  (ARICEPT ) 5 MG tablet Take 1 tablet (5 mg total) by mouth at bedtime. 90 tablet 1   liothyronine  (CYTOMEL ) 5 MCG tablet Take 1 tablet (5 mcg total) by mouth daily. 90 tablet 1   amLODipine  (NORVASC ) 2.5 MG tablet Take 1 tablet (2.5 mg total) by mouth daily. (Patient not taking: Reported on 07/08/2023) 90 tablet 3   atorvastatin  (LIPITOR) 10 MG tablet Take 1 tablet (10 mg total) by mouth daily. (Patient not taking: Reported on 07/08/2023) 90 tablet 3   escitalopram  (LEXAPRO ) 5 MG tablet Take 1 tablet (5 mg total) by mouth daily. (Patient not taking:  Reported on 07/08/2023) 90 tablet 1   levothyroxine  (SYNTHROID ) 50 MCG tablet Take 1 tablet (50 mcg total) by mouth daily before breakfast. (Patient not taking: Reported on 07/08/2023) 90 tablet 3   lisinopril  (ZESTRIL ) 40 MG tablet Take 1 tablet (40 mg total) by mouth daily. (Patient not taking: Reported on 07/08/2023) 90 tablet 1   No facility-administered medications prior to visit.     Review of Systems:   Constitutional:   No  weight loss, night sweats,  Fevers, chills,+fatigue, or  lassitude.  HEENT:   No headaches,  Difficulty swallowing,  Tooth/dental problems, or  Sore throat,                No sneezing, itching, ear ache, nasal congestion, post nasal drip,   CV:  No chest pain,  Orthopnea, PND, swelling in lower extremities, anasarca, dizziness, palpitations, syncope.   GI  No heartburn, indigestion, abdominal pain, nausea, vomiting, diarrhea, change in bowel habits, loss of appetite, bloody stools.   Resp: No shortness of breath with exertion or at rest.  No excess mucus, no productive cough,  No non-productive cough,  No coughing up of blood.  No change in color of mucus.  No wheezing.  No chest wall deformity  Skin: no rash or lesions.  GU: no dysuria, change in color of urine, no urgency or frequency.  No flank pain, no hematuria   MS:  No joint pain or swelling.  No decreased range of motion.  No back pain.    Physical Exam  BP (!) 98/56   Pulse 73   Ht 5' (1.524 m)   Wt 100 lb 4.8 oz (45.5 kg)   SpO2 99%   BMI 19.59 kg/m   GEN: A/Ox3; pleasant , NAD, well nourished    HEENT:  [place]/AT,  NOSE-clear, THROAT-clear, no lesions, no postnasal drip or exudate noted.  Class II MP airway  NECK:  Supple w/ fair ROM; no JVD; normal carotid impulses w/o bruits; no thyromegaly or nodules palpated; no lymphadenopathy.    RESP  Clear  P & A; w/o, wheezes/ rales/ or rhonchi. no accessory muscle use, no dullness to percussion  CARD:  RRR, no m/r/g, no peripheral edema, pulses  intact, no cyanosis or clubbing.  GI:   Soft & nt; nml bowel sounds; no organomegaly or masses detected.   Musco: Warm bil, no deformities or joint swelling noted.   Neuro: alert, no focal deficits noted.    Skin: Warm, no lesions or rashes    Lab Results:  CBC    Component Value Date/Time   WBC 8.5 03/06/2023 1307   RBC 4.31 03/06/2023 1307   HGB 12.8 03/06/2023 1307   HCT 38.0 03/06/2023 1307   PLT 183.0 03/06/2023 1307   MCV 88.2 03/06/2023 1307   MCH 29.4 02/02/2022 1535   MCHC 33.7 03/06/2023 1307   RDW 12.5 03/06/2023 1307   LYMPHSABS 2,681 02/02/2022 1535   MONOABS 0.6 01/21/2017 0909   EOSABS 203 02/02/2022 1535   BASOSABS 49 02/02/2022 1535    BMET    Component Value Date/Time   NA 141 03/06/2023 1307   K 4.2 03/06/2023 1307   CL 104 03/06/2023 1307   CO2 29 03/06/2023 1307   GLUCOSE 88 03/06/2023 1307   BUN 20 03/06/2023 1307   CREATININE 0.73 03/06/2023 1307   CREATININE 0.69 02/02/2022 1535   CALCIUM  9.5 03/06/2023 1307   CALCIUM  9.8 03/06/2023 1307    BNP No results found for: "BNP"  ProBNP No results found for: "PROBNP"  Imaging: No results found.  Administration History     None           No data to display          No results found for: "NITRICOXIDE"      Assessment & Plan:   Assessment and Plan    Snoring, daytime sleepiness and fatigue. Suspected sleep apnea is indicated by symptoms of daytime sleepiness, fatigue, difficulty falling asleep, and snoring . Discussed potential impacts on memory, mood, and increased risk of stroke and cardiovascular conditions. Untreated sleep apnea can increase risks of stroke, cardiovascular issues, and hypertension. A home sleep study is preferred for her comfort and history. Order a home sleep study through [facility].  - discussed how weight can impact sleep and risk for sleep disordered breathing - discussed options to assist with weight loss: combination  of diet  modification, cardiovascular and strength training exercises   - had an extensive discussion regarding the adverse health consequences related to untreated sleep disordered breathing - specifically discussed the risks for hypertension, coronary artery disease, cardiac dysrhythmias, cerebrovascular disease, and diabetes - lifestyle modification discussed   - discussed how sleep disruption can increase risk of accidents, particularly when driving - safe driving practices were discussed  Insomnia-healthy sleep regimen discussed in detail.  Stress reducers discussed.  Avoid sleep aids if possible.    Fatigue   Chronic fatigue presents with daytime sleepiness, low energy, and brain fog. Sleep apnea is a differential diagnosis, home sleep study has been ordered   Memory changes   Memory changes and brain fog are potentially related to sleep apnea. 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 A previous MRI showed an old infarct in the right side of the brain.  Continue follow-up primary care.  Hypertension   Hypertension is well-managed with medication.   Roena Clark, NP 07/08/2023

## 2023-07-08 NOTE — Patient Instructions (Signed)
Set up for home sleep study  Work on healthy sleep regimen  Do not drive if sleepy  Follow up in 6 weeks to discuss sleep study results and treatment plan .

## 2023-07-08 NOTE — Progress Notes (Signed)
 Epworth Sleepiness Scale  Use the following scale to choose the most appropriate number for each situation. 0 Would never nod off 1  Slight  chance of nodding off 2 Moderate chance of nodding off 3 High chance of nodding off  Sitting and reading: 0 Watching TV: 0 Sitting, inactive, in a public place (e.g., in a meeting, theater, or dinner event): 0 As a passenger in a car for an hour or more without stopping for a break: 0 Lying down to rest when circumstances permit:3 Sitting and talking to someone: 0 Sitting quietly after a meal without alcohol: 0 In a car, while stopped for a few  minutes in traffic or at a light: 0  TOTOAL: 3

## 2023-07-23 ENCOUNTER — Encounter

## 2023-07-23 DIAGNOSIS — R0683 Snoring: Secondary | ICD-10-CM | POA: Diagnosis not present

## 2023-07-23 DIAGNOSIS — G4719 Other hypersomnia: Secondary | ICD-10-CM

## 2023-07-23 DIAGNOSIS — R0681 Apnea, not elsewhere classified: Secondary | ICD-10-CM | POA: Diagnosis not present

## 2023-08-03 ENCOUNTER — Telehealth: Payer: Self-pay | Admitting: Pulmonary Disease

## 2023-08-03 DIAGNOSIS — R0683 Snoring: Secondary | ICD-10-CM | POA: Diagnosis not present

## 2023-08-03 NOTE — Telephone Encounter (Signed)
 Call patient  Sleep study result  Date of study: 07/23/2023  Impression: Negative study for significant sleep disordered breathing with AHI of 0.7, no significant oxygen desaturations  Recommendation:  May consider in-lab sleep study if clinical concern for sleep disordered breathing remains high  Clinical follow-up of symptoms

## 2023-08-05 DIAGNOSIS — H906 Mixed conductive and sensorineural hearing loss, bilateral: Secondary | ICD-10-CM | POA: Diagnosis not present

## 2023-08-21 ENCOUNTER — Encounter: Payer: Self-pay | Admitting: Family Medicine

## 2023-08-21 ENCOUNTER — Ambulatory Visit (INDEPENDENT_AMBULATORY_CARE_PROVIDER_SITE_OTHER): Payer: Medicare Other | Admitting: Family Medicine

## 2023-08-21 VITALS — BP 106/68 | HR 73 | Temp 97.8°F | Wt 105.0 lb

## 2023-08-21 DIAGNOSIS — I10 Essential (primary) hypertension: Secondary | ICD-10-CM

## 2023-08-21 DIAGNOSIS — E559 Vitamin D deficiency, unspecified: Secondary | ICD-10-CM

## 2023-08-21 DIAGNOSIS — N1832 Chronic kidney disease, stage 3b: Secondary | ICD-10-CM

## 2023-08-21 DIAGNOSIS — E063 Autoimmune thyroiditis: Secondary | ICD-10-CM | POA: Diagnosis not present

## 2023-08-21 DIAGNOSIS — I6523 Occlusion and stenosis of bilateral carotid arteries: Secondary | ICD-10-CM | POA: Diagnosis not present

## 2023-08-21 DIAGNOSIS — E782 Mixed hyperlipidemia: Secondary | ICD-10-CM | POA: Diagnosis not present

## 2023-08-21 DIAGNOSIS — R5383 Other fatigue: Secondary | ICD-10-CM | POA: Diagnosis not present

## 2023-08-21 DIAGNOSIS — M810 Age-related osteoporosis without current pathological fracture: Secondary | ICD-10-CM

## 2023-08-21 DIAGNOSIS — E538 Deficiency of other specified B group vitamins: Secondary | ICD-10-CM

## 2023-08-21 DIAGNOSIS — F09 Unspecified mental disorder due to known physiological condition: Secondary | ICD-10-CM

## 2023-08-21 DIAGNOSIS — Z8673 Personal history of transient ischemic attack (TIA), and cerebral infarction without residual deficits: Secondary | ICD-10-CM | POA: Diagnosis not present

## 2023-08-21 DIAGNOSIS — R413 Other amnesia: Secondary | ICD-10-CM

## 2023-08-21 LAB — T4, FREE: Free T4: 0 ng/dL — ABNORMAL LOW (ref 0.60–1.60)

## 2023-08-21 LAB — TSH: TSH: 83.11 u[IU]/mL — ABNORMAL HIGH (ref 0.35–5.50)

## 2023-08-21 LAB — T3, FREE: T3, Free: 2.7 pg/mL (ref 2.3–4.2)

## 2023-08-21 MED ORDER — ATORVASTATIN CALCIUM 10 MG PO TABS: 10.0000 mg | ORAL_TABLET | Freq: Every day | ORAL | 3 refills | Status: DC

## 2023-08-21 MED ORDER — DONEPEZIL HCL 5 MG PO TABS: 5.0000 mg | ORAL_TABLET | Freq: Every day | ORAL | 1 refills | Status: DC

## 2023-08-21 MED ORDER — ESCITALOPRAM OXALATE 5 MG PO TABS: 5.0000 mg | ORAL_TABLET | Freq: Every day | ORAL | 1 refills | Status: DC

## 2023-08-21 MED ORDER — LISINOPRIL 40 MG PO TABS: 40.0000 mg | ORAL_TABLET | Freq: Every day | ORAL | 1 refills | Status: DC

## 2023-08-21 NOTE — Progress Notes (Signed)
 Cynthia Powell , 1945/10/19, 78 y.o., female MRN: [id number] Patient Care Team    Relationship Specialty Notifications Start End  Catherine Charlies LABOR, DO PCP - General Family Medicine  03/02/15   Elma Zachary RAMAN  Optometry  09/30/18   [facility]  Audiology  09/30/18   Marvis Ronal BIRCH., MD Referring Physician Gastroenterology  10/11/21   Beryl Donnice BRAVO, MD Referring Physician Endocrinology  03/06/23   Dolphus Reiter, MD Consulting Physician Rheumatology  03/06/23   Bernie Lamar PARAS, MD Consulting Physician Cardiology  03/06/23   Gregg Lek, MD Consulting Physician Neurology  03/06/23     Chief Complaint  Patient presents with   Hypertension     Subjective: Cynthia Powell is a 78 y.o. female present for chronic condition management Hypertension/hyperlipidemia-/CVA/TIA:Patient reports compliance with lisinopril  40 mg QD.  She is uncertain if she is taking amlodipine  2.5 mg daily? Patient denies dizziness, hyperglycemic or hypoglycemic events. Patient denies numbness, tingling in the extremities or nonhealing wounds of feet.  Pt does  take daily baby ASA.  She is now taking Lipitor 10 mg daily. Diet: does not closely monitor.  Exercise: does not routinely exercise.  RF: HTN, HLD, FHX stroke (mother), TIA, CVA  Hypothyroid:Pt reports she misunderstood the directions and is taking the Cytomel  5 daily but stopped the Synthroid  50 mg daily.  She is having hypothyroid symptoms, she has been able to gain weight was a positive.   Vit d deficiency/osteoporosis: Pt continues to take 5000 u vit d once a week. She declined fosamax start  MRI brain 03/06/2022: FINDINGS: MRI HEAD FINDINGS Brain: No acute infarction, hemorrhage, hydrocephalus, extra-axial collection or mass lesion. 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 Moderate patchy and confluence T2 hyperintensity in the deep cerebral, periventricular and subcortical white matter, consistent with  chronic small-vessel disease. No foci of abnormal susceptibility. Bilateral hippocampal atrophy. No abnormal intracranial enhancement. Vascular: Normal flow voids. Skull and upper cervical spine: Normal marrow signal. Sinuses/Orbits: No acute or significant finding. Other: None. MRA HEAD FINDINGS Anterior circulation: No large vessel occlusion, aneurysm or hemodynamically significant stenosis. Posterior circulation: No large vessel occlusion, aneurysm or hemodynamically significant stenosis. Anatomic variants: Right posterior communicating artery is not visualized. MRA NECK FINDINGS Aortic arch: Three-vessel arch configuration. Arch vessel origins are patent. Right carotid system: No significant stenosis. Left carotid system: No significant stenosis. Vertebral arteries: No significant stenosis. Other: None IMPRESSION: No acute intracranial pathology, large vessel occlusion, or hemodynamically significant stenosis in the head or neck vessels.        05/30/2023    9:16 AM 09/12/2022    1:32 PM 05/16/2022    2:11 PM 02/02/2022    3:01 PM 10/11/2021    1:21 PM  Depression screen PHQ 2/9  Decreased Interest 0 0 0 0 0  Down, Depressed, Hopeless 0 0 0 0 0  PHQ - 2 Score 0 0 0 0 0  Altered sleeping 0 2     Tired, decreased energy 3 3     Change in appetite 0 1     Feeling bad or failure about yourself  0 0     Trouble concentrating 1 1     Moving slowly or fidgety/restless 1 0     Suicidal thoughts 0 0     PHQ-9 Score 5 7     Difficult doing work/chores Very difficult Somewhat difficult       Allergies  Allergen Reactions  Iodinated Contrast Media     Other reaction(s): UNCONSCIOUSNESS   Spinach    Corn-Containing Products Diarrhea   Fish Allergy Diarrhea    Pt c/o Bluefin tuna and trout causing diarrhea.   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   Hyperlipidemia    using Krill oil; refuses statin   Hypertension    Murmur, cardiac 02/02/2022   Osteoporosis    Polyarthralgia 04/05/2020   Sensory hearing loss, bilateral 2017   Bilateral hearing aids. [facility]   Snoring 09/27/2020   Spider veins of both lower extremities 09/12/2015   Thyroid  disease 1980   Trigger finger    Wears hearing aid in both ears    Past Surgical History:  Procedure Laterality Date   BREAST SURGERY  1990   biopsy   CESAREAN SECTION     x2   COLONOSCOPY  05/16/2021   COLPOSCOPY     EYE SURGERY  cataracts removed 2015   TONSILLECTOMY  1954   TUBAL LIGATION  1973   Family History  Problem Relation Age of Onset   Arthritis Mother    Diabetes Mother    Stroke Mother        dementia after   Hypertension Mother    Fibromyalgia Mother    Varicose Veins Mother    Heart disease Father 86   Hypertension Father    Colon cancer Father        mets liver   Pernicious anemia Father    Cancer Father    Breast cancer Sister        breast cancer   Hearing loss Maternal Grandfather    Melanoma Daughter        melanoma 2009   Diabetes Daughter    ADD /  ADHD Daughter    Anxiety disorder Daughter    Learning disabilities Daughter    Lymphoma Daughter        Brain mets- on chemo ([facility])   OCD Daughter    Mental illness Daughter    Anxiety disorder Daughter    Asthma Daughter    Cancer Daughter    ADD / ADHD Daughter    Cancer Daughter    Diabetes Daughter    Learning disabilities Daughter    Allergies as of 08/21/2023       Reactions   Iodinated Contrast Media  Other reaction(s): UNCONSCIOUSNESS   Spinach    Corn-containing Products Diarrhea   Fish Allergy Diarrhea   Pt c/o Bluefin tuna and trout causing diarrhea.   Rice Diarrhea        Medication List        Accurate as of August 21, 2023  4:19 PM. If you have any questions, ask your nurse or doctor.          amLODipine  2.5 MG tablet Commonly known as: NORVASC  Take 1 tablet (2.5 mg total) by mouth daily.   aspirin  EC 81 MG tablet Take 1 tablet (81 mg total) by mouth daily. Swallow whole.   atorvastatin  10 MG tablet Commonly known as: LIPITOR Take 1 tablet (10 mg total) by mouth daily.   cholecalciferol 1000 units tablet Commonly known as: VITAMIN D  Take 5,000 Units by mouth daily.   donepezil  5 MG tablet Commonly known as: ARICEPT  Take 1 tablet (5 mg total) by mouth at bedtime.   escitalopram  5 MG tablet Commonly known as: Lexapro  Take 1 tablet (5 mg total) by mouth daily.   levothyroxine  50 MCG tablet Commonly known as: SYNTHROID  Take 1 tablet (50 mcg total) by mouth daily before breakfast.   liothyronine  5 MCG tablet Commonly known as: Cytomel  Take 1 tablet (5 mcg total) by mouth daily.   lisinopril  40 MG tablet Commonly known as: ZESTRIL  Take 1 tablet (40 mg total) by mouth daily.   VITAMIN B 12 PO Take 1 tablet by mouth daily.        All past medical history, surgical history, allergies, family history, immunizations andmedications were updated in the EMR today and reviewed under the history and medication portions of their EMR.      ROS: Negative, with the exception of above mentioned in HPI   Objective:  BP 106/68   Pulse 73   Temp 97.8 F (36.6 C)   Wt 105 lb (47.6 kg)   SpO2 95%   BMI 20.51 kg/m  Body mass index is 20.51 kg/m. Physical Exam Vitals and nursing note reviewed.  Constitutional:      General: She is not in acute distress.    Appearance: Normal appearance. She is not ill-appearing, toxic-appearing or diaphoretic.  HENT:     Head: Normocephalic and atraumatic.   Eyes:     General: No scleral icterus.       Right eye: No discharge.        Left eye: No discharge.     Extraocular Movements: Extraocular movements intact.     Conjunctiva/sclera: Conjunctivae normal.     Pupils: Pupils are equal, round, and reactive to light.    Cardiovascular:     Rate and Rhythm: Normal rate and regular rhythm.     Heart sounds: Murmur heard.  Pulmonary:     Effort: Pulmonary effort is normal. No respiratory distress.     Breath sounds: Normal breath sounds. No wheezing, rhonchi or rales.   Musculoskeletal:     Right lower leg: No edema.     Left lower leg: No edema.   Skin:    General: Skin is warm.     Findings: No rash.   Neurological:     Mental Status: She is alert and oriented to person, place, and time. Mental status is at baseline.     Motor: No weakness.     Gait: Gait normal.   Psychiatric:        Mood and Affect: Mood normal.  Behavior: Behavior normal.        Thought Content: Thought content normal.        Judgment: Judgment normal.     No results found. No results found. No results found for this or any previous visit (from the past 24 hours).   Assessment/Plan: Cynthia Powell is a 78 y.o. female present for OV for chronic condition management Hypothyroidism, unspecified type/elevated glucose Thyroid /TSH levels have been normal since patient has been complianttaking the taking the correct dose prescribed.  She had concerns and asked for referral to endocrine which  was placed for her.  They reviewed her history and repeated her TSH which again was normal on the levothyroxine  50 mcg daily. Unfortunately patient stopped the levothyroxine  50 mcg daily.  When she started the Cytomel  5.  We elected to recollect the TSH/thyroid  labs today once we get the results we will consider further management with other Cytomel  on restarting the levothyroxine  depending upon the results.  Stage 3b chronic kidney disease (HCC)/vitamin D  deficiency/osteoporosis Patient has been strongly encouraged to obtain adequate hydration. -We will attempt to avoid long-term NSAIDs if possible. -GFR stable at 53 CBC, BMP, PTH/ca, vitamin D  up-to-date   Hypertension/hyperlipidemia/TIA/h/o CVA/cognitive decline/fh alz dementia: Stable Continue lisinopril  40 mg QD. Continue amlodipine  2.5 mg daily-she was asked to go home and see if this has been a part of her normal regimen.  If she has not been taking it, I would recommend she does not start it now.  She will call us  back to let us  know so we can either refill or discontinue this medication on her med list for her. Continue Lipitor 10 mg daily Continue baby aspirin  Labs UTD  Cognitive decline: Continue Aricept  5 nightly  Anxious/frustrated Stable continue lexapro  5 mg We again discussed this medication is being prescribed for anxiety, not depression  Reviewed expectations re: course of current medical issues. Discussed self-management of symptoms. Outlined signs and symptoms indicating need for more acute intervention. Patient verbalized understanding and all questions were answered. Patient received an After-Visit Summary.    Orders Placed This Encounter  Procedures   TSH   T3, free   T4, free    Meds ordered this encounter  Medications   atorvastatin  (LIPITOR) 10 MG tablet    Sig: Take 1 tablet (10 mg total) by mouth daily.    Dispense:  90 tablet    Refill:  3   donepezil  (ARICEPT ) 5 MG tablet    Sig: Take 1  tablet (5 mg total) by mouth at bedtime.    Dispense:  90 tablet    Refill:  1   escitalopram  (LEXAPRO ) 5 MG tablet    Sig: Take 1 tablet (5 mg total) by mouth daily.    Dispense:  90 tablet    Refill:  1   lisinopril  (ZESTRIL ) 40 MG tablet    Sig: Take 1 tablet (40 mg total) by mouth daily.    Dispense:  90 tablet    Refill:  1    Referral Orders  No referral(s) requested today     Note is dictated utilizing voice recognition software. Although note has been proof read prior to signing, occasional typographical errors still can be missed. If any questions arise, please do not hesitate to call for verification.   electronically signed by:  Charlies Bellini, DO  [facility] [place]

## 2023-08-21 NOTE — Patient Instructions (Signed)

## 2023-08-22 ENCOUNTER — Ambulatory Visit: Payer: Self-pay | Admitting: Family Medicine

## 2023-08-22 MED ORDER — LEVOTHYROXINE SODIUM 50 MCG PO TABS: 50.0000 ug | ORAL_TABLET | Freq: Every day | ORAL | 1 refills | Status: DC

## 2023-08-22 MED ORDER — LIOTHYRONINE SODIUM 5 MCG PO TABS: 5.0000 ug | ORAL_TABLET | Freq: Every day | ORAL | 1 refills | Status: DC

## 2023-08-22 NOTE — Telephone Encounter (Signed)
 Noted- dc amlodipine  from [employer]

## 2023-08-22 NOTE — Addendum Note (Signed)
 Addended by: Dolph Tavano A on: 08/22/2023 11:40 AM   Modules accepted: Orders

## 2023-08-22 NOTE — Telephone Encounter (Signed)
 Please call patient  1.  Ask her if she has been taking the amlodipine  2.5 mg daily?  She was supposed to see if this is not her daily medication regiment so I know if I need to refill it or discontinue it from her med list.   -If she has not been taking the amlodipine , then I do not want her to restart it.  Please advise me on her answer   2.  Her thyroid  is severely under replaced.   -She needs to start the levothyroxine /Synthroid  50 mcg daily AND take the Cytomel  as well.  She will be taking 2 pills for her thyroid  condition daily.  I have refilled her meds  Please make sure she completely understands the instructions on the thyroid  medications.  Follow-up in 8-10 weeks with provider on her chronic conditions and retesting of thyroid 

## 2023-08-26 ENCOUNTER — Institutional Professional Consult (permissible substitution): Admitting: Neurology

## 2023-09-06 ENCOUNTER — Encounter (HOSPITAL_BASED_OUTPATIENT_CLINIC_OR_DEPARTMENT_OTHER): Payer: Self-pay | Admitting: Adult Health

## 2023-09-06 ENCOUNTER — Ambulatory Visit (HOSPITAL_BASED_OUTPATIENT_CLINIC_OR_DEPARTMENT_OTHER): Admitting: Adult Health

## 2023-09-06 VITALS — BP 131/61 | HR 67 | Ht 60.0 in | Wt 100.0 lb

## 2023-09-06 DIAGNOSIS — G4710 Hypersomnia, unspecified: Secondary | ICD-10-CM | POA: Diagnosis not present

## 2023-09-06 DIAGNOSIS — R400 Somnolence: Secondary | ICD-10-CM | POA: Insufficient documentation

## 2023-09-06 NOTE — Progress Notes (Signed)
 @Patient  ID: Cynthia Powell, female    DOB: 31-Oct-1945, 78 y.o.   MRN: [id number]  Chief Complaint  Patient presents with   Follow-up    Excessive daytime sleepiness    Referring provider: Catherine Charlies LABOR, DO  HPI: 78 year old female seen for sleep consult May 2025 for restless sleep and daytime sleepiness with negative workup for sleep apnea  TEST/EVENTS :  Home sleep study Jul 23, 2023 negative for sleep apnea AHI 0 no nocturnal desaturations  09/06/2023 Follow up: Daytime sleepiness  Patient presents for a 3-month follow-up.  Patient was seen last visit for sleep consult for restless sleep, daytime sleepiness and intermittent snoring.  She was set up for home sleep study completed on Jul 23, 2023 that showed no sleep apnea and no daytime sleepiness.  No nocturnal desaturations.  We discussed her sleep study results in detail. Patient has a history of Hashimoto thyroiditis.  She is on thyroid  replacement.  TSH in January 2025 was normal.  Patient was recently seen by her primary care provider and TSH was greater than 80.  Patient had misunderstood and stopped taking her Synthroid .  Care her thyroid  medications have been adjusted.  Allergies  Allergen Reactions   Iodinated Contrast Media     Other reaction(s): UNCONSCIOUSNESS   Spinach    Corn-Containing Products Diarrhea   Fish Allergy Diarrhea    Pt c/o Bluefin tuna and trout causing diarrhea.   Rice Diarrhea    Immunization History  Administered Date(s) Administered   Fluad Quad(high Dose 65+) 12/01/2018, 12/22/2021   Influenza Split 12/26/2007, 10/21/2008, 11/15/2011   Influenza Whole 01/01/2002   Influenza, Quadrivalent, Recombinant, Inj, Pf 11/21/2017   Influenza-Unspecified 01/31/2004, 02/10/2005, 11/20/2013, 10/20/2014, 11/22/2016, 11/29/2019, 11/13/2020, 11/12/2022   MMR 09/23/2012, 10/22/2012   PFIZER Comirnaty(Gray Top)Covid-19 Tri-Sucrose Vaccine 12/29/2021   PFIZER(Purple Top)SARS-COV-2 Vaccination 03/21/2019,  04/18/2019, 12/25/2019, 07/04/2020, 11/09/2020   Pneumococcal Conjugate-13 12/31/2011   Pneumococcal Polysaccharide-23 11/05/2006, 01/28/2017   Respiratory Syncytial Virus Vaccine,Recomb Aduvanted(Arexvy) 10/24/2021   Td 01/01/2002   Td (Adult), 2 Lf Tetanus Toxid, Preservative Free 01/01/2002   Tdap 10/02/2018   Zoster Recombinant(Shingrix ) 10/02/2018, 12/10/2018   Zoster, Live 06/25/2013    Past Medical History:  Diagnosis Date   Abnormal Pap smear of vagina [phone]   pt had h/o abnl pap; does not desire future screening/PAP   ADHD (attention deficit hyperactivity disorder), 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   Allergy    seasonal and food   Anemia recently (2023)   Arthritis    osteoarthritis   Benign neoplasm of colon    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   Hyperlipidemia    using Krill oil; refuses statin   Hypertension    Murmur, cardiac 02/02/2022   Osteoporosis    Polyarthralgia 04/05/2020   Sensory hearing loss, bilateral 2017   Bilateral hearing aids. [facility]  Snoring 09/27/2020   Spider veins of both lower extremities 09/12/2015   Thyroid  disease 1980   Trigger finger    Wears hearing aid in both ears     Tobacco History: Social History   Tobacco Use  Smoking Status Never   Passive exposure: Past  Smokeless Tobacco Never  Tobacco Comments   Parents smoked a lot, I tried but quit when young   Counseling given: Not Answered Tobacco comments:  Parents smoked a lot, I tried but quit when young   Outpatient Medications Prior to Visit  Medication Sig Dispense Refill   aspirin  EC 81 MG tablet Take 1 tablet (81 mg total) by mouth daily. Swallow whole. 30 tablet 12   atorvastatin  (LIPITOR) 10 MG tablet Take 1 tablet (10 mg total) by mouth daily. 90 tablet 3   cholecalciferol (VITAMIN D ) 1000 units tablet Take 5,000 Units by mouth daily.     Cyanocobalamin  (VITAMIN B 12 PO) Take 1 tablet by mouth daily.     donepezil  (ARICEPT ) 5 MG tablet Take 1 tablet (5 mg total) by mouth at bedtime. 90 tablet 1   escitalopram  (LEXAPRO ) 5 MG tablet Take 1 tablet (5 mg total) by mouth daily. 90 tablet 1   levothyroxine  (SYNTHROID ) 50 MCG tablet Take 1 tablet (50 mcg total) by mouth daily before breakfast. 90 tablet 1   liothyronine  (CYTOMEL ) 5 MCG tablet Take 1 tablet (5 mcg total) by mouth daily. 90 tablet 1   lisinopril  (ZESTRIL ) 40 MG tablet Take 1 tablet (40 mg total) by mouth daily. 90 tablet 1   No facility-administered medications prior to visit.     Review of Systems:   Constitutional:   No  weight loss, night sweats,  Fevers, chills, fatigue, or  lassitude.  HEENT:   No headaches,  Difficulty swallowing,  Tooth/dental problems, or  Sore throat,                No sneezing, itching, ear ache, nasal congestion, post nasal drip,   CV:  No chest pain,  Orthopnea, PND, swelling in lower extremities, anasarca, dizziness, palpitations, syncope.   GI  No heartburn, indigestion, abdominal pain, nausea, vomiting, diarrhea, change in bowel habits, loss of appetite, bloody stools.   Resp: No shortness of breath with exertion or at rest.  No excess mucus, no productive cough,  No non-productive cough,  No coughing up of blood.  No change in color of mucus.  No wheezing.  No chest wall deformity  Skin: no rash or lesions.  GU: no dysuria, change in color of urine, no urgency or frequency.  No flank pain, no hematuria   MS:  No joint pain or  swelling.  No decreased range of motion.  No back pain.    Physical Exam  BP 131/61   Pulse 67   Ht 5' (1.524 m)   Wt 100 lb (45.4 kg)   SpO2 100%   BMI 19.53 kg/m   GEN: A/Ox3; pleasant , NAD, well nourished    HEENT:  [place]/AT, NOSE-clear, THROAT-clear, no lesions, no postnasal drip or exudate noted.   NECK:  Supple w/ fair ROM; no JVD; normal carotid impulses w/o bruits; no thyromegaly or nodules palpated; no lymphadenopathy.    RESP  Clear  P & A; w/o, wheezes/ rales/ or rhonchi. no accessory muscle use, no dullness to percussion  CARD:  RRR, no m/r/g, no peripheral edema, pulses intact, no cyanosis or clubbing.  GI:   Soft & nt; nml bowel sounds;  no organomegaly or masses detected.   Musco: Warm bil, no deformities or joint swelling noted.   Neuro: alert, no focal deficits noted.    Skin: Warm, no lesions or rashes    Lab Results:      BNP No results found for: BNP  ProBNP No results found for: PROBNP  Imaging: No results found.  Administration History     None           No data to display          No results found for: NITRICOXIDE      Assessment & Plan:   Daytime sleepiness Daytime sleepiness and restless sleep-Home sleep study shows no sleep apnea.  We reviewed her sleep study results in detail.  Encouraged on healthy sleep regimen. Advised to sleep with her head of the bed elevated at 30 degrees and avoid sleeping on her back  Plan  Patient Instructions  Work on healthy sleep regimen.  Do not drive if sleepy  Sleep with head of bed elevated  Avoid sleeping on your back  Follow up As needed        Madelin Stank, NP 09/06/2023

## 2023-09-06 NOTE — Patient Instructions (Signed)
 Work on healthy sleep regimen.  Do not drive if sleepy  Sleep with head of bed elevated  Avoid sleeping on your back  Follow up As needed

## 2023-09-06 NOTE — Assessment & Plan Note (Signed)
 Daytime sleepiness and restless sleep-Home sleep study shows no sleep apnea.  We reviewed her sleep study results in detail.  Encouraged on healthy sleep regimen. Advised to sleep with her head of the bed elevated at 30 degrees and avoid sleeping on her back  Plan  Patient Instructions  Work on healthy sleep regimen.  Do not drive if sleepy  Sleep with head of bed elevated  Avoid sleeping on your back  Follow up As needed

## 2023-10-02 ENCOUNTER — Encounter: Payer: Self-pay | Admitting: Neurology

## 2023-10-02 ENCOUNTER — Ambulatory Visit (INDEPENDENT_AMBULATORY_CARE_PROVIDER_SITE_OTHER): Admitting: Neurology

## 2023-10-02 VITALS — BP 149/81 | HR 89 | Ht 60.0 in | Wt 103.0 lb

## 2023-10-02 DIAGNOSIS — G3184 Mild cognitive impairment, so stated: Secondary | ICD-10-CM

## 2023-10-02 MED ORDER — ESCITALOPRAM OXALATE 10 MG PO TABS: 10.0000 mg | ORAL_TABLET | Freq: Every day | ORAL | 0 refills | Status: DC

## 2023-10-02 NOTE — Progress Notes (Signed)
 [facility]  PATIENT: Cynthia Powell DOB: 1945/03/11  REQUESTING CLINICIAN: Catherine, Renee A, DO HISTORY FROM: Patient  REASON FOR VISIT: Memory deficit    HISTORICAL  CHIEF COMPLAINT:  Chief Complaint  Patient presents with   Memory Loss    Room 13 Started notice changes about 1 year she think that it could be related to his thyroid     INTERVAL HISTORY 10/02/2023:  Discussed the use of AI scribe software for clinical note transcription with the patient, who gave verbal consent to proceed.  Patient presents today for follow up for her cognitive impairment. She is accompanied by her husband. She experiences worsening short-term memory loss, initially attributing it to her ADD. She has difficulty remembering recent events, such as conversations or tasks shortly after they occur. Her husband provides examples, such as her forgetting to avoid ordering items she already has or not recalling plans he mentioned earlier. This issue has been ongoing for over a year and a half, with a noted decline since her last visit in January 2024. She maintains calendars to help manage her forgetfulness.  She has a history of Hashimoto's thyroiditis and reports an autoimmune attack approximately a year and a half ago, which necessitated dietary changes to gluten-free and dairy-free options. She is currently taking levothyroxine  and liothyronine  for her thyroid  condition. There was a period when she was not taking both medications as prescribed. Her TSH levels were noted to be elevated as of last June.  Her family history includes her mother, who developed memory problems at the age of 1 and was diagnosed with dementia, passing away at 33. This family history contributes to her concern about her own memory issues.  She experiences fatigue and takes naps during the day, although she reports difficulty falling asleep until about 2 AM, waking up between 10 AM and 12 PM. A previous sleep study  did not reveal any significant findings. She also reports having had two small strokes in the past, which she acknowledges could impact her memory.  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 She expresses stress related to family dynamics, particularly with her daughter and granddaughter, which may contribute to her overall anxiety and memory concerns.    INTERVAL HISTORY 03/20/2022:  Patient presents today for follow-up, last visit was in November 2022.  At that time she was complaining of memory concerns but was also found to have increased stressors with her husband being diagnosed with a PE and her cat being very sick.  At that time, her mini mental status evaluation was 30/30.  Patient was diagnosed with adjustment disorder and advised to continue current medications.  Today she is reporting her memory is worse, short-term and long-term. She is more forgetful about recent events, sometimes she will tell her daughter that she will take her to the store and then forget about it.  Now she has to carry a calendar or if she will forget everything. She still cooks, denies burning pots, still drives and no recent accident, she has not been lost in familiar places.  She does not have any issue with family members names or using items around the house.  She is still functioning independently.  Her husband is taking care of the bills, he has already always done so  In term of the abnormal MRI, I have informed patient that the strokes are chronic and she is currently on the best medical management  with aspirin , Lipitor and Zestril .    HISTORY OF PRESENT ILLNESS:  This is a 78 year old woman with past medical history of hearing loss, rotator cuff impairment, hypertension and hypothyroidism who is presenting with memory decline.  Patient reported she noted her memory decline since February of this year.  She is more forgetful,  she misplaced items and cannot remember certain events.  She does live with her husband who did not complain about her memory, but mentioned that daughter has complained about her memory.  She currently drive, denies any accident, denies being lost in family or places.  She reported her husband handle the finances, he has always handled the finances.  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  Her older daughter also has type 1 diabetes and she always worry about her and now her granddaughter who lives with patient has type 1 diabetes too.  Husband recently diagnosed with PE and her cat is sick and she is worried that she will have to put her down in the next few weeks.    OTHER MEDICAL CONDITIONS: Rotator cuff impingement, Hearing los, hypertension, hypothyroidism.   REVIEW OF SYSTEMS: Full 14 system review of systems performed and negative with exception of: as noted in the HPI  ALLERGIES: Allergies  Allergen Reactions   Iodinated Contrast Media     Other reaction(s): UNCONSCIOUSNESS   Spinach    Corn-Containing Products Diarrhea   Fish Allergy Diarrhea    Pt c/o Bluefin tuna and trout causing diarrhea.   Rice Diarrhea    HOME MEDICATIONS: Outpatient Medications Prior to Visit  Medication Sig Dispense Refill   aspirin  EC 81 MG tablet Take 1 tablet (81 mg total) by mouth daily. Swallow whole. 30 tablet 12   atorvastatin  (LIPITOR) 10 MG tablet Take 1 tablet (10 mg total) by mouth daily. 90 tablet 3   cholecalciferol (VITAMIN D ) 1000 units tablet Take 5,000 Units by mouth daily.     Cyanocobalamin  (VITAMIN B 12 PO) Take 1 tablet by mouth daily.     donepezil  (ARICEPT ) 5 MG tablet Take 1 tablet (5 mg total) by mouth at bedtime. 90 tablet 1   levothyroxine  (SYNTHROID ) 50 MCG  tablet Take 1 tablet (50 mcg total) by mouth daily before breakfast. 90 tablet 1   liothyronine  (CYTOMEL ) 5 MCG tablet Take 1 tablet (5 mcg total) by mouth daily. 90 tablet 1   lisinopril  (ZESTRIL ) 40 MG tablet Take 1 tablet (40 mg total) by mouth daily. 90 tablet 1   escitalopram  (LEXAPRO ) 5 MG tablet Take 1 tablet (5 mg total) by mouth daily. 90 tablet 1   No facility-administered medications prior to visit.    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   Hyperlipidemia    using Krill oil; refuses statin   Hypertension    Murmur, cardiac 02/02/2022   Osteoporosis    Polyarthralgia 04/05/2020   Sensory hearing loss, bilateral 2017   Bilateral hearing aids. [facility]   Snoring 09/27/2020   Spider veins of both lower extremities 09/12/2015   Thyroid  disease 1980   Trigger finger    Wears hearing aid in both ears     PAST SURGICAL HISTORY: Past Surgical History:  Procedure Laterality Date   BREAST SURGERY  1990   biopsy   CESAREAN SECTION     x2   COLONOSCOPY  05/16/2021   COLPOSCOPY     EYE SURGERY  cataracts  removed 2015   TONSILLECTOMY  1954   TUBAL LIGATION  1973    FAMILY HISTORY: Family History  Problem Relation Age of Onset   Arthritis Mother    Diabetes Mother    Stroke Mother        dementia after   Hypertension Mother    Fibromyalgia Mother    Varicose Veins Mother    Heart disease Father 35   Hypertension Father    Colon cancer Father        mets liver   Pernicious anemia Father    Cancer Father    Breast cancer Sister        breast cancer   Hearing loss Maternal Grandfather    Melanoma Daughter        melanoma 2009   Diabetes Daughter    ADD / ADHD Daughter    Anxiety disorder Daughter    Learning disabilities Daughter    Lymphoma Daughter        Brain mets- on chemo ([facility])   OCD Daughter    Mental illness Daughter    Anxiety disorder Daughter    Asthma Daughter    Cancer Daughter    ADD / ADHD Daughter    Cancer Daughter    Diabetes Daughter    Learning disabilities Daughter     SOCIAL HISTORY: Social History   Socioeconomic History   Marital status: Married    Spouse name: Not on file   Number of children: Not on file   Years of education: Not on file   Highest education level: Bachelor's degree (e.g., BA, AB, BS)  Occupational History   Not on file  Tobacco Use   Smoking status: Never    Passive exposure: Past   Smokeless tobacco: Never   Tobacco comments:    Parents smoked a lot, I tried but quit when young  Vaping Use   Vaping status: Never Used  Substance and Sexual Activity   Alcohol use: Not Currently    Comment: rarely glass of wine a year but not for several years   Drug use: Never   Sexual activity: Yes    Birth control/protection: None    Comment: not needed  Other Topics Concern   Not on file  Social History Narrative  Not on file   Social Drivers of Health   Financial Resource Strain: Low Risk  (08/17/2023)   Overall Financial Resource Strain (CARDIA)    Difficulty of Paying Living Expenses: Not very hard  Food  Insecurity: No Food Insecurity (08/17/2023)   Hunger Vital Sign    Worried About Running Out of Food in the Last Year: Never true    Ran Out of Food in the Last Year: Never true  Transportation Needs: No Transportation Needs (08/17/2023)   PRAPARE - Administrator, Civil Service (Medical): No    Lack of Transportation (Non-Medical): No  Physical Activity: Insufficiently Active (08/17/2023)   Exercise Vital Sign    Days of Exercise per Week: 5 days    Minutes of Exercise per Session: 20 min  Stress: No Stress Concern Present (08/17/2023)   [employer] of Occupational Health - Occupational Stress Questionnaire    Feeling of Stress: Only a little  Social Connections: Moderately Integrated (08/17/2023)   Social Connection and Isolation Panel    Frequency of Communication with Friends and Family: Three times a week    Frequency of Social Gatherings with Friends and Family: Three times a week    Attends Religious Services: 1 to 4 times per year    Active Member of Clubs or Organizations: No    Attends Banker Meetings: Not on file    Marital Status: Married  Intimate Partner Violence: Unknown (10/06/2022)   Received from [facility]   HITS    Physically Hurt: Not on file    Insult or Talk Down To: Not on file    Threaten Physical Harm: Not on file    Scream or Curse: Not on file    PHYSICAL EXAM  GENERAL EXAM/CONSTITUTIONAL: Vitals:  Vitals:   10/02/23 1305  BP: (!) 149/81  Pulse: 89  Weight: 103 lb (46.7 kg)  Height: 5' (1.524 m)   Body mass index is 20.12 kg/m. Wt Readings from Last 3 Encounters:  10/02/23 103 lb (46.7 kg)  09/06/23 100 lb (45.4 kg)  08/21/23 105 lb (47.6 kg)   Patient is in no distress; well developed, nourished and groomed; neck is supple  MUSCULOSKELETAL: Gait, strength, tone, movements noted in Neurologic exam below  NEUROLOGIC: MENTAL STATUS:     01/18/2021    9:33 AM 09/30/2018    2:24 PM 01/28/2017    9:07 AM   MMSE - Mini Mental State Exam  Orientation to time 5 5 5    Orientation to Place 5 5 5    Registration 3 3 3    Attention/ Calculation 5 5 5    Recall 3 3 3    Language- name 2 objects 2 2 2    Language- repeat 1 1 1   Language- follow 3 step command 3 3 3    Language- read & follow direction 1 1 1    Write a sentence 1 1 1    Copy design 1 1 1    Total score 30 30 30       Data saved with a previous flowsheet row definition      10/02/2023    1:18 PM 03/20/2022   10:57 AM  Montreal Cognitive Assessment   Visuospatial/ Executive (0/5) 5 5  Naming (0/3) 3 3  Attention: Read list of digits (0/2) 2 2  Attention: Read list of letters (0/1) 1 1  Attention: Serial 7 subtraction starting at 100 (0/3) 3 3  Language: Repeat phrase (0/2) 2 2  Language : Fluency (0/1) 1 1  Abstraction (0/2) 2 2  Delayed Recall (0/5) 0 2  Orientation (0/6) 5 6  Total 24 27     CRANIAL NERVE:  2nd, 3rd, 4th, 6th - visual fields full to confrontation, extraocular muscles intact, no nystagmus 5th - facial sensation symmetric 7th - facial strength symmetric 8th - hearing intact 9th - palate elevates symmetrically, uvula midline 11th - shoulder shrug symmetric 12th - tongue protrusion midline  MOTOR:  normal bulk and tone, full strength in the BUE, BLE  SENSORY:  normal and symmetric to light touch  COORDINATION:  finger-nose-finger, fine finger movements normal  REFLEXES:  deep tendon reflexes present and symmetric  GAIT/STATION:  normal   DIAGNOSTIC DATA (LABS, IMAGING, TESTING) - I reviewed patient records, labs, notes, testing and imaging myself where available.  Lab Results  Component Value Date   WBC 8.5 03/06/2023   HGB 12.8 03/06/2023   HCT 38.0 03/06/2023   MCV 88.2 03/06/2023   PLT 183.0 03/06/2023      Component Value Date/Time   NA 141 03/06/2023 1307   K 4.2 03/06/2023 1307   CL 104 03/06/2023 1307   CO2 29 03/06/2023 1307   GLUCOSE 88 03/06/2023 1307   BUN 20 03/06/2023  1307   CREATININE 0.73 03/06/2023 1307   CREATININE 0.69 02/02/2022 1535   CALCIUM  9.5 03/06/2023 1307   CALCIUM  9.8 03/06/2023 1307   PROT 6.7 02/02/2022 1535   ALBUMIN 4.6 04/30/2018 0908   AST 19 02/02/2022 1535   ALT 15 02/02/2022 1535   ALKPHOS 43 04/30/2018 0908   BILITOT 0.4 02/02/2022 1535   Lab Results  Component Value Date   CHOL 154 03/06/2023   HDL 46.90 03/06/2023   LDLCALC 61 03/06/2023   LDLDIRECT 72 02/02/2022   TRIG 231.0 (H) 03/06/2023   CHOLHDL 3 03/06/2023   Lab Results  Component Value Date   HGBA1C 5.6 03/06/2023   Lab Results  Component Value Date   VITAMINB12 >1500 (H) 06/07/2022   Lab Results  Component Value Date   TSH 83.11 (H) 08/21/2023    MRI Brain/MRA Head and Neck 03/20/2021 No acute intracranial pathology, large vessel occlusion, or hemodynamically significant stenosis in the head or neck vessels.  ASSESSMENT AND PLAN  78 y.o. year old female with past medical history of hypothyroidism, hypertension, rotator cuff injury who is presenting for her cognitive impairment. She has a normal exam and she is independent in all Adls and IADLs.   Mild cognitive impairment vs Normal exam Mild cognitive impairment characterized by forgetfulness, particularly affecting short-term memory, with worsening symptoms over the past 18 months. Family history of dementia, with her mother developing dementia at a similar age.  Anxiety and Hashimoto's thyroiditis may impact cognitive function. Currently treated with Aricept  5 mg. Discussed limitations of dementia treatments. ATN profile not offered to patient as it might be a source of anxiety for patient if positive (Patient has MCI and not Dementia) Emphasized importance of comparing cognitive function with peers rather than younger self. - Continue Aricept  5 mg daily - Encourage cognitive stimulation activities such as computer games, word search, and reading - Avoid Alzheimer's biomarker testing due to  potential stress and lack of current dementia diagnosis  Cerebrovascular accident (stroke) Two small strokes may contribute to memory issues. Discussed impact of strokes on cognitive function and memory.  Generalized anxiety disorder Generalized anxiety disorder contributing to cognitive symptoms. Currently taking Lexapro  5 mg for anxiety. Discussed impact of anxiety on memory and cognitive function. Plan to increase  Lexapro  to 10 mg daily.  - Follow up with primary care physician to assess anxiety management   1. Mild cognitive impairment     Patient Instructions  Increase Lexapro  to 10 mg daily  Continue your other medications  Continue to follow up with PCP regarding management of thyroid  disease  Return as needed    There are well-accepted and sensible ways to reduce risk for Alzheimers disease and other degenerative brain disorders .  Exercise Daily Walk A daily 20 minute walk should be part of your routine. Disease related apathy can be a significant roadblock to exercise and the only way to overcome this is to make it a daily routine and perhaps have a reward at the end (something your loved one loves to eat or drink perhaps) or a personal trainer coming to the home can also be very useful. Most importantly, the patient is much more likely to exercise if the caregiver / spouse does it with him/her. In general a structured, repetitive schedule is best.  General Health: Any diseases which effect your body will effect your brain such as a pneumonia, urinary infection, blood clot, heart attack or stroke. Keep contact with your primary care doctor for regular follow ups.  Sleep. A good nights sleep is healthy for the brain. Seven hours is recommended. If you have insomnia or poor sleep habits we can give you some instructions. If you have sleep apnea wear your mask.  Diet: Eating a heart healthy diet is also a good idea; fish and poultry instead of red meat, nuts (mostly non-peanuts),  vegetables, fruits, olive oil or canola oil (instead of butter), minimal salt (use other spices to flavor foods), whole grain rice, bread, cereal and pasta and wine in moderation.Research is now showing that the MIND diet, which is a combination of The Mediterranean diet and the DASH diet, is beneficial for cognitive processing and longevity. Information about this diet can be found in The MIND Diet, a book by Annitta Feeling, MS, RDN, and online at [employer].es  Finances, Power of [address] and Advance Directives: You should consider putting legal safeguards in place with regard to financial and medical decision making. While the spouse always has power of attorney for medical and financial issues in the absence of any form, you should consider what you want in case the spouse / caregiver is no longer around or capable of making decisions.    No orders of the defined types were placed in this encounter.    Meds ordered this encounter  Medications   escitalopram  (LEXAPRO ) 10 MG tablet    Sig: Take 1 tablet (10 mg total) by mouth daily.    Dispense:  90 tablet    Refill:  0     Return if symptoms worsen or fail to improve.   I have spent a total of 40 minutes dedicated to this patient today, preparing to see patient, performing a medically appropriate examination and evaluation, ordering tests and/or medications and procedures, and counseling and educating the patient/family/caregiver; independently interpreting result and communicating results to the family/patient/caregiver; and documenting clinical information in the electronic medical record.    Pastor Falling, MD 10/02/2023, 5:45 PM  [facility] [address], [address] [place], [place] [address] [phone]

## 2023-10-02 NOTE — Patient Instructions (Addendum)
 Increase Lexapro  to 10 mg daily  Continue your other medications  Continue to follow up with PCP regarding management of thyroid  disease  Return as needed    There are well-accepted and sensible ways to reduce risk for Alzheimers disease and other degenerative brain disorders .  Exercise Daily Walk A daily 20 minute walk should be part of your routine. Disease related apathy can be a significant roadblock to exercise and the only way to overcome this is to make it a daily routine and perhaps have a reward at the end (something your loved one loves to eat or drink perhaps) or a personal trainer coming to the home can also be very useful. Most importantly, the patient is much more likely to exercise if the caregiver / spouse does it with him/her. In general a structured, repetitive schedule is best.  General Health: Any diseases which effect your body will effect your brain such as a pneumonia, urinary infection, blood clot, heart attack or stroke. Keep contact with your primary care doctor for regular follow ups.  Sleep. A good nights sleep is healthy for the brain. Seven hours is recommended. If you have insomnia or poor sleep habits we can give you some instructions. If you have sleep apnea wear your mask.  Diet: Eating a heart healthy diet is also a good idea; fish and poultry instead of red meat, nuts (mostly non-peanuts), vegetables, fruits, olive oil or canola oil (instead of butter), minimal salt (use other spices to flavor foods), whole grain rice, bread, cereal and pasta and wine in moderation.Research is now showing that the MIND diet, which is a combination of The Mediterranean diet and the DASH diet, is beneficial for cognitive processing and longevity. Information about this diet can be found in The MIND Diet, a book by Annitta Feeling, MS, RDN, and online at [employer]  Finances, Power of [address] and Advance Directives: You should consider putting legal  safeguards in place with regard to financial and medical decision making. While the spouse always has power of attorney for medical and financial issues in the absence of any form, you should consider what you want in case the spouse / caregiver is no longer around or capable of making decisions.

## 2023-10-14 ENCOUNTER — Ambulatory Visit (INDEPENDENT_AMBULATORY_CARE_PROVIDER_SITE_OTHER): Admitting: Family Medicine

## 2023-10-14 ENCOUNTER — Encounter: Payer: Self-pay | Admitting: Family Medicine

## 2023-10-14 VITALS — BP 130/80 | HR 74 | Temp 97.9°F | Wt 99.6 lb

## 2023-10-14 DIAGNOSIS — E559 Vitamin D deficiency, unspecified: Secondary | ICD-10-CM | POA: Diagnosis not present

## 2023-10-14 DIAGNOSIS — E782 Mixed hyperlipidemia: Secondary | ICD-10-CM

## 2023-10-14 DIAGNOSIS — I10 Essential (primary) hypertension: Secondary | ICD-10-CM | POA: Diagnosis not present

## 2023-10-14 DIAGNOSIS — I340 Nonrheumatic mitral (valve) insufficiency: Secondary | ICD-10-CM | POA: Diagnosis not present

## 2023-10-14 DIAGNOSIS — Z8673 Personal history of transient ischemic attack (TIA), and cerebral infarction without residual deficits: Secondary | ICD-10-CM | POA: Diagnosis not present

## 2023-10-14 DIAGNOSIS — N1832 Chronic kidney disease, stage 3b: Secondary | ICD-10-CM | POA: Diagnosis not present

## 2023-10-14 DIAGNOSIS — I6523 Occlusion and stenosis of bilateral carotid arteries: Secondary | ICD-10-CM | POA: Diagnosis not present

## 2023-10-14 DIAGNOSIS — M810 Age-related osteoporosis without current pathological fracture: Secondary | ICD-10-CM | POA: Diagnosis not present

## 2023-10-14 DIAGNOSIS — I679 Cerebrovascular disease, unspecified: Secondary | ICD-10-CM

## 2023-10-14 DIAGNOSIS — I350 Nonrheumatic aortic (valve) stenosis: Secondary | ICD-10-CM

## 2023-10-14 DIAGNOSIS — E063 Autoimmune thyroiditis: Secondary | ICD-10-CM

## 2023-10-14 DIAGNOSIS — F430 Acute stress reaction: Secondary | ICD-10-CM

## 2023-10-14 LAB — T4, FREE: Free T4: 0.96 ng/dL (ref 0.60–1.60)

## 2023-10-14 LAB — TSH: TSH: 3.99 u[IU]/mL (ref 0.35–5.50)

## 2023-10-14 LAB — T3, FREE: T3, Free: 3.7 pg/mL (ref 2.3–4.2)

## 2023-10-14 MED ORDER — DONEPEZIL HCL 10 MG PO TABS: 10.0000 mg | ORAL_TABLET | Freq: Every day | ORAL | 1 refills | Status: DC

## 2023-10-14 MED ORDER — ESCITALOPRAM OXALATE 10 MG PO TABS: 10.0000 mg | ORAL_TABLET | Freq: Every day | ORAL | 1 refills | Status: DC

## 2023-10-14 NOTE — Progress Notes (Signed)
 Cynthia Powell , 09-17-1945, 78 y.o., female MRN: [id number] Patient Care Team    Relationship Specialty Notifications Start End  Catherine Charlies LABOR, DO PCP - General Family Medicine  03/02/15   Elma Zachary RAMAN  Optometry  09/30/18   [facility]  Audiology  09/30/18   Marvis Ronal BIRCH., MD Referring Physician Gastroenterology  10/11/21   Beryl Donnice BRAVO, MD Referring Physician Endocrinology  03/06/23   Dolphus Reiter, MD Consulting Physician Rheumatology  03/06/23   Bernie Lamar PARAS, MD Consulting Physician Cardiology  03/06/23   Gregg Lek, MD Consulting Physician Neurology  03/06/23     Chief Complaint  Patient presents with   Hypothyroidism    Pt is not fasting.      Subjective: Cynthia Powell is a 78 y.o. female present for chronic condition management with her husband today.    Hypertension/hyperlipidemia-/CVA/TIA:Patient reports compliance with lisinopril  40 mg QD.  Patient denies chest pain, shortness of breath, dizziness or lower extremity edema.   Pt does  take daily baby ASA.  She is now taking Lipitor 10 mg daily. Diet: does not closely monitor.  Exercise: does not routinely exercise.  RF: HTN, HLD, FHX stroke (mother), TIA, CVA TEST/EVENTS :  Home sleep study Jul 23, 2023 negative for sleep apnea AHI 0 no nocturnal desaturations  Hypothyroid: Today patient reports she is compliant with Synthroid  50 mcg daily and Cytomel  5 mcg.  She was seen by neurology who feels she has  mild cognitive decline and she has had a sleep study that was negative for sleep apnea.  Prior note: Pt reports she misunderstood the directions and is taking the Cytomel  5 daily but stopped the Synthroid  50 mg daily.  She is having hypothyroid symptoms, she has been able to gain weight was a positive.  Thyroid /TSH levels have been normal since patient has been complianttaking the taking the correct dose prescribed.  She had concerns and asked for referral to endocrine which was  placed for her.  They reviewed her history and repeated her TSH which again was normal on the levothyroxine  50 mcg daily. Unfortunately patient stopped the levothyroxine  50 mcg daily.  When she started the Cytomel  5.  We elected to recollect the TSH/thyroid  labs today once we get the results we will consider further management with other Cytomel  on restarting the levothyroxine  depending upon the results.  Vit d deficiency/osteoporosis: Pt continues to take 5000 u vit d once a week. She declined fosamax start  MRI brain 03/06/2022: FINDINGS: MRI HEAD FINDINGS Brain: No acute infarction, hemorrhage, hydrocephalus, extra-axial collection or mass lesion. 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 Moderate patchy and confluence T2 hyperintensity in the deep cerebral, periventricular and subcortical white matter, consistent with chronic small-vessel disease. No foci of abnormal susceptibility. Bilateral hippocampal atrophy. No abnormal intracranial enhancement. Vascular: Normal flow voids. Skull and upper cervical spine: Normal marrow signal. Sinuses/Orbits: No acute or significant finding. Other: None. MRA HEAD FINDINGS Anterior circulation: No large vessel occlusion, aneurysm or hemodynamically significant stenosis. Posterior circulation: No large vessel occlusion, aneurysm or hemodynamically significant stenosis. Anatomic variants: Right posterior communicating artery is not visualized. MRA NECK FINDINGS Aortic arch: Three-vessel arch configuration. Arch vessel origins are patent. Right carotid system: No significant stenosis. Left carotid system: No significant stenosis. Vertebral arteries: No significant stenosis. Other: None IMPRESSION: No acute intracranial pathology, large vessel occlusion, or hemodynamically significant stenosis in the head or neck vessels.  05/30/2023    9:16 AM 09/12/2022    1:32 PM 05/16/2022    2:11 PM 02/02/2022    3:01 PM  10/11/2021    1:21 PM  Depression screen PHQ 2/9  Decreased Interest 0 0 0 0 0  Down, Depressed, Hopeless 0 0 0 0 0  PHQ - 2 Score 0 0 0 0 0  Altered sleeping 0 2     Tired, decreased energy 3 3     Change in appetite 0 1     Feeling bad or failure about yourself  0 0     Trouble concentrating 1 1     Moving slowly or fidgety/restless 1 0     Suicidal thoughts 0 0     PHQ-9 Score 5 7     Difficult doing work/chores Very difficult Somewhat difficult       Allergies  Allergen Reactions   Iodinated Contrast Media     Other reaction(s): UNCONSCIOUSNESS   Spinach    Corn-Containing Products Diarrhea   Fish Allergy Diarrhea    Pt c/o Bluefin tuna and trout causing diarrhea.   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   Hyperlipidemia    using Krill oil; refuses statin   Hypertension    Murmur, cardiac 02/02/2022   Osteoporosis    Polyarthralgia 04/05/2020   Sensory hearing loss, bilateral 2017   Bilateral hearing aids. [facility]   Snoring 09/27/2020   Spider  veins of both lower extremities 09/12/2015   Thyroid  disease 1980   Trigger finger    Wears hearing aid in both ears    Past Surgical History:  Procedure Laterality Date   BREAST SURGERY  1990   biopsy   CESAREAN SECTION     x2   COLONOSCOPY  05/16/2021   COLPOSCOPY     EYE SURGERY  cataracts removed 2015   TONSILLECTOMY  1954   TUBAL LIGATION  1973   Family History  Problem Relation Age of Onset   Arthritis Mother    Diabetes Mother    Stroke Mother        dementia after   Hypertension Mother    Fibromyalgia Mother    Varicose Veins Mother    Heart disease Father 61   Hypertension Father    Colon cancer Father        mets  liver   Pernicious anemia Father    Cancer Father    Breast cancer Sister        breast cancer   Hearing loss Maternal Grandfather    Melanoma Daughter        melanoma 2009   Diabetes Daughter    ADD / ADHD Daughter    Anxiety disorder Daughter    Learning disabilities Daughter    Lymphoma Daughter        Brain mets- on chemo ([facility])   OCD Daughter    Mental illness Daughter    Anxiety disorder Daughter    Asthma Daughter    Cancer Daughter    ADD / ADHD Daughter    Cancer Daughter    Diabetes Daughter    Learning disabilities Daughter    Allergies as of 10/14/2023       Reactions   Iodinated Contrast Media    Other reaction(s): UNCONSCIOUSNESS   Spinach    Corn-containing Products Diarrhea   Fish Allergy Diarrhea   Pt c/o Bluefin tuna and trout causing diarrhea.   Rice Diarrhea        Medication List        Accurate as of October 14, 2023  8:56 AM. If you have any questions, ask your nurse or doctor.          aspirin  EC 81 MG tablet Take 1 tablet (81 mg total) by mouth daily. Swallow whole.   atorvastatin  10 MG tablet Commonly known as: LIPITOR Take 1 tablet (10 mg total) by mouth daily.   cholecalciferol 1000 units tablet Commonly known as: VITAMIN D  Take 5,000 Units by mouth daily.   donepezil  10 MG  tablet Commonly known as: ARICEPT  Take 1 tablet (10 mg total) by mouth at bedtime. What changed:  medication strength how much to take Changed by: Charlies Bellini   escitalopram  10 MG tablet Commonly known as: Lexapro  Take 1 tablet (10 mg total) by mouth daily.   levothyroxine  50 MCG tablet Commonly known as: SYNTHROID  Take 1 tablet (50 mcg total) by mouth daily before breakfast.   liothyronine  5 MCG tablet Commonly known as: Cytomel  Take 1 tablet (5 mcg total) by mouth daily.   lisinopril  40 MG tablet Commonly known as: ZESTRIL  Take 1 tablet (40 mg total) by mouth daily.   VITAMIN B 12 PO Take 1 tablet by mouth daily.        All past medical history, surgical history, allergies, family history, immunizations andmedications were updated in the EMR today and reviewed under the history and medication portions of their EMR.     ROS: Negative, with the exception of above mentioned in HPI   Objective:  BP 130/80   Pulse 74   Temp 97.9 F (36.6 C)   Wt 99 lb 9.6 oz (45.2 kg)   SpO2 98%   BMI 19.45 kg/m  Body mass index is 19.45 kg/m. Physical Exam Vitals and nursing note reviewed.  Constitutional:      General: She is not in acute distress.    Appearance: Normal appearance. She is not ill-appearing, toxic-appearing or diaphoretic.  HENT:     Head: Normocephalic and atraumatic.  Eyes:     General: No scleral icterus.       Right eye: No discharge.        Left eye: No discharge.     Extraocular Movements: Extraocular movements intact.     Conjunctiva/sclera: Conjunctivae normal.     Pupils: Pupils are equal, round, and  reactive to light.  Cardiovascular:     Rate and Rhythm: Normal rate and regular rhythm.     Heart sounds: Murmur heard.  Pulmonary:     Effort: Pulmonary effort is normal. No respiratory distress.     Breath sounds: Normal breath sounds. No wheezing, rhonchi or rales.  Musculoskeletal:     Right lower leg: No edema.     Left lower leg: No  edema.  Skin:    General: Skin is warm.     Findings: No rash.  Neurological:     Mental Status: She is alert and oriented to person, place, and time. Mental status is at baseline.     Motor: No weakness.     Gait: Gait normal.  Psychiatric:        Mood and Affect: Mood normal.        Behavior: Behavior normal.        Thought Content: Thought content normal.        Judgment: Judgment normal.     No results found. No results found. No results found for this or any previous visit (from the past 24 hours).   Assessment/Plan: Cynthia Powell is a 78 y.o. female present for OV for chronic condition management Hypothyroidism, unspecified type/elevated glucose refills will be provided in appropriate dose based on lab result today Cytomel  5 mcg every day Levo 50 mcg  Stage 3b chronic kidney disease (HCC)/vitamin D  deficiency/osteoporosis Patient has been strongly encouraged to obtain adequate hydration. -We will attempt to avoid long-term NSAIDs if possible. -GFR stable at 53  Hypertension/hyperlipidemia/TIA/h/o CVA/cognitive decline/fh alz dementia: Stable Continue lisinopril  40 mg QD. Continue Lipitor 10 mg daily  Mild cognitive decline: increase Aricept  10 nightly  Anxious/frustrated Stable Continue lexapro  10 mg We again discussed this medication is being prescribed for anxiety, not depression  Reviewed expectations re: course of current medical issues. Discussed self-management of symptoms. Outlined signs and symptoms indicating need for more acute intervention. Patient verbalized understanding and all questions were answered. Patient received an After-Visit Summary.    Orders Placed This Encounter  Procedures   TSH   T4, free   T3, free    Meds ordered this encounter  Medications   donepezil  (ARICEPT ) 10 MG tablet    Sig: Take 1 tablet (10 mg total) by mouth at bedtime.    Dispense:  90 tablet    Refill:  1   escitalopram  (LEXAPRO ) 10 MG tablet    Sig:  Take 1 tablet (10 mg total) by mouth daily.    Dispense:  90 tablet    Refill:  1    Referral Orders  No referral(s) requested today     Note is dictated utilizing voice recognition software. Although note has been proof read prior to signing, occasional typographical errors still can be missed. If any questions arise, please do not hesitate to call for verification.   electronically signed by:  Charlies Bellini, DO  [facility] [place]

## 2023-10-14 NOTE — Patient Instructions (Signed)
 Return in about 24 weeks (around 03/30/2024) for Routine chronic condition follow-up.        Great to see you today.  I have refilled the medication(s) we provide.   If labs were collected or images ordered, we will inform you of  results once we have received them and reviewed. We will contact you either by echart message, or telephone call.  Please give ample time to the testing facility, and our office to run,  receive and review results. Please do not call inquiring of results, even if you can see them in your chart. We will contact you as soon as we are able. If it has been over 1 week since the test was completed, and you have not yet heard from us , then please call us .    - echart message- for normal results that have been seen by the patient already.   - telephone call: abnormal results or if patient has not viewed results in their echart.  If a referral to a specialist was entered for you, please call us  in 2 weeks if you have not heard from the specialist office to schedule.

## 2023-10-15 ENCOUNTER — Ambulatory Visit: Payer: Self-pay | Admitting: Family Medicine

## 2023-10-15 MED ORDER — LEVOTHYROXINE SODIUM 50 MCG PO TABS: 50.0000 ug | ORAL_TABLET | Freq: Every day | ORAL | 3 refills | Status: AC

## 2023-10-15 MED ORDER — LIOTHYRONINE SODIUM 5 MCG PO TABS: 5.0000 ug | ORAL_TABLET | Freq: Every day | ORAL | 3 refills | Status: DC

## 2023-10-30 DIAGNOSIS — H401131 Primary open-angle glaucoma, bilateral, mild stage: Secondary | ICD-10-CM | POA: Diagnosis not present

## 2023-10-30 DIAGNOSIS — H524 Presbyopia: Secondary | ICD-10-CM | POA: Diagnosis not present

## 2023-10-30 DIAGNOSIS — H35033 Hypertensive retinopathy, bilateral: Secondary | ICD-10-CM | POA: Diagnosis not present

## 2023-11-27 NOTE — Progress Notes (Signed)
 Office Visit Note  Patient: Cynthia Powell             Date of Birth: May 31, 1945           MRN: [id number]             PCP: Catherine Charlies LABOR, DO Referring: Catherine Charlies LABOR, DO Visit Date: 12/11/2023 Occupation: Data Unavailable  Subjective:  Fatigue  History of Present Illness: Cynthia Powell is a 78 y.o. female with osteoarthritis, osteoporosis and double-stranded DNA.  She returns today after her last visit in October 2024.  She states she continues to have fatigue.  She denies any history of oral ulcers, nasal ulcers, sicca symptoms, malar rash, photosensitivity or lymphadenopathy.  She complains of right hip pain which she points to the trochanteric region.  She denies any joint swelling.  She continues to have memory issues.  She states she was told that she has cognitive decline by the neurologist.  She continues to lose some weight.  She is still on gluten-free diet. She had menopause at age 22.  There is no history of a stress fracture or fracture.  Has no family history of osteoporosis or hip fracture in her mother.    Activities of Daily Living:  Patient reports morning stiffness for 0 minutes.   Patient Reports nocturnal pain.  Difficulty dressing/grooming: Denies Difficulty climbing stairs: Denies Difficulty getting out of chair: Denies Difficulty using hands for taps, buttons, cutlery, and/or writing: Denies  Review of Systems  Constitutional:  Positive for fatigue.  HENT:  Negative for mouth sores and mouth dryness.   Eyes:  Negative for dryness.  Respiratory:  Negative for shortness of breath.   Cardiovascular:  Negative for chest pain and palpitations.  Gastrointestinal:  Positive for diarrhea. Negative for blood in stool and constipation.  Endocrine: Negative for increased urination.  Genitourinary:  Negative for involuntary urination.  Musculoskeletal:  Positive for joint pain, gait problem, joint pain, myalgias, muscle weakness and myalgias. Negative for joint  swelling, morning stiffness and muscle tenderness.  Skin:  Positive for rash and hair loss. Negative for color change and sensitivity to sunlight.  Allergic/Immunologic: Negative for susceptible to infections.  Neurological:  Negative for dizziness and headaches.  Hematological:  Negative for swollen glands.  Psychiatric/Behavioral:  Positive for sleep disturbance. Negative for depressed mood. The patient is not nervous/anxious.     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 03/07/2022   Aortic stenosis: Only mild based on echocardiogram from 2023 02/27/2022   Mitral regurgitation only mild based on echocardiogram 2023 02/27/2022   Murmur, cardiac 02/02/2022   Amaurosis fugax 02/02/2022   Carotid artery calcification, bilateral- mild 05/01/2021   Enthesopathy of hip region 04/05/2020   B12 deficiency 04/05/2020   CKD (chronic kidney disease) stage 3, GFR 30-59 ml/min (HCC) 11/11/2018   Vitamin D  deficiency 10/29/2017   Sensory hearing loss, 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   Uses hearing aid 03/08/2015   Osteoporosis 03/08/2015   Hypothyroidism 03/02/2015   Essential hypertension 03/02/2015   Hyperlipidemia 03/02/2015   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/30/2010    Past Medical History:  Diagnosis Date   Abnormal Pap smear of vagina 1995,2007   pt had  h/o abnl pap; does not desire future screening/PAP   ADHD (attention deficit hyperactivity disorder), 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 Seems like a flare up started last year.     Herpes zoster 04/05/2020   A: given hx and physical findings as well as description of pain I do not think it is sciatica, pt also has vesicles on LE so may be that she is starting an outbreak P:--valtrex 1000 mg po tid x 7 d --fu if worsens or no better with meds   History of chickenpox    History of colon polyps 05/16/2021   History of shingles    above her right knee, gets frequently.   Hyperlipidemia    using Krill oil; refuses statin   Hypertension    Murmur, cardiac 02/02/2022   Osteoporosis    Polyarthralgia 04/05/2020   Sensory hearing loss, bilateral 2017   Bilateral hearing aids. [facility]   Snoring 09/27/2020   Spider veins of both lower extremities 09/12/2015   Thyroid  disease 1980   Trigger finger    Wears hearing aid in both ears     Family History  Problem Relation Age of Onset   Arthritis Mother    Diabetes Mother    Stroke Mother        dementia after   Hypertension Mother    Fibromyalgia Mother    Varicose Veins Mother    Heart disease Father 57   Hypertension Father    Colon cancer Father        mets liver   Pernicious anemia Father    Cancer Father    Breast cancer Sister        breast cancer   Hearing loss Maternal Grandfather    Melanoma Daughter        melanoma 2009   Diabetes Daughter    ADD / ADHD Daughter    Anxiety disorder Daughter    Learning disabilities Daughter    Lymphoma Daughter        Brain mets- on chemo ([facility])   OCD Daughter    Mental illness Daughter    Anxiety disorder Daughter    Asthma Daughter    Cancer Daughter    ADD / ADHD Daughter    Cancer Daughter    Diabetes Daughter    Learning disabilities Daughter    Past Surgical History:  Procedure Laterality Date   BREAST SURGERY  1990   biopsy   CESAREAN SECTION     x2    COLONOSCOPY  05/16/2021   COLPOSCOPY     EYE SURGERY  cataracts removed 2015   TONSILLECTOMY  1954   TUBAL LIGATION  1973   Social History   Tobacco Use   Smoking status: Never    Passive exposure: Past   Smokeless tobacco: Never   Tobacco comments:    Parents smoked a lot, I tried but quit when young  Vaping Use   Vaping status: Never Used  Substance Use Topics   Alcohol use: Not Currently    Comment: rarely glass of wine a year  but not for several years   Drug use: Never   Social History   Social History Narrative   Not on file     Immunization History  Administered Date(s) Administered   Fluad Quad(high Dose 65+) 12/01/2018, 12/22/2021   INFLUENZA, HIGH DOSE SEASONAL PF 11/30/2023   Influenza Split 12/26/2007, 10/21/2008, 11/15/2011   Influenza Whole 01/01/2002   Influenza, Quadrivalent, Recombinant, Inj, Pf 11/21/2017   Influenza-Unspecified 01/31/2004, 02/10/2005, 11/20/2013, 10/20/2014, 11/22/2016, 11/29/2019, 11/13/2020, 11/12/2022   MMR 09/23/2012, 10/22/2012   PFIZER Comirnaty(Gray Top)Covid-19 Tri-Sucrose Vaccine 12/29/2021   PFIZER(Purple Top)SARS-COV-2 Vaccination 03/21/2019, 04/18/2019, 12/25/2019, 07/04/2020, 11/09/2020   Pneumococcal Conjugate-13 12/31/2011   Pneumococcal Polysaccharide-23 11/05/2006, 01/28/2017   Respiratory Syncytial Virus Vaccine,Recomb Aduvanted(Arexvy) 10/24/2021   Td 01/01/2002   Td (Adult), 2 Lf Tetanus Toxid, Preservative Free 01/01/2002   Tdap 10/02/2018   Zoster Recombinant(Shingrix ) 10/02/2018, 12/10/2018   Zoster, Live 06/25/2013     Objective: Vital Signs: BP 107/63   Pulse 61   Temp 98 F (36.7 C)   Resp 13   Ht 5' (1.524 m)   Wt 95 lb 12.8 oz (43.5 kg)   BMI 18.71 kg/m    Physical Exam Vitals and nursing note reviewed.  Constitutional:      Appearance: She is well-developed.  HENT:     Head: Normocephalic and atraumatic.  Eyes:     Conjunctiva/sclera: Conjunctivae normal.  Cardiovascular:     Rate and  Rhythm: Normal rate and regular rhythm.     Heart sounds: Normal heart sounds.  Pulmonary:     Effort: Pulmonary effort is normal.     Breath sounds: Normal breath sounds.  Abdominal:     General: Bowel sounds are normal.     Palpations: Abdomen is soft.  Musculoskeletal:     Cervical back: Normal range of motion.  Lymphadenopathy:     Cervical: No cervical adenopathy.  Skin:    General: Skin is warm and dry.     Capillary Refill: Capillary refill takes less than 2 seconds.  Neurological:     Mental Status: She is alert and oriented to person, place, and time.  Psychiatric:        Behavior: Behavior normal.      Musculoskeletal Exam: Cervical, thoracic and lumbar spine were in good range of motion.  There was no SI joint tenderness.  Shoulder joints, elbow joints, wrist joints, MCPs, PIPs and DIPs were in good range of motion with no synovitis.  Hip joints and knee joints were in good range of motion without any warmth swelling or effusion.  There was no tenderness over ankles or MTPs.   CDAI Exam: CDAI Score: -- Patient Global: --; Provider Global: -- Swollen: --; Tender: -- Joint Exam 12/11/2023   No joint exam has been documented for this visit   There is currently no information documented on the homunculus. Go to the Rheumatology activity and complete the homunculus joint exam.  Investigation: No additional findings.  Imaging: No results found.  Recent Labs: Lab Results  Component Value Date   WBC 6.6 12/04/2023   HGB 12.5 12/04/2023   PLT 193 12/04/2023   NA 142 12/04/2023   K 4.3 12/04/2023   CL 105 12/04/2023   CO2 27 12/04/2023   GLUCOSE 85 12/04/2023   BUN 22 12/04/2023   CREATININE 0.84 12/04/2023   BILITOT 0.4 12/04/2023   ALKPHOS 43 04/30/2018   AST 18 12/04/2023   ALT 11 12/04/2023   PROT 6.6 12/04/2023   ALBUMIN 4.6 04/30/2018  CALCIUM  9.6 12/04/2023   January 2025 vitamin D64.47, PTH 27, TSH normal  Speciality Comments: No specialty  comments available.  Procedures:  No procedures performed Allergies: Iodinated contrast media, Spinach, Corn-containing products, Fish allergy, and Rice   Assessment / Plan:     Visit Diagnoses: Positive ANA (antinuclear antibody) - AVISE October 2023 ANA positive, titer negative, double-stranded DNA negative -patient had low titer double-stranded DNA in the past.  She denies any history of oral ulcers, nasal ulcers, malar rash, photosensitivity, Raynaud's, lymphadenopathy or inflammatory arthritis.  No synovitis was noted on the examination.  She continues to have fatigue.  I will check labs today.  Plan: Protein / creatinine ratio, urine, CBC with Differential/Platelet, Comprehensive metabolic panel with GFR, ANA, Anti-DNA antibody, double-stranded, C3 and C4, Sedimentation rate  Other fatigue-she has ongoing fatigue.  Primary osteoarthritis of both hands-she is mild PIP and DIP thickening with no discomfort.  Trochanteric bursitis of right hip-she has been having some discomfort over the right trochanteric region.  A handout on IT band stretches was given.  Patient declined physical therapy.  Age-related osteoporosis without current pathological fracture - DEXA 03/22/2017: T score -2.8.  DEXA updated on 01/31/2021: Left femoral neck T score -2.8.  We discussed treatment for osteoporosis at the last visit in this visit as well.  She is not interested in starting treatment for osteoporosis.  I advised her to get DEXA scan with her PCP on the same machine.  I will obtain additional labs today.  Increased risk of fracture, morbidity from osteoporosis were discussed at length with the patient and her husband.- Plan: Parathyroid hormone, intact (no Ca), Phosphorus, Serum protein electrophoresis with reflex.  I offered physical therapy for osteoporosis and fall prevention but she declined.  Vitamin D  deficiency - Plan: VITAMIN D  25 Hydroxy (Vit-D Deficiency, Fractures)  Other medical problems are listed  as follows:  Amaurosis fugax  Essential hypertension-blood pressure was normal today at 107/63.  History of glaucoma  History of hyperlipidemia  Spider veins of both lower extremities  B12 deficiency  History of hypothyroidism  Sensory hearing loss, bilateral  Lichenification and lichen simplex chronicus  Stress  Weight loss-patient reports unintentional weight loss over the last year.  She has lost 7 pounds since her last visit.  She has been on gluten-free diet but there is no diagnosis of gluten sensitivity or celiac disease.  She is an appointment with the allergist.  She has also lost muscle mass.  She may benefit from seeing a nutritionist.  Orders: Orders Placed This Encounter  Procedures   Protein / creatinine ratio, urine   CBC with Differential/Platelet   Comprehensive metabolic panel with GFR   ANA   Anti-DNA antibody, double-stranded   C3 and C4   Sedimentation rate   VITAMIN D  25 Hydroxy (Vit-D Deficiency, Fractures)   Parathyroid hormone, intact (no Ca)   Phosphorus   Serum protein electrophoresis with reflex   No orders of the defined types were placed in this encounter.    Follow-Up Instructions: Return in about 1 year (around 12/10/2024) for Osteoporosis, Osteoarthritis.   Maya Nash, MD  Note - This record has been created using Animal nutritionist.  Chart creation errors have been sought, but may not always  have been located. Such creation errors do not reflect on  the standard of medical care.

## 2023-11-30 DIAGNOSIS — Z23 Encounter for immunization: Secondary | ICD-10-CM | POA: Diagnosis not present

## 2023-12-04 ENCOUNTER — Encounter: Payer: Self-pay | Admitting: Family Medicine

## 2023-12-04 ENCOUNTER — Ambulatory Visit (INDEPENDENT_AMBULATORY_CARE_PROVIDER_SITE_OTHER): Admitting: Family Medicine

## 2023-12-04 VITALS — BP 108/72 | HR 68 | Temp 97.8°F | Wt 95.4 lb

## 2023-12-04 DIAGNOSIS — R197 Diarrhea, unspecified: Secondary | ICD-10-CM

## 2023-12-04 DIAGNOSIS — R5383 Other fatigue: Secondary | ICD-10-CM | POA: Diagnosis not present

## 2023-12-04 DIAGNOSIS — Z91018 Allergy to other foods: Secondary | ICD-10-CM

## 2023-12-04 DIAGNOSIS — E41 Nutritional marasmus: Secondary | ICD-10-CM | POA: Diagnosis not present

## 2023-12-04 NOTE — Progress Notes (Signed)
 Cynthia Powell , 1945/12/21, 78 y.o., female MRN: [id number] Patient Care Team    Relationship Specialty Notifications Start End  Catherine Charlies LABOR, DO PCP - General Family Medicine  03/02/15   Elma Zachary RAMAN  Optometry  09/30/18   [facility]  Audiology  09/30/18   Marvis Ronal BIRCH., MD Referring Physician Gastroenterology  10/11/21   Beryl Donnice BRAVO, MD Referring Physician Endocrinology  03/06/23   Dolphus Reiter, MD Consulting Physician Rheumatology  03/06/23   Bernie Lamar PARAS, MD Consulting Physician Cardiology  03/06/23   Gregg Lek, MD Consulting Physician Neurology  03/06/23     Chief Complaint  Patient presents with   Diarrhea    On and off for 1 month; also mentions increased fatigue.      Subjective: Cynthia Powell is a 78 y.o. Pt presents for an OV with complaints of diarrhea of 3-4 weeks duration.  Associated symptoms include fatigue. Patient has a significant medical history of thyroid  disorder and has had chronic fatigue. Patient presents today with her husband who also adds to the history.  Patient had 3 weeks of pretty bad diarrhea, she describes it as watery and multiple times a day.  Her husband states that she would be up in the middle of the night for few hours with diarrhea. Patient reports the diarrhea has slowed down for about a week but then would intermittently return.  She states her bowel movements have not been back to normal or formed for over 4 weeks.  She reports she does not understand why her bowels changed because she did not change any of her diet.  She reports they were black and tarry, but she was taking Kaopectate at that time.  States since she stopped the Kaopectate they are black and tarry, but they are still darker brown than her usual. Patient reports she eats a dairy free, rice free and typically corn product free diet due to prior allergy testing. Of note, her thyroid  tests were finally in normal range 6 weeks ago,  but coincidentally that is when she started more routinely taking the Cytomel  and diarrhea started. She is also on Aricept . There has been a 7 pound weight loss in 3 months. They would also like referral to allergist for allergy testing.  Pt denies exposure  to insects, recent travel, under prepared foods, antibiotics  or sick contacts.       05/30/2023    9:16 AM 09/12/2022    1:32 PM 05/16/2022    2:11 PM 02/02/2022    3:01 PM 10/11/2021    1:21 PM  Depression screen PHQ 2/9  Decreased Interest 0 0 0 0 0  Down, Depressed, Hopeless 0 0 0 0 0  PHQ - 2 Score 0 0 0 0 0  Altered sleeping 0 2     Tired, decreased energy 3 3     Change in appetite 0 1     Feeling bad or failure about yourself  0 0     Trouble concentrating 1 1     Moving slowly or fidgety/restless 1 0     Suicidal thoughts 0 0     PHQ-9 Score 5 7     Difficult doing work/chores Very difficult Somewhat difficult       Allergies  Allergen Reactions   Iodinated Contrast Media     Other reaction(s): UNCONSCIOUSNESS   Spinach    Corn-Containing Products Diarrhea   Fish  Allergy Diarrhea    Pt c/o Bluefin tuna and trout causing diarrhea.   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 Seems like a flare up started last year.     Herpes zoster 04/05/2020   A: given hx and physical findings as well as description of pain I do not  think it is sciatica, pt also has vesicles on LE so may be that she is starting an outbreak P:--valtrex 1000 mg po tid x 7 d --fu if worsens or no better with meds   History of chickenpox    History of colon polyps 05/16/2021   History of shingles    above her right knee, gets frequently.   Hyperlipidemia    using Krill oil; refuses statin   Hypertension    Murmur, cardiac 02/02/2022   Osteoporosis    Polyarthralgia 04/05/2020   Sensory hearing loss, bilateral 2017   Bilateral hearing aids. [facility]   Snoring 09/27/2020   Spider veins of both lower extremities 09/12/2015   Thyroid  disease 1980   Trigger finger    Wears hearing aid in both ears    Past Surgical History:  Procedure Laterality Date   BREAST SURGERY  1990   biopsy   CESAREAN SECTION     x2   COLONOSCOPY  05/16/2021   COLPOSCOPY     EYE SURGERY  cataracts removed 2015   TONSILLECTOMY  1954   TUBAL LIGATION  1973   Family History  Problem Relation Age of Onset   Arthritis Mother    Diabetes Mother    Stroke Mother        dementia after   Hypertension Mother    Fibromyalgia Mother    Varicose Veins Mother    Heart disease Father 44   Hypertension Father    Colon cancer Father        mets liver   Pernicious anemia Father    Cancer Father    Breast cancer Sister        breast cancer   Hearing loss Maternal Grandfather    Melanoma Daughter        melanoma 2009   Diabetes Daughter    ADD / ADHD Daughter    Anxiety disorder Daughter    Learning disabilities Daughter    Lymphoma Daughter        Brain mets- on chemo ([facility])   OCD Daughter    Mental illness Daughter    Anxiety disorder Daughter    Asthma Daughter    Cancer Daughter    ADD / ADHD Daughter    Cancer Daughter    Diabetes Daughter    Learning disabilities Daughter    Allergies as of 12/04/2023       Reactions   Iodinated Contrast Media  Other reaction(s): UNCONSCIOUSNESS   Spinach    Corn-containing Products Diarrhea   Fish  Allergy Diarrhea   Pt c/o Bluefin tuna and trout causing diarrhea.   Rice Diarrhea        Medication List        Accurate as of December 04, 2023  2:13 PM. If you have any questions, ask your nurse or doctor.          aspirin  EC 81 MG tablet Take 1 tablet (81 mg total) by mouth daily. Swallow whole.   atorvastatin  10 MG tablet Commonly known as: LIPITOR Take 1 tablet (10 mg total) by mouth daily.   cholecalciferol 1000 units tablet Commonly known as: VITAMIN D  Take 5,000 Units by mouth daily.   donepezil  10 MG tablet Commonly known as: ARICEPT  Take 1 tablet (10 mg total) by mouth at bedtime.   escitalopram  10 MG tablet Commonly known as: Lexapro  Take 1 tablet (10 mg total) by mouth daily.   levothyroxine  50 MCG tablet Commonly known as: SYNTHROID  Take 1 tablet (50 mcg total) by mouth daily before breakfast.   liothyronine  5 MCG tablet Commonly known as: Cytomel  Take 1 tablet (5 mcg total) by mouth daily.   lisinopril  40 MG tablet Commonly known as: ZESTRIL  Take 1 tablet (40 mg total) by mouth daily.   VITAMIN B 12 PO Take 1 tablet by mouth daily.        All past medical history, surgical history, allergies, family history, immunizations andmedications were updated in the EMR today and reviewed under the history and medication portions of their EMR.     ROS Negative, with the exception of above mentioned in HPI   Objective:  BP 108/72   Pulse 68   Temp 97.8 F (36.6 C)   Wt 95 lb 6.4 oz (43.3 kg)   SpO2 98%   BMI 18.63 kg/m  Body mass index is 18.63 kg/m. Physical Exam Vitals and nursing note reviewed.  Constitutional:      General: She is not in acute distress.    Appearance: Normal appearance. She is not ill-appearing, toxic-appearing or diaphoretic.  HENT:     Head: Normocephalic and atraumatic.  Eyes:     General: No scleral icterus.       Right eye: No discharge.        Left eye: No discharge.     Extraocular Movements: Extraocular  movements intact.     Conjunctiva/sclera: Conjunctivae normal.     Pupils: Pupils are equal, round, and reactive to light.  Cardiovascular:     Rate and Rhythm: Normal rate and regular rhythm.  Pulmonary:     Effort: Pulmonary effort is normal. No respiratory distress.     Breath sounds: Normal breath sounds. No wheezing, rhonchi or rales.  Abdominal:     General: Abdomen is flat and scaphoid. Bowel sounds are increased. There is no distension.     Palpations: Abdomen is soft. There is no mass.     Tenderness: There is no abdominal tenderness. There is no guarding or rebound.     Hernia: No hernia is present.  Musculoskeletal:     Right lower leg: No edema.     Left lower leg: No edema.  Skin:    General: Skin is warm.     Findings: No rash.  Neurological:     Mental Status: She is alert and oriented to person, place, and time. Mental status is at baseline.     Motor: No weakness.  Gait: Gait normal.  Psychiatric:        Mood and Affect: Mood normal.        Behavior: Behavior normal.        Thought Content: Thought content normal.        Judgment: Judgment normal.     No results found. No results found. No results found for this or any previous visit (from the past 24 hours).  Assessment/Plan: PORCHE STEINBERGER is a 78 y.o. female present for OV for  Diarrhea, unspecified type (Primary)/fatigue We discussed multiple etiologies that could cause bowel habit changes.  It is concerning she has had 7 pound weight loss in 3 months.  However, this is when her thyroid  became in normal range compared to hypothyroidism. Diarrhea may be secondary to Cytomel  or the Aricept . Colonoscopy is up-to-date 2 years ago.  Which is reassuring - CBC w/Diff - Comp Met (CMET) - TSH - Vitamin D  (25 hydroxy) - C-reactive protein - C Difficile Quick Screen w PCR reflex; Future - Gastrointestinal Panel by PCR , Stool; Future - Fecal leukocytes; Future - Pancreatic Elastase, Fecal; Future -  Sedimentation rate We will call patient with results and further plan discussed at that time.  Nutritional marasmus - Vitamin D  (25 hydroxy) Food allergy - Ambulatory referral to Allergy  Reviewed expectations re: course of current medical issues. Discussed self-management of symptoms. Outlined signs and symptoms indicating need for more acute intervention. Patient verbalized understanding and all questions were answered. Patient received an After-Visit Summary.    Orders Placed This Encounter  Procedures   C Difficile Quick Screen w PCR reflex   Gastrointestinal Panel by PCR , Stool   Fecal leukocytes   CBC w/Diff   Comp Met (CMET)   TSH   Vitamin D  (25 hydroxy)   C-reactive protein   Pancreatic Elastase, Fecal   Sedimentation rate   No orders of the defined types were placed in this encounter.  Referral Orders  No referral(s) requested today     Note is dictated utilizing voice recognition software. Although note has been proof read prior to signing, occasional typographical errors still can be missed. If any questions arise, please do not hesitate to call for verification.   electronically signed by:  Charlies Bellini, DO  [facility] [place]

## 2023-12-05 ENCOUNTER — Ambulatory Visit: Payer: Self-pay | Admitting: Family Medicine

## 2023-12-05 LAB — CBC WITH DIFFERENTIAL/PLATELET
Absolute Lymphocytes: 2218 {cells}/uL (ref 850–3900)
Absolute Monocytes: 574 {cells}/uL (ref 200–950)
Basophils Absolute: 40 {cells}/uL (ref 0–200)
Basophils Relative: 0.6 %
Eosinophils Absolute: 172 {cells}/uL (ref 15–500)
Eosinophils Relative: 2.6 %
HCT: 38.6 % (ref 35.0–45.0)
Hemoglobin: 12.5 g/dL (ref 11.7–15.5)
MCH: 29.8 pg (ref 27.0–33.0)
MCHC: 32.4 g/dL (ref 32.0–36.0)
MCV: 91.9 fL (ref 80.0–100.0)
MPV: 10.7 fL (ref 7.5–12.5)
Monocytes Relative: 8.7 %
Neutro Abs: 3597 {cells}/uL (ref 1500–7800)
Neutrophils Relative %: 54.5 %
Platelets: 193 Thousand/uL (ref 140–400)
RBC: 4.2 Million/uL (ref 3.80–5.10)
RDW: 11.4 % (ref 11.0–15.0)
Total Lymphocyte: 33.6 %
WBC: 6.6 Thousand/uL (ref 3.8–10.8)

## 2023-12-05 LAB — VITAMIN D 25 HYDROXY (VIT D DEFICIENCY, FRACTURES): Vit D, 25-Hydroxy: 66 ng/mL (ref 30–100)

## 2023-12-05 LAB — COMPREHENSIVE METABOLIC PANEL WITH GFR
AG Ratio: 1.8 (calc) (ref 1.0–2.5)
ALT: 11 U/L (ref 6–29)
AST: 18 U/L (ref 10–35)
Albumin: 4.2 g/dL (ref 3.6–5.1)
Alkaline phosphatase (APISO): 46 U/L (ref 37–153)
BUN: 22 mg/dL (ref 7–25)
CO2: 27 mmol/L (ref 20–32)
Calcium: 9.6 mg/dL (ref 8.6–10.4)
Chloride: 105 mmol/L (ref 98–110)
Creat: 0.84 mg/dL (ref 0.60–0.95)
Globulin: 2.4 g/dL (ref 1.9–3.7)
Glucose, Bld: 85 mg/dL (ref 65–99)
Potassium: 4.3 mmol/L (ref 3.5–5.3)
Sodium: 142 mmol/L (ref 135–146)
Total Bilirubin: 0.4 mg/dL (ref 0.2–1.2)
Total Protein: 6.6 g/dL (ref 6.1–8.1)
eGFR: 70 mL/min/1.73m2 (ref >=60)

## 2023-12-05 LAB — C-REACTIVE PROTEIN: CRP: <3 mg/L (ref <8.0)

## 2023-12-05 LAB — SEDIMENTATION RATE: Sed Rate: 9 mm/h (ref 0–30)

## 2023-12-05 LAB — TSH: TSH: 0.12 m[IU]/L — ABNORMAL LOW (ref 0.40–4.50)

## 2023-12-05 MED ORDER — LIOTHYRONINE SODIUM 5 MCG PO TABS: 2.5000 ug | ORAL_TABLET | Freq: Every day | ORAL | 3 refills | Status: DC

## 2023-12-06 ENCOUNTER — Other Ambulatory Visit (HOSPITAL_COMMUNITY)
Admission: RE | Admit: 2023-12-06 | Discharge: 2023-12-06 | Disposition: A | Discharge status: Home / Self Care | Attending: Family Medicine | Admitting: Family Medicine

## 2023-12-06 ENCOUNTER — Other Ambulatory Visit

## 2023-12-06 DIAGNOSIS — R197 Diarrhea, unspecified: Secondary | ICD-10-CM

## 2023-12-06 DIAGNOSIS — R5383 Other fatigue: Secondary | ICD-10-CM | POA: Insufficient documentation

## 2023-12-06 LAB — C DIFFICILE QUICK SCREEN W PCR REFLEX
C Diff antigen: NEGATIVE
C Diff interpretation: NOT DETECTED
C Diff toxin: NEGATIVE

## 2023-12-07 LAB — GASTROINTESTINAL PANEL BY PCR, STOOL (REPLACES STOOL CULTURE)

## 2023-12-11 ENCOUNTER — Ambulatory Visit: Payer: Medicare Other | Attending: Rheumatology | Admitting: Rheumatology

## 2023-12-11 ENCOUNTER — Encounter: Payer: Self-pay | Admitting: Rheumatology

## 2023-12-11 VITALS — BP 107/63 | HR 61 | Temp 98.0°F | Resp 13 | Ht 60.0 in | Wt 95.8 lb

## 2023-12-11 DIAGNOSIS — R5383 Other fatigue: Secondary | ICD-10-CM | POA: Diagnosis not present

## 2023-12-11 DIAGNOSIS — E538 Deficiency of other specified B group vitamins: Secondary | ICD-10-CM | POA: Diagnosis present

## 2023-12-11 DIAGNOSIS — E559 Vitamin D deficiency, unspecified: Secondary | ICD-10-CM | POA: Insufficient documentation

## 2023-12-11 DIAGNOSIS — R634 Abnormal weight loss: Secondary | ICD-10-CM | POA: Diagnosis not present

## 2023-12-11 DIAGNOSIS — G453 Amaurosis fugax: Secondary | ICD-10-CM | POA: Insufficient documentation

## 2023-12-11 DIAGNOSIS — Z8639 Personal history of other endocrine, nutritional and metabolic disease: Secondary | ICD-10-CM | POA: Insufficient documentation

## 2023-12-11 DIAGNOSIS — H903 Sensorineural hearing loss, bilateral: Secondary | ICD-10-CM | POA: Diagnosis present

## 2023-12-11 DIAGNOSIS — Z8669 Personal history of other diseases of the nervous system and sense organs: Secondary | ICD-10-CM | POA: Diagnosis not present

## 2023-12-11 DIAGNOSIS — F439 Reaction to severe stress, unspecified: Secondary | ICD-10-CM | POA: Diagnosis present

## 2023-12-11 DIAGNOSIS — I10 Essential (primary) hypertension: Secondary | ICD-10-CM | POA: Diagnosis not present

## 2023-12-11 DIAGNOSIS — I8393 Asymptomatic varicose veins of bilateral lower extremities: Secondary | ICD-10-CM | POA: Diagnosis present

## 2023-12-11 DIAGNOSIS — M19041 Primary osteoarthritis, right hand: Secondary | ICD-10-CM | POA: Diagnosis not present

## 2023-12-11 DIAGNOSIS — L280 Lichen simplex chronicus: Secondary | ICD-10-CM | POA: Insufficient documentation

## 2023-12-11 DIAGNOSIS — M19042 Primary osteoarthritis, left hand: Secondary | ICD-10-CM | POA: Diagnosis present

## 2023-12-11 DIAGNOSIS — M7061 Trochanteric bursitis, right hip: Secondary | ICD-10-CM | POA: Diagnosis not present

## 2023-12-11 DIAGNOSIS — M810 Age-related osteoporosis without current pathological fracture: Secondary | ICD-10-CM | POA: Diagnosis not present

## 2023-12-11 DIAGNOSIS — R7689 Other specified abnormal immunological findings in serum: Secondary | ICD-10-CM | POA: Diagnosis not present

## 2023-12-11 NOTE — Patient Instructions (Addendum)
 Please discuss to repeat DEXA scan through Dr. Catherine   Iliotibial Band Syndrome Rehab Ask your health care provider which exercises are safe for you. Do exercises exactly as told by your provider and adjust them as told. It's normal to feel mild stretching, pulling, tightness, or discomfort as you do these exercises. Stop right away if you feel sudden pain or your pain gets a lot worse. Do not begin these exercises until told by your provider. Stretching and range-of-motion exercises These exercises warm up your muscles and joints. They also improve the movement and flexibility of your hip and pelvis. Quadriceps stretch, prone  Lie face down (prone) on a firm surface like a bed or padded floor. Bend your left / right knee. Reach back to hold your ankle or pant leg. If you can't reach your ankle or pant leg, use a belt looped around your foot and grab the belt instead. Gently pull your heel toward your butt. Your knee should not slide out to the side. You should feel a stretch in the front of your thigh and knee, also called the quadriceps. Hold this position for __________ seconds. Repeat __________ times. Complete this exercise __________ times a day. Iliotibial band stretch The iliotibial band is a strip of tissue that runs along the outside of your hip down to your knee. Lie on your side with your left / right leg on top. Bend both knees and grab your left / right ankle. Stretch out your bottom arm to help you balance. Slowly bring your top knee back so your thigh goes behind your back. Slowly lower your top leg toward the floor until you feel a gentle stretch on the outside of your left / right hip and thigh. If you don't feel a stretch and your knee won't go farther, place the heel of your other foot on top of your knee and pull your knee down toward the floor with your foot. Hold this position for __________ seconds. Repeat __________ times. Complete this exercise __________ times a  day. Strengthening exercises These exercises build strength and endurance in your hip and pelvis. Endurance means your muscles can keep working even when they're tired. Straight leg raises, side-lying This exercise strengthens the muscles that rotate the leg at the hip and move it away from your body. These muscles are called hip abductors. Lie on your side with your left / right leg on top. Lie so your head, shoulder, hip, and knee line up. You can bend your bottom knee to help you balance. Roll your hips slightly forward so they're stacked directly over each other. Your left / right knee should face forward. Tense the muscles in your outer thigh and hip. Lift your top leg 4-6 inches (10-15 cm) off the ground. Hold this position for __________ seconds. Slowly lower your leg back down to the starting position. Let your muscles fully relax before doing this exercise again. Repeat __________ times. Complete this exercise __________ times a day. Leg raises, prone This exercise strengthens the muscles that move the hips backward. These muscles are called hip extensors. Lie face down (prone) on your bed or a firm surface. You can put a pillow under your hips for comfort and to support your lower back. Bend your left / right knee so your foot points straight up toward the ceiling. Keep the other leg straight and behind you. Squeeze your butt muscles. Lift your left / right thigh off the firm surface. Do not let your back arch.  Tense your thigh muscle as hard as you can without having more knee pain. Hold this position for __________ seconds. Slowly lower your leg to the starting position. Allow your leg to relax all the way. Repeat __________ times. Complete this exercise __________ times a day. Hip hike  Stand sideways on a bottom step. Place your feet so that your left / right leg is on the step, and the other foot is hanging off the side. If you need support for balance, hold onto a railing or  wall. Keep your knees straight and your abdomen square, meaning your hips are level. Then, lift your left / right hip up toward the ceiling. Slowly let your leg that's hanging off the step lower towards the floor. Your foot should get closer to the ground. Do not lean or bend your knees during this movement. Repeat __________ times. Complete this exercise __________ times a day. This information is not intended to replace advice given to you by your health care provider. Make sure you discuss any questions you have with your health care provider. Document Revised: 04/27/2022 Document Reviewed: 04/27/2022 Elsevier Patient Education  2024 [employer]  Eating Plan for Osteoporosis Osteoporosis causes your bones to become weak and brittle. This puts you at greater risk for bone breaks (fractures) from small bumps or falls. Making changes to your diet and increasing your physical activity can help strengthen your bones and improve your overall health. Calcium  and vitamin D  are nutrients that play an important role in bone health. Vitamin D  helps your body use calcium  and strengthen bones. It is important to get enough calcium  and vitamin D  as part of your eating plan for osteoporosis. What are tips for following this plan? Reading food labels Try to get at least 1,000 milligrams (mg) of calcium  each day. Look for foods that have at least 50 mg of calcium  per serving. Talk with your health care provider about taking a calcium  supplement if you do not get enough calcium  from food. Do not have more than 2,500 mg of calcium  each day. This is the upper limit for food and nutritional supplements combined. Too much calcium  may cause constipation and prevent you from absorbing other important nutrients. Choose foods that contain vitamin D . Take a daily vitamin supplement that contains 800-1,000 international units (IU) of vitamin D . The amount may be different depending on your age, body weight, and where  you live. Talk with your dietitian or health care provider about how much vitamin D  is right for you. Avoid foods that have more than 300 mg of sodium per serving. Too much sodium can cause your body to lose calcium . Talk with your dietitian or health care provider about how much sodium you are allowed each day. Shopping Do not buy foods with added salt, including: Salted snacks. Dene. Canned soups. Canned meats. Processed meats, such as bacon or precooked or cured meat like sausages or meat loaves. Smoked fish. Meal planning Eat balanced meals that contain protein foods, fruits and vegetables, and foods rich in calcium  and vitamin D . Eat at least 5 servings of fruits and vegetables each day. Eat 5-6 oz (142-170 g) of lean meat, poultry, fish, eggs, or beans each day. Lifestyle Do not use any products that contain nicotine or tobacco, such as cigarettes, e-cigarettes, and chewing tobacco. If you need help quitting, ask your health care provider. If your health care provider recommends that you lose weight: Work with a dietitian to develop an eating plan that will  help you reach your desired weight goal. Exercise for at least 30 minutes a day, 5 or more days a week, or as told by your health care provider. Work with a physical therapist to develop an exercise plan that includes flexibility, balance, and strength exercises. Do not focus only on aerobic exercise. Do not drink alcohol if: Your health care provider tells you not to drink. You are pregnant, may be pregnant, or are planning to become pregnant. If you drink alcohol: Limit how much you use to: 0-1 drink a day for women. 0-2 drinks a day for men. Be aware of how much alcohol is in your drink. In the [place] one drink equals one 12 oz bottle of beer (355 mL), one 5 oz glass of wine (148 mL), or one 1 oz glass of hard liquor (44 mL). What foods should I eat? Foods high in calcium   Yogurt. Yogurt with fruit. Milk. Evaporated  skim milk. Dry milk powder. Calcium -fortified orange juice. Parmesan cheese. Part-skim ricotta cheese. Natural hard cheese. Cream cheese. Cottage cheese. Canned sardines. Canned salmon. Calcium -treated tofu. Calcium -fortified cereal bar. Calcium -fortified cereal. Calcium -fortified graham crackers. Cooked collard greens. Turnip greens. Broccoli. Kale. Almonds. White beans. Corn tortilla. Foods high in vitamin D  Cod liver oil. Fatty fish, such as tuna, mackerel, and salmon. Milk. Fortified soy milk. Fortified fruit juice. Yogurt. Margarine. Egg yolks. Foods high in protein Beef. Lamb. Pork tenderloin. Chicken breast. Tuna (canned). Fish fillet. Tofu. Cooked soy beans. Soy patty. Beans (canned or cooked). Cottage cheese. Yogurt. Peanut butter. Pumpkin seeds. Nuts. Sunflower seeds. Hard cheese. Milk or other milk products, such as soy milk. The items listed above may not be a complete list of foods and beverages you can eat. Contact a dietitian for more options. Summary Calcium  and vitamin D  are nutrients that play an important role in bone health and are an important part of your eating plan for osteoporosis. Eat balanced meals that contain protein foods, fruits and vegetables, and foods rich in calcium  and vitamin D . Avoid foods that have more than 300 mg of sodium per serving. Too much sodium can cause your body to lose calcium . Exercise is an important part of prevention and treatment of osteoporosis. Aim for at least 30 minutes a day, 5 days a week. This information is not intended to replace advice given to you by your health care provider. Make sure you discuss any questions you have with your health care provider. Document Revised: 07/30/2019 Document Reviewed: 07/30/2019 Elsevier Patient Education  2024 [employer].

## 2023-12-12 ENCOUNTER — Other Ambulatory Visit: Payer: Self-pay

## 2023-12-12 ENCOUNTER — Encounter: Payer: Self-pay | Admitting: Allergy

## 2023-12-12 ENCOUNTER — Telehealth: Payer: Self-pay

## 2023-12-12 ENCOUNTER — Ambulatory Visit: Admitting: Allergy

## 2023-12-12 VITALS — BP 106/70 | HR 63 | Temp 98.0°F | Resp 20 | Ht 60.0 in | Wt 97.1 lb

## 2023-12-12 DIAGNOSIS — Z713 Dietary counseling and surveillance: Secondary | ICD-10-CM | POA: Diagnosis not present

## 2023-12-12 DIAGNOSIS — L853 Xerosis cutis: Secondary | ICD-10-CM | POA: Diagnosis not present

## 2023-12-12 NOTE — Telephone Encounter (Signed)
 Patient would like to have bone density scan ordered. Last one 2019 not sure if she is due or not. Patient seen recently by Dr. Catherine (10/8) and next appt is 11/25.  Patient okay to wait until next appt to have order if Dr. Catherine does not want to order now.  Please advise.

## 2023-12-12 NOTE — Patient Instructions (Addendum)
 Discussed with patient and spouse that skin prick testing and bloodwork (food IgE levels) checks for IgE mediated reactions which her clinical presentation does not support.   She does NOT need any type of allergy testing today. No dietary restrictions from allergy perspective. No need to avoid any foods.   Allergy: food allergy is when you have eaten a food, developed an allergic reaction after eating the food and have IgE to the food (positive food testing either by skin testing or blood testing).  Food allergy could lead to life threatening symptoms Sensitivity: occurs when you have IgE to a food (positive food testing either by skin testing or blood testing) but is a food you eat without any issues.  This is not an allergy and we recommend keeping the food in the diet Intolerance: this is when you have negative testing by either skin testing or blood testing thus not allergic but the food causes symptoms (like belly pain, bloating, diarrhea etc) with ingestion.  These foods should be avoided to prevent symptoms.      Follow up as needed. Follow up with PCP and rheumatology.  Skin care recommendations  Bath time: Always use lukewarm water. AVOID very hot or cold water. Keep bathing time to 5-10 minutes. Do NOT use bubble bath. Use a mild soap and use just enough to wash the dirty areas. Do NOT scrub skin vigorously.  After bathing, pat dry your skin with a towel. Do NOT rub or scrub the skin.  Moisturizers and prescriptions:  ALWAYS apply moisturizers immediately after bathing (within 3 minutes). This helps to lock-in moisture. Use the moisturizer several times a day over the whole body. Good summer moisturizers include: Aveeno, CeraVe, Cetaphil. Good winter moisturizers include: Aquaphor, Vaseline, Cerave, Cetaphil, Eucerin, Vanicream. When using moisturizers along with medications, the moisturizer should be applied about one hour after applying the medication to prevent diluting  effect of the medication or moisturize around where you applied the medications. When not using medications, the moisturizer can be continued twice daily as maintenance.  Laundry and clothing: Avoid laundry products with added color or perfumes. Use unscented hypo-allergenic laundry products such as Tide free, Cheer free & gentle, and All free and clear.  If the skin still seems dry or sensitive, you can try double-rinsing the clothes. Avoid tight or scratchy clothing such as wool. Do not use fabric softeners or dyer sheets.

## 2023-12-12 NOTE — Progress Notes (Signed)
 New Patient Note  RE: Cynthia Powell MRN: [id number] DOB: [birth year] Date of Office Visit: 12/12/2023  Consult requested by: Catherine Charlies LABOR, DO Primary care provider: Catherine Charlies LABOR, DO  Chief Complaint: Establish Care (Food allergy's as a child rice,corn spinach, blue fish and trout has avoided all life wants retested ( per spouce due to memory loss) some dry itchy skin. Diarrhea on /off x 2 months)  History of Present Illness: I had the pleasure of seeing Cynthia Powell for initial evaluation at the [facility] on 12/12/2023. She is a 78 y.o. female, who is referred here by Catherine Charlies A, DO for the evaluation of food allergies. She is accompanied today by her husband who provided/contributed to the history.   Discussed the use of AI scribe software for clinical note transcription with the patient, who gave verbal consent to proceed.     She has been experiencing persistent symptoms of weakness, dizziness, and a general sense of being 'out of alyce' for an extended period. In an effort to manage these symptoms, she has eliminated gluten, dairy, and soy from her diet based on her own research related to her Hashimoto's thyroiditis. Despite these dietary changes, her symptoms have not improved.  She has a history of allergies, with the last allergy test conducted in the 1960s, which indicated allergies to pines, lanolin, rice, corn, spinach, blue fish, and trout. However, she has not experienced any severe reactions to these foods, and her husband notes that she consumed rice and corn without issues for many years. She is concerned about her current diet and potential allergies, especially since she takes levothyroxine , which is made from corn.  She has been avoiding gluten, dairy, soy, rice, and corn for almost a year. She continues to eat eggs, peanuts, tree nuts, seafood, meats, fruits, and vegetables. She used to be semi-vegetarian but started eating more red meat for  nutrition.  She has a history of skin rashes since childhood and has had shingles multiple times despite vaccination. No history of asthma or frequent infections requiring antibiotics. She experiences cold intolerance and has lost significant weight, from approximately 120 pounds to 94-95 pounds over the last 18 months, attributed to reduced food intake and lack of exercise.  She takes levothyroxine  for her thyroid  and sporadically takes vitamin D , vitamin B12, and magnesium. She has not been taking blood pressure or cholesterol medications recently due to weight loss and dietary changes. She recently started Aricept .     Dietary History: patient has been eating other foods including eggs, peanut, treenuts, sesame, shellfish, fish, meats, fruits and vegetables.  Referral note: Patient was reported to have a corn, rice and dairy allergy any decades ago, and she would like to be allergy tested  Assessment and Plan: Cynthia Powell is a 78 y.o. female with: Dietary counseling and surveillance Food allergy evaluation Patient concerned about food allergies contributing to her variety of symptoms including Hashimoto's flare, weight loss, weakness, dizziness and not feeling well as in the 1960s she was tod she had allergies to rice, corn, spinach and fish. Denies any immediate IgE mediated symptoms with ingestion and per husband she used to eat these foods with no issues. Now avoiding gluten, dairy and soy due to concerns about these foods contributing to her Hashimoto's flare after researching on the internet.  Discussed with patient and spouse at length that skin prick testing and bloodwork (food IgE levels) checks for IgE mediated reactions which her clinical presentation does  not support.  She does NOT need any type of allergy testing today. No dietary restrictions from allergy perspective. No need to avoid any foods.   Allergy: food allergy is when you have eaten a food, developed an allergic reaction  after eating the food and have IgE to the food (positive food testing either by skin testing or blood testing).  Food allergy could lead to life threatening symptoms Sensitivity: occurs when you have IgE to a food (positive food testing either by skin testing or blood testing) but is a food you eat without any issues.  This is not an allergy and we recommend keeping the food in the diet Intolerance: this is when you have negative testing by either skin testing or blood testing thus not allergic but the food causes symptoms (like belly pain, bloating, diarrhea etc) with ingestion.  These foods should be avoided to prevent symptoms.      Xerosis cutis Complains of dry skin.  Provided information about skincare and moisturizing.   Return if symptoms worsen or fail to improve.  No orders of the defined types were placed in this encounter.  Lab Orders  No laboratory test(s) ordered today    Other allergy screening: Asthma: no Rhino conjunctivitis:  Currently not taking any antihistamines. Medication allergy: yes Hymenoptera allergy: no Urticaria: no Eczema: Skin rashes as a child. History of recurrent infections suggestive of immunodeficency: no  Diagnostics: None.   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 03/07/2022   Aortic stenosis: Only mild based on echocardiogram from 2023 02/27/2022   Mitral regurgitation only mild based on echocardiogram 2023 02/27/2022   Murmur, cardiac 02/02/2022   Amaurosis fugax 02/02/2022   Carotid artery calcification, bilateral- mild 05/01/2021   Enthesopathy of hip region 04/05/2020   B12 deficiency 04/05/2020   CKD (chronic kidney  disease) stage 3, GFR 30-59 ml/min (HCC) 11/11/2018   Vitamin D  deficiency 10/29/2017   Sensory hearing loss, 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   Uses hearing aid 03/08/2015   Osteoporosis 03/08/2015   Hypothyroidism 03/02/2015   Essential hypertension 03/02/2015   Hyperlipidemia 03/02/2015   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/30/2010   Past Medical History:  Diagnosis Date   Abnormal Pap smear of vagina [id number]   pt had h/o abnl pap; does not desire future screening/PAP   ADHD (attention deficit hyperactivity disorder), 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 Seems like a flare up started last year.     Herpes zoster 04/05/2020   A: given hx and physical findings as well as description of pain I do not think it is sciatica, pt also has vesicles on LE so may be that she is starting an outbreak P:--valtrex 1000 mg po tid x 7 d --fu if worsens or no better with meds   History  of chickenpox    History of colon polyps 05/16/2021   History of shingles    above her right knee, gets frequently.   Hyperlipidemia    using Krill oil; refuses statin   Hypertension    Murmur, cardiac 02/02/2022   Osteoporosis    Polyarthralgia 04/05/2020   Sensory hearing loss, bilateral 2017   Bilateral hearing aids. [facility]   Snoring 09/27/2020   Spider veins of both lower extremities 09/12/2015   Thyroid  disease 1980   Trigger finger    Wears hearing aid in both ears    Past Surgical History: Past Surgical History:  Procedure Laterality Date   BREAST SURGERY  1990   biopsy   CESAREAN SECTION     x2   COLONOSCOPY  05/16/2021   COLPOSCOPY     EYE SURGERY   cataracts removed 2015   TONSILLECTOMY  1954   TUBAL LIGATION  1973   Medication List:  Current Outpatient Medications  Medication Sig Dispense Refill   cholecalciferol (VITAMIN D ) 1000 units tablet Take 5,000 Units by mouth daily. (Patient not taking: Reported on 12/11/2023)     Cyanocobalamin  (VITAMIN B 12 PO) Take 1 tablet by mouth daily. (Patient not taking: Reported on 12/11/2023)     donepezil  (ARICEPT ) 10 MG tablet Take 1 tablet (10 mg total) by mouth at bedtime. 90 tablet 1   escitalopram  (LEXAPRO ) 10 MG tablet Take 1 tablet (10 mg total) by mouth daily. 90 tablet 1   levothyroxine  (SYNTHROID ) 50 MCG tablet Take 1 tablet (50 mcg total) by mouth daily before breakfast. 90 tablet 3   liothyronine  (CYTOMEL ) 5 MCG tablet Take 0.5 tablets (2.5 mcg total) by mouth daily. 45 tablet 3   No current facility-administered medications for this visit.   Allergies: Allergies  Allergen Reactions   Iodinated Contrast Media     Other reaction(s): UNCONSCIOUSNESS   Spinach    Corn-Containing Products Diarrhea   Fish Allergy Diarrhea    Pt c/o Bluefin tuna and trout causing diarrhea.   Rice Diarrhea   Social History: Social History   Socioeconomic History   Marital status: Married    Spouse name: Not on file   Number of children: Not on file   Years of education: Not on file   Highest education level: Bachelor's degree (e.g., BA, AB, BS)  Occupational History   Not on file  Tobacco Use   Smoking status: Never    Passive exposure: Past   Smokeless tobacco: Never   Tobacco comments:    Parents smoked a lot, I tried but quit when young  Vaping Use   Vaping status: Never Used  Substance and Sexual Activity   Alcohol use: Not Currently    Comment: rarely glass of wine a year but not for several years   Drug use: Never   Sexual activity: Yes    Birth control/protection: None    Comment: not needed  Other Topics Concern   Not on file  Social History Narrative   Not on file    Social Drivers of Health   Financial Resource Strain: Low Risk  (08/17/2023)   Overall Financial Resource Strain (CARDIA)    Difficulty of Paying Living Expenses: Not very hard  Food Insecurity: No Food Insecurity (08/17/2023)   Hunger Vital Sign    Worried About Running Out of Food in the Last Year: Never true    Ran Out of Food in the Last Year: Never true  Transportation Needs: No Transportation Needs (08/17/2023)  PRAPARE - Administrator, Civil Service (Medical): No    Lack of Transportation (Non-Medical): No  Physical Activity: Insufficiently Active (08/17/2023)   Exercise Vital Sign    Days of Exercise per Week: 5 days    Minutes of Exercise per Session: 20 min  Stress: No Stress Concern Present (08/17/2023)   [employer] of Occupational Health - Occupational Stress Questionnaire    Feeling of Stress: Only a little  Social Connections: Moderately Integrated (08/17/2023)   Social Connection and Isolation Panel    Frequency of Communication with Friends and Family: Three times a week    Frequency of Social Gatherings with Friends and Family: Three times a week    Attends Religious Services: 1 to 4 times per year    Active Member of Clubs or Organizations: No    Attends Banker Meetings: Not on file    Marital Status: Married   Lives in a house. Smoking: denies Occupation: retired  Landscape architect HistorySurveyor, minerals in the house: no Engineer, civil (consulting) in the family room: no Engineer, civil (consulting) in the bedroom: no Heating: electric Cooling: central Pet: no  Family History: Family History  Problem Relation Age of Onset   Arthritis Mother    Diabetes Mother    Stroke Mother        dementia after   Hypertension Mother    Fibromyalgia Mother    Varicose Veins Mother    Heart disease Father 16   Hypertension Father    Colon cancer Father        mets liver   Pernicious anemia Father    Cancer Father    Breast cancer Sister        breast cancer    Hearing loss Maternal Grandfather    Melanoma Daughter        melanoma 2009   Diabetes Daughter    ADD / ADHD Daughter    Anxiety disorder Daughter    Learning disabilities Daughter    Lymphoma Daughter        Brain mets- on chemo ([facility])   OCD Daughter    Mental illness Daughter    Anxiety disorder Daughter    Asthma Daughter    Cancer Daughter    ADD / ADHD Daughter    Cancer Daughter    Diabetes Daughter    Learning disabilities Daughter    Review of Systems  Constitutional:  Positive for appetite change, chills, fatigue and unexpected weight change. Negative for fever.  HENT:  Negative for congestion and rhinorrhea.   Eyes:  Negative for itching.  Respiratory:  Negative for cough, chest tightness, shortness of breath and wheezing.   Cardiovascular:  Negative for chest pain.  Gastrointestinal:  Negative for abdominal pain.  Genitourinary:  Negative for difficulty urinating.  Skin:  Positive for rash.    Objective: BP 106/70   Pulse 63   Temp 98 F (36.7 C) (Temporal)   Resp 20   Ht 5' (1.524 m)   Wt 97 lb 1.9 oz (44.1 kg)   SpO2 99%   BMI 18.97 kg/m  Body mass index is 18.97 kg/m. Physical Exam Vitals and nursing note reviewed.  Constitutional:      Appearance: She is well-developed.  HENT:     Head: Normocephalic and atraumatic.     Right Ear: Tympanic membrane and external ear normal.     Left Ear: Tympanic membrane and external ear normal.     Nose: Nose normal.     Mouth/Throat:  Mouth: Mucous membranes are moist.     Pharynx: Oropharynx is clear.  Eyes:     Conjunctiva/sclera: Conjunctivae normal.  Cardiovascular:     Rate and Rhythm: Normal rate and regular rhythm.     Heart sounds: Normal heart sounds. No murmur heard.    No friction rub. No gallop.  Pulmonary:     Effort: Pulmonary effort is normal.     Breath sounds: Normal breath sounds. No wheezing, rhonchi or rales.  Musculoskeletal:     Cervical back: Neck supple.  Skin:    General:  Skin is warm.     Findings: No rash.  Neurological:     Mental Status: She is alert.  Psychiatric:        Behavior: Behavior normal.    The plan was reviewed with the patient/family, and all questions/concerned were addressed.  It was my pleasure to see Anysha today and participate in her care. Please feel free to contact me with any questions or concerns.  Sincerely,  Orlan Cramp, DO Allergy & Immunology  [facility]  [facility] office: [phone] [facility] office: [phone]

## 2023-12-13 ENCOUNTER — Ambulatory Visit: Payer: Self-pay | Admitting: Rheumatology

## 2023-12-13 LAB — COMPREHENSIVE METABOLIC PANEL WITH GFR
AG Ratio: 1.8 (calc) (ref 1.0–2.5)
ALT: 10 U/L (ref 6–29)
AST: 17 U/L (ref 10–35)
Albumin: 4.3 g/dL (ref 3.6–5.1)
Alkaline phosphatase (APISO): 46 U/L (ref 37–153)
BUN: 19 mg/dL (ref 7–25)
CO2: 30 mmol/L (ref 20–32)
Calcium: 9.4 mg/dL (ref 8.6–10.4)
Chloride: 106 mmol/L (ref 98–110)
Creat: 0.95 mg/dL (ref 0.60–0.95)
Globulin: 2.4 g/dL (ref 1.9–3.7)
Glucose, Bld: 88 mg/dL (ref 65–99)
Potassium: 4.2 mmol/L (ref 3.5–5.3)
Sodium: 144 mmol/L (ref 135–146)
Total Bilirubin: 0.4 mg/dL (ref 0.2–1.2)
Total Protein: 6.7 g/dL (ref 6.1–8.1)
eGFR: 61 mL/min/1.73m2 (ref >=60)

## 2023-12-13 LAB — ANTI-DNA ANTIBODY, DOUBLE-STRANDED: ds DNA Ab: 41 [IU]/mL — ABNORMAL HIGH

## 2023-12-13 LAB — CBC WITH DIFFERENTIAL/PLATELET
Absolute Lymphocytes: 1928 {cells}/uL (ref 850–3900)
Absolute Monocytes: 598 {cells}/uL (ref 200–950)
Basophils Absolute: 43 {cells}/uL (ref 0–200)
Basophils Relative: 0.7 %
Eosinophils Absolute: 201 {cells}/uL (ref 15–500)
Eosinophils Relative: 3.3 %
HCT: 38.3 % (ref 35.0–45.0)
Hemoglobin: 12.4 g/dL (ref 11.7–15.5)
MCH: 29.7 pg (ref 27.0–33.0)
MCHC: 32.4 g/dL (ref 32.0–36.0)
MCV: 91.6 fL (ref 80.0–100.0)
MPV: 10.8 fL (ref 7.5–12.5)
Monocytes Relative: 9.8 %
Neutro Abs: 3331 {cells}/uL (ref 1500–7800)
Neutrophils Relative %: 54.6 %
Platelets: 199 Thousand/uL (ref 140–400)
RBC: 4.18 Million/uL (ref 3.80–5.10)
RDW: 11.6 % (ref 11.0–15.0)
Total Lymphocyte: 31.6 %
WBC: 6.1 Thousand/uL (ref 3.8–10.8)

## 2023-12-13 LAB — PROTEIN ELECTROPHORESIS, SERUM, WITH REFLEX
Albumin ELP: 4.1 g/dL (ref 3.8–4.8)
Alpha 1: 0.2 g/dL (ref 0.2–0.3)
Alpha 2: 0.7 g/dL (ref 0.5–0.9)
Beta 2: 0.3 g/dL (ref 0.2–0.5)
Beta Globulin: 0.4 g/dL (ref 0.4–0.6)
Gamma Globulin: 0.9 g/dL (ref 0.8–1.7)
Total Protein: 6.7 g/dL (ref 6.1–8.1)

## 2023-12-13 LAB — ANA: Anti Nuclear Antibody (ANA): NEGATIVE

## 2023-12-13 LAB — C3 AND C4
C3 Complement: 133 mg/dL (ref 83–193)
C4 Complement: 19 mg/dL (ref 15–57)

## 2023-12-13 LAB — PANCREATIC ELASTASE, FECAL: Pancreatic Elastase-1, Stool: >800 ug/g (ref >200)

## 2023-12-13 LAB — FECAL LEUKOCYTES
MICRO NUMBER:: 17084733
Result: NOT DETECTED
Specimen Quality: ADEQUATE

## 2023-12-13 LAB — PHOSPHORUS: Phosphorus: 2.9 mg/dL (ref 2.1–4.3)

## 2023-12-13 LAB — SEDIMENTATION RATE: Sed Rate: 6 mm/h (ref 0–30)

## 2023-12-13 LAB — PROTEIN / CREATININE RATIO, URINE
Creatinine, Urine: 254 mg/dL (ref 20–275)
Protein/Creat Ratio: 55 mg/g{creat} (ref 24–184)
Protein/Creatinine Ratio: 0.055 mg/mg{creat} (ref 0.024–0.184)
Total Protein, Urine: 14 mg/dL (ref 5–24)

## 2023-12-13 LAB — VITAMIN D 25 HYDROXY (VIT D DEFICIENCY, FRACTURES): Vit D, 25-Hydroxy: 63 ng/mL (ref 30–100)

## 2023-12-13 LAB — PARATHYROID HORMONE, INTACT (NO CA): PTH: 27 pg/mL (ref 16–77)

## 2023-12-13 NOTE — Progress Notes (Signed)
 Double-stranded DNA remains elevated and stable, urine protein creatinine ratio normal, CBC normal, CMP normal, complements normal, sed rate normal, vitamin D  normal, PTH normal, phosphorus normal.  ANA and SPEP pending.  Labs do not indicate an autoimmune disease flare.

## 2023-12-13 NOTE — Telephone Encounter (Signed)
Will discuss at her next visit

## 2023-12-14 NOTE — Progress Notes (Signed)
 Double-stranded DNA remains positive, urine protein creatinine ratio normal, CBC normal, CMP normal ANA negative, sed rate normal, C3-C4 normal, vitamin D  63 (in the desirable range), PTH normal, phosphorus normal, SPEP normal.  No change in treatment advised.  Please forward results to her PCP.

## 2024-01-21 ENCOUNTER — Ambulatory Visit: Admitting: Family Medicine

## 2024-01-21 VITALS — BP 116/68 | HR 61 | Temp 98.1°F | Wt 96.6 lb

## 2024-01-21 DIAGNOSIS — R7989 Other specified abnormal findings of blood chemistry: Secondary | ICD-10-CM

## 2024-01-21 DIAGNOSIS — E063 Autoimmune thyroiditis: Secondary | ICD-10-CM | POA: Diagnosis not present

## 2024-01-21 LAB — TSH: TSH: 1.53 u[IU]/mL (ref 0.35–5.50)

## 2024-01-21 LAB — T3, FREE: T3, Free: 2.5 pg/mL (ref 2.3–4.2)

## 2024-01-21 LAB — T4, FREE: Free T4: 0.91 ng/dL (ref 0.60–1.60)

## 2024-01-21 MED ORDER — DONEPEZIL HCL 10 MG PO TABS: 10.0000 mg | ORAL_TABLET | Freq: Every day | ORAL | 1 refills | Status: AC

## 2024-01-21 MED ORDER — ESCITALOPRAM OXALATE 10 MG PO TABS: 10.0000 mg | ORAL_TABLET | Freq: Every day | ORAL | 1 refills | Status: AC

## 2024-01-21 NOTE — Patient Instructions (Signed)

## 2024-01-21 NOTE — Progress Notes (Signed)
 Cynthia Powell , 1945/11/15, 78 y.o., female MRN: [id number] Patient Care Team    Relationship Specialty Notifications Start End  Catherine Charlies LABOR, DO PCP - General Family Medicine  03/02/15   Elma Zachary RAMAN  Optometry  09/30/18   [facility]  Audiology  09/30/18   Marvis Ronal BIRCH., MD Referring Physician Gastroenterology  10/11/21   Beryl Donnice BRAVO, MD Referring Physician Endocrinology  03/06/23   Dolphus Reiter, MD Consulting Physician Rheumatology  03/06/23   Bernie Lamar PARAS, MD Consulting Physician Cardiology  03/06/23   Gregg Lek, MD Consulting Physician Neurology  03/06/23     Chief Complaint  Patient presents with   Fatigue    Still c/o severe fatigue and diarrhea.      Subjective: Cynthia Powell is a 78 y.o. Pt presents for an OV to discuss her fatigue. Medication reconciliation completed Past medical history updated where appropriate. Patient reports she is still very fatigued.  She is still having 1 loose stool day, but is having normal stools once a day as well.  There is no associated foods from time a day where she has the loose stool in comparison to normal stool.  Her weight overall is stable. Last visit she had a mildly oversupplemented following, we decreased the Cytomel  to half a tab (2.5 mcg) daily continue the levothyroxine  at same dose.  Patient is taking medications correctly-confirmed with husband.  512Prior note: with complaints of diarrhea of 3-4 weeks duration.  Associated symptoms include fatigue. Patient has a significant medical history of thyroid  disorder and has had chronic fatigue. Patient presents today with her husband who also adds to the history.  Patient had 3 weeks of pretty bad diarrhea, she describes it as watery and multiple times a day.  Her husband states that she would be up in the middle of the night for few hours with diarrhea. Patient reports the diarrhea has slowed down for about a week but then would  intermittently return.  She states her bowel movements have not been back to normal or formed for over 4 weeks.  She reports she does not understand why her bowels changed because she did not change any of her diet.  She reports they were black and tarry, but she was taking Kaopectate at that time.  States since she stopped the Kaopectate they are black and tarry, but they are still darker brown than her usual. Patient reports she eats a dairy free, rice free and typically corn product free diet due to prior allergy testing. Of note, her thyroid  tests were finally in normal range 6 weeks ago, but coincidentally that is when she started more routinely taking the Cytomel  and diarrhea started. She is also on Aricept . There has been a 7 pound weight loss in 3 months. They would also like referral to allergist for allergy testing.  Pt denies exposure  to insects, recent travel, under prepared foods, antibiotics  or sick contacts.       05/30/2023    9:16 AM 09/12/2022    1:32 PM 05/16/2022    2:11 PM 02/02/2022    3:01 PM 10/11/2021    1:21 PM  Depression screen PHQ 2/9  Decreased Interest 0 0 0 0 0  Down, Depressed, Hopeless 0 0 0 0 0  PHQ - 2 Score 0 0 0 0 0  Altered sleeping 0 2     Tired, decreased energy 3 3  Change in appetite 0 1     Feeling bad or failure about yourself  0 0     Trouble concentrating 1 1     Moving slowly or fidgety/restless 1 0     Suicidal thoughts 0 0     PHQ-9 Score 5  7      Difficult doing work/chores Very difficult Somewhat difficult        Data saved with a previous flowsheet row definition    Allergies  Allergen Reactions   Iodinated Contrast Media     Other reaction(s): UNCONSCIOUSNESS   Spinach    Corn-Containing Products Diarrhea   Fish Allergy Diarrhea    Pt c/o Bluefin tuna and trout causing diarrhea.   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 Seems like a flare up started last year.     Herpes zoster 04/05/2020   A: given hx and physical findings as well as description of pain I do not think it is sciatica, pt also has vesicles on LE so may be that she is starting an outbreak P:--valtrex 1000 mg po tid x 7 d --fu if worsens or no better with meds   History of chickenpox    History of colon polyps 05/16/2021   History of shingles    above her right knee, gets frequently.   Hyperlipidemia    using Krill oil; refuses statin   Hypertension    Murmur, cardiac 02/02/2022   Osteoporosis    Polyarthralgia 04/05/2020   Sensory hearing loss, bilateral 2017   Bilateral hearing aids. [facility]   Snoring 09/27/2020   Spider veins of both lower extremities 09/12/2015   Thyroid  disease 1980   Trigger finger    Wears hearing aid in both ears    Past Surgical History:  Procedure Laterality Date   BREAST SURGERY  1990   biopsy   CESAREAN SECTION     x2   COLONOSCOPY  05/16/2021   COLPOSCOPY     EYE SURGERY  cataracts removed 2015   TONSILLECTOMY  1954   TUBAL LIGATION  1973   Family History  Problem Relation Age of Onset   Arthritis Mother    Diabetes Mother    Stroke Mother        dementia after   Hypertension Mother    Fibromyalgia Mother    Varicose Veins Mother    Heart disease Father 69   Hypertension Father    Colon cancer Father        mets liver   Pernicious anemia Father    Cancer Father    Breast cancer Sister         breast cancer   Hearing loss Maternal Grandfather    Melanoma Daughter        melanoma  2009   Diabetes Daughter    ADD / ADHD Daughter    Anxiety disorder Daughter    Learning disabilities Daughter    Lymphoma Daughter        Brain mets- on chemo ([facility])   OCD Daughter    Mental illness Daughter    Anxiety disorder Daughter    Asthma Daughter    Cancer Daughter    ADD / ADHD Daughter    Cancer Daughter    Diabetes Daughter    Learning disabilities Daughter    Allergies as of 01/21/2024       Reactions   Iodinated Contrast Media    Other reaction(s): UNCONSCIOUSNESS   Spinach    Corn-containing Products Diarrhea   Fish Allergy Diarrhea   Pt c/o Bluefin tuna and trout causing diarrhea.   Rice Diarrhea        Medication List        Accurate as of January 21, 2024  3:08 PM. If you have any questions, ask your nurse or doctor.          cholecalciferol 1000 units tablet Commonly known as: VITAMIN D  Take 5,000 Units by mouth daily.   donepezil  10 MG tablet Commonly known as: ARICEPT  Take 1 tablet (10 mg total) by mouth at bedtime.   escitalopram  10 MG tablet Commonly known as: Lexapro  Take 1 tablet (10 mg total) by mouth daily.   levothyroxine  50 MCG tablet Commonly known as: SYNTHROID  Take 1 tablet (50 mcg total) by mouth daily before breakfast.   liothyronine  5 MCG tablet Commonly known as: Cytomel  Take 0.5 tablets (2.5 mcg total) by mouth daily.   VITAMIN B 12 PO Take 1 tablet by mouth daily.        All past medical history, surgical history, allergies, family history, immunizations andmedications were updated in the EMR today and reviewed under the history and medication portions of their EMR.     ROS Negative, with the exception of above mentioned in HPI   Objective:  BP 116/68   Pulse 61   Temp 98.1 F (36.7 C)   Wt 96 lb 9.6 oz (43.8 kg)   SpO2 97%   BMI 18.87 kg/m  Body mass index is 18.87 kg/m. Physical Exam Vitals and  nursing note reviewed.  Constitutional:      General: She is not in acute distress.    Appearance: Normal appearance. She is not ill-appearing, toxic-appearing or diaphoretic.  HENT:     Head: Normocephalic and atraumatic.  Eyes:     General: No scleral icterus.       Right eye: No discharge.        Left eye: No discharge.     Extraocular Movements: Extraocular movements intact.     Conjunctiva/sclera: Conjunctivae normal.     Pupils: Pupils are equal, round, and reactive to light.  Cardiovascular:     Rate and Rhythm: Normal rate and regular rhythm.  Pulmonary:     Effort: Pulmonary effort is normal. No respiratory distress.     Breath sounds: Normal breath sounds. No wheezing, rhonchi or rales.  Abdominal:     General: Abdomen is flat and scaphoid. Bowel sounds are increased. There is no distension.     Palpations: Abdomen is soft. There is no mass.     Tenderness: There is no abdominal tenderness. There is no guarding or rebound.     Hernia: No hernia is present.  Musculoskeletal:     Right lower leg: No edema.  Left lower leg: No edema.  Skin:    General: Skin is warm.     Findings: No rash.  Neurological:     Mental Status: She is alert and oriented to person, place, and time. Mental status is at baseline.     Motor: No weakness.     Gait: Gait normal.  Psychiatric:        Mood and Affect: Mood normal.        Behavior: Behavior normal.        Thought Content: Thought content normal.        Judgment: Judgment normal.     No results found. No results found. No results found for this or any previous visit (from the past 24 hours).  Assessment/Plan: Cynthia Powell is a 78 y.o. female present for OV for  Diarrhea, unspecified type (Primary)/fatigue Weight is stable. Diarrhea may be secondary to Cytomel  or the Aricept . Colonoscopy was reassuring. Blood workup and stool collection was normal.  Hypothyroidism Continue levothyroxine  50 mcg daily as it is.  May need  to discontinue Cytomel  to 2.5 mcg daily (which is a half a tab daily).-She is still having loose stools.  Her weight is stable, but Cytomel  is not adding any benefit to her symptoms as she developed fatigue, not helping with memory and causing her stools. TSH, T4 free, T3 free collected today.  Reviewed expectations re: course of current medical issues. Discussed self-management of symptoms. Outlined signs and symptoms indicating need for more acute intervention. Patient verbalized understanding and all questions were answered. Patient received an After-Visit Summary.    Orders Placed This Encounter  Procedures   TSH   T4, free   T3, free   No orders of the defined types were placed in this encounter.  Referral Orders  No referral(s) requested today     Note is dictated utilizing voice recognition software. Although note has been proof read prior to signing, occasional typographical errors still can be missed. If any questions arise, please do not hesitate to call for verification.   electronically signed by:  Charlies Bellini, DO  [facility] [place]

## 2024-01-22 ENCOUNTER — Ambulatory Visit: Payer: Self-pay | Admitting: Family Medicine

## 2024-01-30 ENCOUNTER — Telehealth: Payer: Self-pay

## 2024-01-30 NOTE — Telephone Encounter (Signed)
 Communication  Reason for CRM: patient spouse would like a call back to confirm whether AWV scheduled for 02/19/24 is necessary considering everything that will be discussed was already discussed at her 01/21/24 visit.            Cynthia Powell    [phone]   Spoke with pt and pt husband. Advised the AWV was necessary and that it was an over-the-phone visit, so the pt does not have to come in office. They understood and had no further questions.

## 2024-02-19 ENCOUNTER — Ambulatory Visit: Admitting: *Deleted

## 2024-02-19 DIAGNOSIS — Z0000 Encounter for general adult medical examination without abnormal findings: Secondary | ICD-10-CM

## 2024-02-19 NOTE — Patient Instructions (Signed)
 Cynthia Powell,  Thank you for taking the time for your Medicare Wellness Visit. I appreciate your continued commitment to your health goals. Please review the care plan we discussed, and feel free to reach out if I can assist you further.  Please note that Annual Wellness Visits do not include a physical exam. Some assessments may be limited, especially if the visit was conducted virtually. If needed, we may recommend an in-person follow-up with your provider.  Ongoing Care Seeing your primary care provider every 3 to 6 months helps us  monitor your health and provide consistent, personalized care.   Referrals If a referral was made during today's visit and you haven't received any updates within two weeks, please contact the referred provider directly to check on the status.  Recommended Screenings:  Health Maintenance  Topic Date Due   Medicare Annual Wellness Visit  02/18/2025   Colon Cancer Screening  06/30/2026   DTaP/Tdap/Td vaccine (3 - Td or Tdap) 10/01/2028   Pneumococcal Vaccine for age over 30  Completed   Flu Shot  Completed   Osteoporosis screening with Bone Density Scan  Completed   Zoster (Shingles) Vaccine  Completed   Meningitis B Vaccine  Aged Out   COVID-19 Vaccine  Discontinued   Hepatitis C Screening  Discontinued       02/19/2024    9:47 AM  Advanced Directives  Does Patient Have a Medical Advance Directive? Yes  Type of Advance Directive Healthcare Power of Attorney  Copy of Healthcare Power of Attorney in Chart? No - copy requested    Vision: Annual vision screenings are recommended for early detection of glaucoma, cataracts, and diabetic retinopathy. These exams can also reveal signs of chronic conditions such as diabetes and high blood pressure.  Dental: Annual dental screenings help detect early signs of oral cancer, gum disease, and other conditions linked to overall health, including heart disease and diabetes.  Please see the attached documents for  additional preventive care recommendations.    Cynthia Powell , Thank you for taking time to come for your Medicare Wellness Visit. I appreciate your ongoing commitment to your health goals. Please review the following plan we discussed and let me know if I can assist you in the future.   Screening recommendations/referrals: Colonoscopy:  Mammogram:  Bone Density:  Recommended yearly ophthalmology/optometry visit for glaucoma screening and checkup Recommended yearly dental visit for hygiene and checkup  Vaccinations: Influenza vaccine:  Pneumococcal vaccine:  Tdap vaccine:  Shingles vaccine:     Preventive Care 65 Years and Older, Female Preventive care refers to lifestyle choices and visits with your health care provider that can promote health and wellness. What does preventive care include? A yearly physical exam. This is also called an annual well check. Dental exams once or twice a year. Routine eye exams. Ask your health care provider how often you should have your eyes checked. Personal lifestyle choices, including: Daily care of your teeth and gums. Regular physical activity. Eating a healthy diet. Avoiding tobacco and drug use. Limiting alcohol use. Practicing safe sex. Taking low-dose aspirin  every day. Taking vitamin and mineral supplements as recommended by your health care provider. What happens during an annual well check? The services and screenings done by your health care provider during your annual well check will depend on your age, overall health, lifestyle risk factors, and family history of disease. Counseling  Your health care provider may ask you questions about your: Alcohol use. Tobacco use. Drug use. Emotional well-being. Home  and relationship well-being. Sexual activity. Eating habits. History of falls. Memory and ability to understand (cognition). Work and work astronomer. Reproductive health. Screening  You may have the following tests or  measurements: Height, weight, and BMI. Blood pressure. Lipid and cholesterol levels. These may be checked every 5 years, or more frequently if you are over 53 years old. Skin check. Lung cancer screening. You may have this screening every year starting at age 5 if you have a 30-pack-year history of smoking and currently smoke or have quit within the past 15 years. Fecal occult blood test (FOBT) of the stool. You may have this test every year starting at age 39. Flexible sigmoidoscopy or colonoscopy. You may have a sigmoidoscopy every 5 years or a colonoscopy every 10 years starting at age 34. Hepatitis C blood test. Hepatitis B blood test. Sexually transmitted disease (STD) testing. Diabetes screening. This is done by checking your blood sugar (glucose) after you have not eaten for a while (fasting). You may have this done every 1-3 years. Bone density scan. This is done to screen for osteoporosis. You may have this done starting at age 51. Mammogram. This may be done every 1-2 years. Talk to your health care provider about how often you should have regular mammograms. Talk with your health care provider about your test results, treatment options, and if necessary, the need for more tests. Vaccines  Your health care provider may recommend certain vaccines, such as: Influenza vaccine. This is recommended every year. Tetanus, diphtheria, and acellular pertussis (Tdap, Td) vaccine. You may need a Td booster every 10 years. Zoster vaccine. You may need this after age 47. Pneumococcal 13-valent conjugate (PCV13) vaccine. One dose is recommended after age 71. Pneumococcal polysaccharide (PPSV23) vaccine. One dose is recommended after age 34. Talk to your health care provider about which screenings and vaccines you need and how often you need them. This information is not intended to replace advice given to you by your health care provider. Make sure you discuss any questions you have with your  health care provider. Document Released: 03/11/2015 Document Revised: 11/02/2015 Document Reviewed: 12/14/2014 Elsevier Interactive Patient Education  2017 [employer].  Fall Prevention in the Home Falls can cause injuries. They can happen to people of all ages. There are many things you can do to make your home safe and to help prevent falls. What can I do on the outside of my home? Regularly fix the edges of walkways and driveways and fix any cracks. Remove anything that might make you trip as you walk through a door, such as a raised step or threshold. Trim any bushes or trees on the path to your home. Use bright outdoor lighting. Clear any walking paths of anything that might make someone trip, such as rocks or tools. Regularly check to see if handrails are loose or broken. Make sure that both sides of any steps have handrails. Any raised decks and porches should have guardrails on the edges. Have any leaves, snow, or ice cleared regularly. Use sand or salt on walking paths during winter. Clean up any spills in your garage right away. This includes oil or grease spills. What can I do in the bathroom? Use night lights. Install grab bars by the toilet and in the tub and shower. Do not use towel bars as grab bars. Use non-skid mats or decals in the tub or shower. If you need to sit down in the shower, use a plastic, non-slip stool. Keep the floor  dry. Clean up any water that spills on the floor as soon as it happens. Remove soap buildup in the tub or shower regularly. Attach bath mats securely with double-sided non-slip rug tape. Do not have throw rugs and other things on the floor that can make you trip. What can I do in the bedroom? Use night lights. Make sure that you have a light by your bed that is easy to reach. Do not use any sheets or blankets that are too big for your bed. They should not hang down onto the floor. Have a firm chair that has side arms. You can use this for  support while you get dressed. Do not have throw rugs and other things on the floor that can make you trip. What can I do in the kitchen? Clean up any spills right away. Avoid walking on wet floors. Keep items that you use a lot in easy-to-reach places. If you need to reach something above you, use a strong step stool that has a grab bar. Keep electrical cords out of the way. Do not use floor polish or wax that makes floors slippery. If you must use wax, use non-skid floor wax. Do not have throw rugs and other things on the floor that can make you trip. What can I do with my stairs? Do not leave any items on the stairs. Make sure that there are handrails on both sides of the stairs and use them. Fix handrails that are broken or loose. Make sure that handrails are as long as the stairways. Check any carpeting to make sure that it is firmly attached to the stairs. Fix any carpet that is loose or worn. Avoid having throw rugs at the top or bottom of the stairs. If you do have throw rugs, attach them to the floor with carpet tape. Make sure that you have a light switch at the top of the stairs and the bottom of the stairs. If you do not have them, ask someone to add them for you. What else can I do to help prevent falls? Wear shoes that: Do not have high heels. Have rubber bottoms. Are comfortable and fit you well. Are closed at the toe. Do not wear sandals. If you use a stepladder: Make sure that it is fully opened. Do not climb a closed stepladder. Make sure that both sides of the stepladder are locked into place. Ask someone to hold it for you, if possible. Clearly mark and make sure that you can see: Any grab bars or handrails. First and last steps. Where the edge of each step is. Use tools that help you move around (mobility aids) if they are needed. These include: Canes. Walkers. Scooters. Crutches. Turn on the lights when you go into a dark area. Replace any light bulbs as soon  as they burn out. Set up your furniture so you have a clear path. Avoid moving your furniture around. If any of your floors are uneven, fix them. If there are any pets around you, be aware of where they are. Review your medicines with your doctor. Some medicines can make you feel dizzy. This can increase your chance of falling. Ask your doctor what other things that you can do to help prevent falls. This information is not intended to replace advice given to you by your health care provider. Make sure you discuss any questions you have with your health care provider. Document Released: 12/09/2008 Document Revised: 07/21/2015 Document Reviewed: 03/19/2014 Elsevier Interactive Patient  Education  2017 [employer].

## 2024-02-19 NOTE — Progress Notes (Signed)
 "  Chief Complaint  Patient presents with   Medicare Wellness     Subjective:   Cynthia Powell is a 78 y.o. female who presents for a Medicare Annual Wellness Visit.  No voiced or noted concerns at this time   Visit info / Clinical Intake: Medicare Wellness Visit Type:: Subsequent Annual Wellness Visit Persons participating in visit and providing information:: patient Medicare Wellness Visit Mode:: Telephone If telephone:: video declined Since this visit was completed virtually, some vitals may be partially provided or unavailable. Missing vitals are due to the limitations of the virtual format.: Unable to obtain vitals - no equipment If Telephone or Video please confirm:: I connected with patient using audio/video enable telemedicine. I verified patient identity with two identifiers, discussed telehealth limitations, and patient agreed to proceed. Patient Location:: home Provider Location:: home Interpreter Needed?: No Pre-visit prep was completed: no AWV questionnaire completed by patient prior to visit?: yes Date:: 02/18/24 Living arrangements:: lives with spouse/significant other Patient's Overall Health Status Rating: (!) fair Typical amount of pain: some Does pain affect daily life?: no  Dietary Habits and Nutritional Risks How many meals a day?: 3 Eats fruit and vegetables daily?: yes Most meals are obtained by: preparing own meals In the last 2 weeks, have you had any of the following?: none Diabetic:: no  Functional Status Activities of Daily Living (to include ambulation/medication): Independent Ambulation: Independent with device- listed below Medication Administration: Independent Home Management (perform basic housework or laundry): Needs assistance (comment) Manage your own finances?: (!) no Primary transportation is: family / friends Concerns about vision?: no *vision screening is required for WTM* Concerns about hearing?: (!) yes Uses hearing aids?: (!)  yes Hear whispered voice?: (!) no *in-person visit only*  Fall Screening Falls in the past year?: 0 Number of falls in past year: 0 Was there an injury with Fall?: 0 Fall Risk Category Calculator: 0 Patient Fall Risk Level: Low Fall Risk  Fall Risk Patient at Risk for Falls Due to: No Fall Risks Fall risk Follow up: Falls evaluation completed; Education provided; Falls prevention discussed  Home and Transportation Safety: All rugs have non-skid backing?: yes All stairs or steps have railings?: yes Grab bars in the bathtub or shower?: yes Have non-skid surface in bathtub or shower?: yes Good home lighting?: yes Regular seat belt use?: yes Hospital stays in the last year:: no  Cognitive Assessment Difficulty concentrating, remembering, or making decisions? : yes Will 6CIT or Mini Cog be Completed: yes What year is it?: 0 points What month is it?: 0 points Give patient an address phrase to remember (5 components): Its very sunny outside today in December About what time is it?: 0 points Count backwards from 20 to 1: 0 points Say the months of the year in reverse: 0 points Repeat the address phrase from earlier: 8 points 6 CIT Score: 8 points  Advance Directives (For Healthcare) Does Patient Have a Medical Advance Directive?: Yes Type of Advance Directive: Healthcare Power of Attorney Copy of Healthcare Power of Attorney in Chart?: No - copy requested  Reviewed/Updated  Reviewed/Updated: Reviewed All (Medical, Surgical, Family, Medications, Allergies, Care Teams, Patient Goals); Surgical History; Family History; Medications; Allergies; Care Teams; Patient Goals; Medical History    Allergies (verified) Iodinated contrast media, Spinach, Corn-containing products, Fish allergy, and Rice   Current Medications (verified) Outpatient Encounter Medications as of 02/19/2024  Medication Sig   donepezil  (ARICEPT ) 10 MG tablet Take 1 tablet (10 mg total) by mouth at bedtime.  escitalopram  (LEXAPRO ) 10 MG tablet Take 1 tablet (10 mg total) by mouth daily.   levothyroxine  (SYNTHROID ) 50 MCG tablet Take 1 tablet (50 mcg total) by mouth daily before breakfast.   No facility-administered encounter medications on file as of 02/19/2024.    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 Seems like a flare up started last year.     Herpes zoster 04/05/2020   A: given hx and physical findings as well as description of pain I do not think it is sciatica, pt also has vesicles on LE so may be that she is starting an outbreak P:--valtrex 1000 mg po tid x 7 d --fu if worsens or no better with meds   History of chickenpox    History of colon polyps 05/16/2021   History of shingles    above her right knee, gets frequently.   Hyperlipidemia    using Krill oil; refuses statin   Hypertension    Murmur, cardiac 02/02/2022   Osteoporosis    Polyarthralgia 04/05/2020   Sensory hearing loss, bilateral 2017   Bilateral hearing aids. [facility]   Snoring 09/27/2020   Spider veins of both lower extremities 09/12/2015   Thyroid  disease 1980   Trigger finger    Wears hearing aid in both ears    Past Surgical History:  Procedure Laterality Date   BREAST SURGERY  1990   biopsy   CESAREAN SECTION     x2   COLONOSCOPY  05/16/2021   COLPOSCOPY     EYE SURGERY  cataracts removed 2015   TONSILLECTOMY  1954   TUBAL LIGATION  1973   Family History  Problem  Relation Age of Onset   Arthritis Mother    Diabetes Mother    Stroke Mother        dementia after   Hypertension Mother    Fibromyalgia Mother    Varicose Veins Mother    Heart disease Father 45   Hypertension Father    Colon cancer Father        mets liver   Pernicious anemia Father    Cancer Father    Breast cancer Sister        breast cancer   Hearing loss Maternal Grandfather    Melanoma Daughter        melanoma 2009   Diabetes Daughter    ADD / ADHD Daughter    Anxiety disorder Daughter    Learning disabilities Daughter    Lymphoma Daughter        Brain mets- on chemo ([facility])   OCD Daughter    Mental illness Daughter    Anxiety disorder Daughter    Asthma Daughter    Cancer Daughter    ADD / ADHD Daughter    Cancer Daughter    Diabetes Daughter    Learning disabilities Daughter    Social History   Occupational  History   Not on file  Tobacco Use   Smoking status: Never    Passive exposure: Past   Smokeless tobacco: Never   Tobacco comments:    Parents smoked a lot, I tried but quit when young  Vaping Use   Vaping status: Never Used  Substance and Sexual Activity   Alcohol use: Not Currently    Comment: rarely glass of wine a year but not for several years   Drug use: Never   Sexual activity: Yes    Birth control/protection: None    Comment: not needed   Tobacco Counseling Counseling given: Not Answered Tobacco comments: Parents smoked a lot, I tried but quit when young  SDOH Screenings   Food Insecurity: No Food Insecurity (02/19/2024)  Housing: Low Risk (02/19/2024)  Transportation Needs: No Transportation Needs (02/19/2024)  Utilities: Not At Risk (02/19/2024)  Alcohol Screen: Low Risk (05/12/2021)  Depression (PHQ2-9): High Risk (02/19/2024)  Financial Resource Strain: Low Risk (01/14/2024)  Physical Activity: Inactive (02/19/2024)  Social Connections: Moderately Isolated (02/19/2024)  Stress: No Stress Concern Present (02/19/2024)  Tobacco  Use: Low Risk (02/19/2024)  Health Literacy: Inadequate Health Literacy (02/19/2024)   See flowsheets for full screening details  Depression Screen PHQ 2 & 9 Depression Scale- Over the past 2 weeks, how often have you been bothered by any of the following problems? Little interest or pleasure in doing things: 0 Feeling down, depressed, or hopeless (PHQ Adolescent also includes...irritable): 3 PHQ-2 Total Score: 3 Trouble falling or staying asleep, or sleeping too much: 3 Feeling tired or having little energy: 3 Poor appetite or overeating (PHQ Adolescent also includes...weight loss): 0 Feeling bad about yourself - or that you are a failure or have let yourself or your family down: 0 Trouble concentrating on things, such as reading the newspaper or watching television (PHQ Adolescent also includes...like school work): 3 Moving or speaking so slowly that other people could have noticed. Or the opposite - being so fidgety or restless that you have been moving around a lot more than usual: 0 Thoughts that you would be better off dead, or of hurting yourself in some way: 0 PHQ-9 Total Score: 12 If you checked off any problems, how difficult have these problems made it for you to do your work, take care of things at home, or get along with other people?: Not difficult at all     Goals Addressed             This Visit's Progress    Patient Stated   On track    Maintain current health.     Patient Stated       Maintain current lifestyle             Objective:    There were no vitals filed for this visit. There is no height or weight on file to calculate BMI.  Hearing/Vision screen Hearing Screening - Comments:: Bilateral hearing aids Vision Screening - Comments:: Up to date wood Immunizations and Health Maintenance Health Maintenance  Topic Date Due   Medicare Annual Wellness (AWV)  02/18/2025   Colonoscopy  06/30/2026   DTaP/Tdap/Td (3 - Td or Tdap) 10/01/2028    Pneumococcal Vaccine: 50+ Years  Completed   Influenza Vaccine  Completed   Bone Density Scan  Completed   Zoster Vaccines- Shingrix   Completed   Meningococcal B Vaccine  Aged Out   COVID-19 Vaccine  Discontinued   Hepatitis C Screening  Discontinued  Assessment/Plan:  This is a routine wellness examination for Cynthia Powell.  Patient Care Team: Catherine Charlies LABOR, DO as PCP - General (Family Medicine) Elma Zachary [facility] (Audiology) Marvis Ronal BIRCH., MD as Referring Physician (Gastroenterology) Beryl Donnice BRAVO, MD as Referring Physician (Endocrinology) Dolphus Reiter, MD as Consulting Physician (Rheumatology) Bernie Lamar PARAS, MD as Consulting Physician (Cardiology) Gregg Lek, MD as Consulting Physician (Neurology)  I have personally reviewed and noted the following in the patients chart:   Medical and social history Use of alcohol, tobacco or illicit drugs  Current medications and supplements including opioid prescriptions. Functional ability and status Nutritional status Physical activity Advanced directives List of other physicians Hospitalizations, surgeries, and ER visits in previous 12 months Vitals Screenings to include cognitive, depression, and falls Referrals and appointments  No orders of the defined types were placed in this encounter.  In addition, I have reviewed and discussed with patient certain preventive protocols, quality metrics, and best practice recommendations. A written personalized care plan for preventive services as well as general preventive health recommendations were provided to patient.   Mliss Graff, LPN   [id number]   Return in 1 year (on 02/18/2025).  After Visit Summary: (MyChart) Due to this being a telephonic visit, the after visit summary with patients personalized plan was offered to patient via MyChart   Nurse Notes:  "

## 2024-03-30 ENCOUNTER — Ambulatory Visit: Admitting: Family Medicine

## 2024-04-07 ENCOUNTER — Ambulatory Visit: Admitting: Family Medicine

## 2024-12-15 ENCOUNTER — Ambulatory Visit: Admitting: Rheumatology
